# Patient Record
Sex: Male | Born: 1935 | Race: White | Hispanic: No | Smoking: Never smoker
Health system: Southern US, Community
[De-identification: ages and names within clinical notes are randomized; demographics above are authoritative.]

## PROBLEM LIST (undated history)

## (undated) DIAGNOSIS — E039 Hypothyroidism, unspecified: Secondary | ICD-10-CM

## (undated) DIAGNOSIS — I951 Orthostatic hypotension: Secondary | ICD-10-CM

## (undated) DIAGNOSIS — I10 Essential (primary) hypertension: Secondary | ICD-10-CM

## (undated) DIAGNOSIS — D759 Disease of blood and blood-forming organs, unspecified: Secondary | ICD-10-CM

## (undated) DIAGNOSIS — F0390 Unspecified dementia without behavioral disturbance: Secondary | ICD-10-CM

## (undated) DIAGNOSIS — K2970 Gastritis, unspecified, without bleeding: Secondary | ICD-10-CM

## (undated) DIAGNOSIS — J189 Pneumonia, unspecified organism: Secondary | ICD-10-CM

## (undated) DIAGNOSIS — C801 Malignant (primary) neoplasm, unspecified: Secondary | ICD-10-CM

## (undated) DIAGNOSIS — Z7901 Long term (current) use of anticoagulants: Secondary | ICD-10-CM

## (undated) DIAGNOSIS — N400 Enlarged prostate without lower urinary tract symptoms: Secondary | ICD-10-CM

## (undated) DIAGNOSIS — I4891 Unspecified atrial fibrillation: Secondary | ICD-10-CM

## (undated) DIAGNOSIS — R0602 Shortness of breath: Secondary | ICD-10-CM

## (undated) HISTORY — PX: HERNIA REPAIR: SHX51

## (undated) HISTORY — DX: Essential (primary) hypertension: I10

## (undated) HISTORY — DX: Orthostatic hypotension: I95.1

## (undated) HISTORY — PX: KNEE SURGERY: SHX244

## (undated) HISTORY — PX: APPENDECTOMY: SHX54

## (undated) HISTORY — DX: Unspecified atrial fibrillation: I48.91

## (undated) HISTORY — DX: Benign prostatic hyperplasia without lower urinary tract symptoms: N40.0

## (undated) HISTORY — DX: Long term (current) use of anticoagulants: Z79.01

## (undated) HISTORY — PX: MIDDLE EAR SURGERY: SHX713

## (undated) HISTORY — DX: Gastritis, unspecified, without bleeding: K29.70

## (undated) HISTORY — DX: Malignant (primary) neoplasm, unspecified: C80.1

## (undated) HISTORY — DX: Hypothyroidism, unspecified: E03.9

---

## 1997-06-24 ENCOUNTER — Other Ambulatory Visit: Admission: RE | Admit: 1997-06-24 | Discharge: 1997-06-24 | Payer: Self-pay | Admitting: Cardiology

## 2003-09-04 HISTORY — PX: CARDIOVASCULAR STRESS TEST: SHX262

## 2004-04-12 ENCOUNTER — Encounter: Payer: Self-pay | Admitting: Cardiology

## 2004-04-12 ENCOUNTER — Observation Stay (HOSPITAL_COMMUNITY): Admission: AD | Admit: 2004-04-12 | Discharge: 2004-04-13 | Payer: Self-pay | Admitting: Internal Medicine

## 2004-04-12 ENCOUNTER — Ambulatory Visit: Payer: Self-pay | Admitting: Internal Medicine

## 2004-11-11 ENCOUNTER — Ambulatory Visit: Payer: Self-pay | Admitting: Internal Medicine

## 2004-11-18 ENCOUNTER — Ambulatory Visit: Payer: Self-pay | Admitting: Internal Medicine

## 2004-11-22 ENCOUNTER — Ambulatory Visit: Payer: Self-pay | Admitting: Internal Medicine

## 2004-11-23 ENCOUNTER — Ambulatory Visit: Payer: Self-pay | Admitting: Internal Medicine

## 2004-11-24 ENCOUNTER — Ambulatory Visit: Payer: Self-pay | Admitting: Internal Medicine

## 2004-12-09 ENCOUNTER — Ambulatory Visit: Payer: Self-pay | Admitting: Gastroenterology

## 2004-12-15 ENCOUNTER — Encounter (INDEPENDENT_AMBULATORY_CARE_PROVIDER_SITE_OTHER): Payer: Self-pay | Admitting: *Deleted

## 2004-12-15 ENCOUNTER — Ambulatory Visit: Payer: Self-pay | Admitting: Gastroenterology

## 2005-01-04 ENCOUNTER — Ambulatory Visit: Payer: Self-pay | Admitting: Gastroenterology

## 2006-01-04 ENCOUNTER — Ambulatory Visit: Payer: Self-pay | Admitting: Internal Medicine

## 2006-01-04 LAB — CONVERTED CEMR LAB
ALT: 5 units/L (ref 0–40)
AST: 18 units/L (ref 0–37)
Basophils Absolute: 0 10*3/uL (ref 0.0–0.1)
Creatinine, Ser: 1.1 mg/dL (ref 0.4–1.5)
HCT: 42.5 % (ref 39.0–52.0)
Hemoglobin: 14.3 g/dL (ref 13.0–17.0)
Hgb A1c MFr Bld: 5.4 % (ref 4.6–6.0)
MCV: 94 fL (ref 78.0–100.0)
Monocytes Relative: 9.6 % (ref 3.0–11.0)
RBC: 4.52 M/uL (ref 4.22–5.81)
RDW: 12.4 % (ref 11.5–14.6)

## 2006-02-15 ENCOUNTER — Encounter: Admission: RE | Admit: 2006-02-15 | Discharge: 2006-05-16 | Payer: Self-pay | Admitting: Internal Medicine

## 2006-04-17 ENCOUNTER — Ambulatory Visit: Payer: Self-pay | Admitting: Internal Medicine

## 2006-10-11 ENCOUNTER — Encounter: Payer: Self-pay | Admitting: Internal Medicine

## 2007-01-01 ENCOUNTER — Ambulatory Visit: Payer: Self-pay | Admitting: Internal Medicine

## 2007-01-01 DIAGNOSIS — J309 Allergic rhinitis, unspecified: Secondary | ICD-10-CM | POA: Insufficient documentation

## 2007-01-01 DIAGNOSIS — E875 Hyperkalemia: Secondary | ICD-10-CM | POA: Insufficient documentation

## 2007-01-01 DIAGNOSIS — Z87898 Personal history of other specified conditions: Secondary | ICD-10-CM

## 2007-01-01 DIAGNOSIS — G20 Parkinson's disease: Secondary | ICD-10-CM

## 2007-01-01 DIAGNOSIS — R42 Dizziness and giddiness: Secondary | ICD-10-CM

## 2007-04-11 ENCOUNTER — Encounter: Payer: Self-pay | Admitting: Internal Medicine

## 2007-10-10 ENCOUNTER — Encounter: Payer: Self-pay | Admitting: Internal Medicine

## 2008-03-05 ENCOUNTER — Ambulatory Visit: Payer: Self-pay | Admitting: Internal Medicine

## 2008-03-05 DIAGNOSIS — R1032 Left lower quadrant pain: Secondary | ICD-10-CM

## 2008-03-05 LAB — CONVERTED CEMR LAB
Glucose, Urine, Semiquant: NEGATIVE
Nitrite: NEGATIVE
Urobilinogen, UA: 0.2
WBC Urine, dipstick: NEGATIVE
pH: 6

## 2008-04-10 ENCOUNTER — Encounter: Payer: Self-pay | Admitting: Internal Medicine

## 2008-10-09 ENCOUNTER — Encounter: Payer: Self-pay | Admitting: Internal Medicine

## 2008-11-03 ENCOUNTER — Ambulatory Visit: Payer: Self-pay | Admitting: Internal Medicine

## 2008-11-03 DIAGNOSIS — Z85828 Personal history of other malignant neoplasm of skin: Secondary | ICD-10-CM

## 2008-11-03 DIAGNOSIS — E039 Hypothyroidism, unspecified: Secondary | ICD-10-CM | POA: Insufficient documentation

## 2008-11-03 DIAGNOSIS — G479 Sleep disorder, unspecified: Secondary | ICD-10-CM | POA: Insufficient documentation

## 2008-11-03 DIAGNOSIS — Z860100 Personal history of colon polyps, unspecified: Secondary | ICD-10-CM | POA: Insufficient documentation

## 2008-11-03 DIAGNOSIS — R634 Abnormal weight loss: Secondary | ICD-10-CM | POA: Insufficient documentation

## 2008-11-03 DIAGNOSIS — N400 Enlarged prostate without lower urinary tract symptoms: Secondary | ICD-10-CM | POA: Insufficient documentation

## 2008-11-03 DIAGNOSIS — I4891 Unspecified atrial fibrillation: Secondary | ICD-10-CM

## 2008-11-04 ENCOUNTER — Ambulatory Visit: Payer: Self-pay | Admitting: Internal Medicine

## 2008-11-04 LAB — CONVERTED CEMR LAB
ALT: 18 units/L (ref 0–53)
Albumin: 4.1 g/dL (ref 3.5–5.2)
Alkaline Phosphatase: 54 units/L (ref 39–117)
Basophils Relative: 0.1 % (ref 0.0–3.0)
Bilirubin, Direct: 0.3 mg/dL (ref 0.0–0.3)
Calcium: 9.1 mg/dL (ref 8.4–10.5)
Cholesterol: 176 mg/dL (ref 0–200)
GFR calc non Af Amer: 56.93 mL/min (ref >60)
Glucose, Bld: 101 mg/dL — ABNORMAL HIGH (ref 70–99)
Hemoglobin: 15.9 g/dL (ref 13.0–17.0)
LDL Cholesterol: 108 mg/dL — ABNORMAL HIGH (ref 0–99)
Lymphocytes Relative: 10.8 % — ABNORMAL LOW (ref 12.0–46.0)
Platelets: 171 10*3/uL (ref 150.0–400.0)
RDW: 12 % (ref 11.5–14.6)
Sodium: 145 meq/L (ref 135–145)
TSH: 1.53 microintl units/mL (ref 0.35–5.50)
Total Bilirubin: 2.4 mg/dL — ABNORMAL HIGH (ref 0.3–1.2)
Total Protein: 7.2 g/dL (ref 6.0–8.3)
Triglycerides: 71 mg/dL (ref 0.0–149.0)
VLDL: 14.2 mg/dL (ref 0.0–40.0)

## 2008-11-05 ENCOUNTER — Telehealth (INDEPENDENT_AMBULATORY_CARE_PROVIDER_SITE_OTHER): Payer: Self-pay | Admitting: *Deleted

## 2008-11-18 ENCOUNTER — Ambulatory Visit: Payer: Self-pay | Admitting: Internal Medicine

## 2008-11-18 LAB — CONVERTED CEMR LAB: OCCULT 1: NEGATIVE

## 2008-11-19 ENCOUNTER — Encounter (INDEPENDENT_AMBULATORY_CARE_PROVIDER_SITE_OTHER): Payer: Self-pay | Admitting: *Deleted

## 2008-11-25 ENCOUNTER — Ambulatory Visit: Payer: Self-pay | Admitting: Internal Medicine

## 2008-11-26 ENCOUNTER — Encounter: Payer: Self-pay | Admitting: Internal Medicine

## 2009-06-08 ENCOUNTER — Ambulatory Visit: Payer: Self-pay | Admitting: Internal Medicine

## 2009-06-09 ENCOUNTER — Telehealth (INDEPENDENT_AMBULATORY_CARE_PROVIDER_SITE_OTHER): Payer: Self-pay | Admitting: *Deleted

## 2009-06-09 ENCOUNTER — Encounter: Payer: Self-pay | Admitting: Internal Medicine

## 2009-06-11 ENCOUNTER — Telehealth (INDEPENDENT_AMBULATORY_CARE_PROVIDER_SITE_OTHER): Payer: Self-pay | Admitting: *Deleted

## 2009-06-11 LAB — CONVERTED CEMR LAB
BUN: 15 mg/dL (ref 6–23)
Bilirubin, Direct: 0.2 mg/dL (ref 0.0–0.3)
CO2: 30 meq/L (ref 19–32)
Chloride: 107 meq/L (ref 96–112)
Creatinine, Ser: 1.1 mg/dL (ref 0.4–1.5)
Lymphocytes Relative: 50.4 % — ABNORMAL HIGH (ref 12.0–46.0)
Monocytes Relative: 23.2 % — ABNORMAL HIGH (ref 3.0–12.0)
Neutrophils Relative %: 24.6 % — ABNORMAL LOW (ref 43.0–77.0)
Potassium: 4 meq/L (ref 3.5–5.1)
RDW: 13.2 % (ref 11.5–14.6)
Total Bilirubin: 1.2 mg/dL (ref 0.3–1.2)

## 2009-06-15 ENCOUNTER — Ambulatory Visit: Payer: Self-pay | Admitting: Internal Medicine

## 2009-06-16 ENCOUNTER — Encounter: Payer: Self-pay | Admitting: Internal Medicine

## 2009-06-16 DIAGNOSIS — R930 Abnormal findings on diagnostic imaging of skull and head, not elsewhere classified: Secondary | ICD-10-CM

## 2009-06-16 LAB — CONVERTED CEMR LAB
Basophils Absolute: 0.1 10*3/uL (ref 0.0–0.1)
Eosinophils Absolute: 0 10*3/uL (ref 0.0–0.7)
Hemoglobin: 13.4 g/dL (ref 13.0–17.0)
Lymphs Abs: 1.3 10*3/uL (ref 0.7–4.0)
MCHC: 34.7 g/dL (ref 30.0–36.0)
Neutro Abs: 4.5 10*3/uL (ref 1.4–7.7)
RBC: 4.12 M/uL — ABNORMAL LOW (ref 4.22–5.81)
RDW: 13.2 % (ref 11.5–14.6)
WBC: 6.4 10*3/uL (ref 4.5–10.5)

## 2009-06-17 ENCOUNTER — Encounter: Payer: Self-pay | Admitting: Internal Medicine

## 2009-06-19 ENCOUNTER — Ambulatory Visit: Payer: Self-pay | Admitting: Cardiology

## 2009-06-23 ENCOUNTER — Telehealth (INDEPENDENT_AMBULATORY_CARE_PROVIDER_SITE_OTHER): Payer: Self-pay | Admitting: *Deleted

## 2009-06-30 ENCOUNTER — Telehealth (INDEPENDENT_AMBULATORY_CARE_PROVIDER_SITE_OTHER): Payer: Self-pay | Admitting: *Deleted

## 2009-07-03 ENCOUNTER — Ambulatory Visit: Payer: Self-pay | Admitting: Internal Medicine

## 2009-07-03 DIAGNOSIS — J189 Pneumonia, unspecified organism: Secondary | ICD-10-CM

## 2009-07-03 LAB — CONVERTED CEMR LAB: INR: 2.9

## 2009-07-22 ENCOUNTER — Encounter: Payer: Self-pay | Admitting: Internal Medicine

## 2009-07-31 ENCOUNTER — Ambulatory Visit: Payer: Self-pay | Admitting: Internal Medicine

## 2009-07-31 ENCOUNTER — Telehealth (INDEPENDENT_AMBULATORY_CARE_PROVIDER_SITE_OTHER): Payer: Self-pay | Admitting: *Deleted

## 2009-08-11 ENCOUNTER — Encounter: Payer: Self-pay | Admitting: Internal Medicine

## 2009-08-19 ENCOUNTER — Encounter: Payer: Self-pay | Admitting: Internal Medicine

## 2009-08-26 ENCOUNTER — Ambulatory Visit (HOSPITAL_COMMUNITY): Admission: RE | Admit: 2009-08-26 | Discharge: 2009-08-26 | Payer: Self-pay | Admitting: Internal Medicine

## 2009-09-09 ENCOUNTER — Ambulatory Visit: Payer: Self-pay | Admitting: Cardiology

## 2009-09-14 ENCOUNTER — Ambulatory Visit: Payer: Self-pay | Admitting: Internal Medicine

## 2009-09-15 ENCOUNTER — Telehealth (INDEPENDENT_AMBULATORY_CARE_PROVIDER_SITE_OTHER): Payer: Self-pay | Admitting: *Deleted

## 2009-09-15 ENCOUNTER — Encounter: Payer: Self-pay | Admitting: Internal Medicine

## 2009-09-17 ENCOUNTER — Encounter: Payer: Self-pay | Admitting: Internal Medicine

## 2009-09-18 ENCOUNTER — Ambulatory Visit: Payer: Self-pay | Admitting: Internal Medicine

## 2009-09-21 ENCOUNTER — Encounter: Payer: Self-pay | Admitting: Gastroenterology

## 2009-09-25 ENCOUNTER — Encounter (INDEPENDENT_AMBULATORY_CARE_PROVIDER_SITE_OTHER): Payer: Self-pay | Admitting: *Deleted

## 2009-10-02 ENCOUNTER — Ambulatory Visit: Payer: Self-pay | Admitting: Cardiology

## 2009-10-08 ENCOUNTER — Encounter: Payer: Self-pay | Admitting: Internal Medicine

## 2009-10-19 ENCOUNTER — Ambulatory Visit: Payer: Self-pay | Admitting: Gastroenterology

## 2009-10-19 ENCOUNTER — Ambulatory Visit: Payer: Self-pay | Admitting: Internal Medicine

## 2009-10-19 DIAGNOSIS — R1310 Dysphagia, unspecified: Secondary | ICD-10-CM | POA: Insufficient documentation

## 2009-10-19 DIAGNOSIS — T887XXA Unspecified adverse effect of drug or medicament, initial encounter: Secondary | ICD-10-CM | POA: Insufficient documentation

## 2009-10-19 DIAGNOSIS — D68318 Other hemorrhagic disorder due to intrinsic circulating anticoagulants, antibodies, or inhibitors: Secondary | ICD-10-CM | POA: Insufficient documentation

## 2009-10-20 ENCOUNTER — Telehealth: Payer: Self-pay | Admitting: Gastroenterology

## 2009-10-28 ENCOUNTER — Ambulatory Visit: Payer: Self-pay | Admitting: Internal Medicine

## 2009-10-29 ENCOUNTER — Ambulatory Visit: Payer: Self-pay | Admitting: Cardiology

## 2009-11-12 ENCOUNTER — Ambulatory Visit: Payer: Self-pay | Admitting: Gastroenterology

## 2009-11-16 ENCOUNTER — Encounter: Payer: Self-pay | Admitting: Gastroenterology

## 2009-11-20 ENCOUNTER — Ambulatory Visit: Payer: Self-pay | Admitting: Cardiology

## 2009-12-08 ENCOUNTER — Ambulatory Visit: Payer: Self-pay | Admitting: Cardiology

## 2009-12-22 ENCOUNTER — Ambulatory Visit: Payer: Self-pay | Admitting: Cardiology

## 2009-12-22 ENCOUNTER — Telehealth (INDEPENDENT_AMBULATORY_CARE_PROVIDER_SITE_OTHER): Payer: Self-pay | Admitting: *Deleted

## 2009-12-28 ENCOUNTER — Ambulatory Visit: Payer: Self-pay | Admitting: Gastroenterology

## 2009-12-28 DIAGNOSIS — K222 Esophageal obstruction: Secondary | ICD-10-CM

## 2009-12-29 ENCOUNTER — Ambulatory Visit: Payer: Self-pay | Admitting: Cardiology

## 2010-02-02 ENCOUNTER — Ambulatory Visit: Payer: Self-pay | Admitting: Cardiology

## 2010-02-15 ENCOUNTER — Encounter: Payer: Self-pay | Admitting: Internal Medicine

## 2010-03-03 ENCOUNTER — Ambulatory Visit: Payer: Self-pay | Admitting: Cardiovascular Disease

## 2010-03-11 NOTE — Assessment & Plan Note (Signed)
Summary: shingles vaccine/scm  Nurse Visit   Vitals Entered By: Shonna Chock CMA (October 28, 2009 8:25 AM)  Allergies: 1)  ! Sulfa  Immunizations Administered:  Zostavax # 1:    Vaccine Type: Zostavax    Site: Right Arm    Mfr: Merck    Dose: 0.2mL    Route: [place]    Given by: Shonna Chock CMA    Exp. Date: 09/25/2010    Lot #: [id number]    VIS given: 11/19/04 given October 28, 2009.  Orders Added: 1)  Zoster (Shingles) Vaccine Live [90736] 2)  Admin 1st Vaccine [phone]

## 2010-03-11 NOTE — Progress Notes (Signed)
Summary: CT Scan Results  Phone Note Outgoing Call Call back at [facility] Phone [phone]   Call placed by: Shonna Chock,  Jun 23, 2009 9:07 AM Details for Reason: CT SCAN Summary of Call: Thankfully the CT scan reveals only pneumonia type changes. Zithromax has been completed; Avelox 400 mg once daily X 5 days is recommended.  Chrae Malloy  Jun 23, 2009 9:07 AM   Follow-up for Phone Call        Spoke with patient, patient is on his third day of ABX. Patient would like to know if he should follow-up with Dr.Hopper or have a follow-up chest xray.  Dr.Hopper please advise Follow-up by: Shonna Chock,  Jun 23, 2009 1:17 PM  Additional Follow-up for Phone Call Additional follow up Details #1::        per dr hopper pt to schedule OV after completion of generic augmentin 10days supply. pt aware.Marti Sleigh Deloach CMA  Jun 23, 2009 4:41 PM

## 2010-03-11 NOTE — Letter (Signed)
Summary: Patient Notice- Polyp Results  [facility]  [address] [place], [place] [address]   Phone: [phone]  Fax: [phone]        November 16, 2009 MRN: [id number]    [facility] [address] [place], [place]  [address]    Dear Mr. Colunga,  I am pleased to inform you that the colon polyp(s) removed during your recent colonoscopy was (were) found to be benign (no cancer detected) upon pathologic examination.  I recommend you have a repeat colonoscopy examination in _ years to look for recurrent polyps, as having colon polyps increases your risk for having recurrent polyps or even colon cancer in the future.  Should you develop new or worsening symptoms of abdominal pain, bowel habit changes or bleeding from the rectum or bowels, please schedule an evaluation with either your primary care physician or with me.  Additional information/recommendations:  _x_ No further action with gastroenterology is needed at this time. Please      follow-up with your primary care physician for your other healthcare      needs.  __ Please call [phone] to schedule a return visit to review your      situation.  __ Please keep your follow-up visit as already scheduled.  __ Continue treatment plan as outlined the day of your exam.  Please call us if you are having persistent problems or have questions about your condition that have not been fully answered at this time.  Sincerely,  Louis Meckel MD  This letter has been electronically signed by your physician.  Appended Document: Patient Notice- Polyp Results letter mailed

## 2010-03-11 NOTE — Consult Note (Signed)
Summary: [facility]  [facility]   Imported By: Lanelle Bal 10/21/2009 11:05:18  _____________________________________________________________________  External Attachment:    Type:   Image     Comment:   External Document

## 2010-03-11 NOTE — Consult Note (Signed)
Summary: [facility]  [facility]   Imported By: Lanelle Bal 02/26/2010 09:07:32  _____________________________________________________________________  External Attachment:    Type:   Image     Comment:   External Document

## 2010-03-11 NOTE — Miscellaneous (Signed)
Summary: Orders Update   Clinical Lists Changes  Problems: Added new problem of CHEST XRAY, ABNORMAL (ICD-793.1) Orders: Added new Referral order of Radiology Referral (Radiology) - Signed 

## 2010-03-11 NOTE — Assessment & Plan Note (Signed)
Summary: f/u from procedure--ch.   History of Present Illness Visit Type: Follow-up Visit Primary GI MD: Melvia Heaps MD [facility] Primary Provider: Marga Melnick, MD Requesting Provider: na Chief Complaint: F/u from endo/colon. Pt states he is fine and denies any GI complaints  History of Present Illness:   Carlos Gonzalez has returned following upper and lower endoscopy.  A distal esophageal stricture was dilated.  An adenomatous polyp was removed from the splenic flexure.  Since dilatation his dysphagia has entirely resolved.  He is eating well and has gained weight.  He has no lower GI complaints.   GI Review of Systems    Reports dysphagia with liquids.      Denies abdominal pain, acid reflux, belching, bloating, chest pain, dysphagia with solids, heartburn, loss of appetite, nausea, vomiting, vomiting blood, weight loss, and  weight gain.        Denies anal fissure, black tarry stools, change in bowel habit, constipation, diarrhea, diverticulosis, fecal incontinence, heme positive stool, hemorrhoids, irritable bowel syndrome, jaundice, light color stool, liver problems, rectal bleeding, and  rectal pain.    Current Medications (verified): 1)  Requip 3 Mg  Tabs (Ropinirole Hcl) .... 3 Times Per Day 2)  Carbidopa-Levodopa 25-100 Mg  Tabs (Carbidopa-Levodopa) .Marland Kitchen.. 1 By Mouth Tid 3)  Seligiline Hcl 5 Mg .Marland Kitchen.. 1 By Mouth Qd 4)  Digoxin 250 Mg Tabs Lnt (Digoxin) .... 1/2 Tablet Per Day 5)  Multivitamins   Tabs (Multiple Vitamin) .... Take 1 Tablet By Mouth Daily 6)  Warfarin Sodium 5 Mg Tabs (Warfarin Sodium) .Marland Kitchen.. 1 Tablet Tues-Thurs-Sat, and 1 1/2 Tablet On Mon-Wed-Fri-Sun 7)  Levothyroxine Sodium 100 Mcg Tabs (Levothyroxine Sodium) .... One Tablet By Mouth Once Daily 8)  Clonazepam 0.5 Mg Tabs (Clonazepam) .... Take 1/2 Tab At Bedtime 9)  Mucinex Dm 30-600 Mg Xr12h-Tab (Dextromethorphan-Guaifenesin) .... As Needed 10)  Citalopram Hydrobromide 20 Mg Tabs (Citalopram Hydrobromide) .... 1/2  Tablet Po Once Daily 11)  Fludrocortisone 0.1mg  .... 1/2 Tablet Per Day in The Morning 12)  Claritin 10 Mg Tabs (Loratadine) .... One Tablet By Mouth Once Daily  Allergies (verified): 1)  ! Sulfa  Past History:  Past Medical History: Parkinson's  Disease ; Gilbert's Syndrome Atrial fibrillation Hypothyroidism Benign prostatic hypertrophy Colonic polyps, hx of, adenomatous , Dr Arlyce Dice  2006 Skin cancer, hx of, ? Basal cell , Dr Mayford Knife 1 cm sessile polyp at the splenic flexure Gastritis   Past Surgical History: Reviewed history from 10/19/2009 and no changes required. Appendectomy Hernia  @ age 48 & @ 61 & bilaterally in 1997 Knee surgery for torn cartilage PAF (March 2006) Coloscopy (2006) tubular adenoma Left Eustachian tube dysfunction due to childhood infections;replacement middle ear bones, graft to TM with cartilage ;Total of 3 ear surgeries L ear, Dr Lyman Bishop Ear  prosthesis (1985)  Family History: Reviewed history from 10/19/2009 and no changes required. Father: CHF Mother: Alsheimer's  Siblings: brother-heart bypass, lung CA PGF:  MI No FH of Colon Cancer:  Social History: Reviewed history from 10/19/2009 and no changes required. Occupation: Real Estate Investor--Retired Married Childern Never Smoked Alcohol use-yes: socially Regular exercise-no Daily Caffeine Use: one daily  Illicit Drug Use - no  Review of Systems  The patient denies allergy/sinus, anemia, anxiety-new, arthritis/joint pain, back pain, blood in urine, breast changes/lumps, change in vision, confusion, cough, coughing up blood, depression-new, fainting, fatigue, fever, headaches-new, hearing problems, heart murmur, heart rhythm changes, itching, muscle pains/cramps, night sweats, nosebleeds, shortness of breath, skin rash, sleeping problems, sore throat,  swelling of feet/legs, swollen lymph glands, thirst - excessive, urination - excessive, urination changes/pain, urine leakage, vision  changes, and voice change.    Vital Signs:  Patient profile:   77 year old male Height:      73 inches Weight:      166 pounds BMI:     21.98 BSA:     1.99 Pulse rate:   64 / minute Pulse rhythm:   regular BP sitting:   100 / 64  (left arm) Cuff size:   regular  Vitals Entered By: Ok Anis CMA (December 28, 2009 1:28 PM)   Impression & Recommendations:  Problem # 1:  STRICTURE AND STENOSIS OF ESOPHAGUS (ICD-530.3) Assessment Improved Plan repeat dilatation p.r.n.  Problem # 2:  PERSONAL HISTORY OF COLONIC POLYPS (ICD-V12.72) Given the patient's comorbidities I would not repeat his colonoscopy routinely, in the absence of specific complaints.  Patient Instructions: 1)  Copy sent to : Marga Melnick, MD 2)  Follow up as needed  3)  The medication list was reviewed and reconciled.  All changed / newly prescribed medications were explained.  A complete medication list was provided to the patient / caregiver.

## 2010-03-11 NOTE — Assessment & Plan Note (Signed)
Summary: BAD, DEEP COUGH/RH.......Marland Kitchen   Vital Signs:  Patient profile:   77 year old male Weight:      161.6 pounds BMI:     21.40 Temp:     97.6 degrees F oral Pulse rate:   94 / minute Resp:     18 per minute BP sitting:   104 / 68  (left arm) Cuff size:   regular  Vitals Entered By: Shonna Chock (Jun 08, 2009 1:49 PM) CC: 1.) Cough, Non-productive x couple weeks 2.) Light-Headed x couple days 3.) Weight loss and no appetite Comments REVIEWED MED LIST, PATIENT AGREED DOSE AND INSTRUCTION CORRECT  Currently on Azithromycin x 5 days (? Dose)   CC:  1.) Cough and Non-productive x couple weeks 2.) Light-Headed x couple days 3.) Weight loss and no appetite.  History of Present Illness: He has had  a cough for 2 weeks with scant, clear  sputum , shortness of breath, wheezing, exertional dyspnea, and malaise.He denies pleuritic chest pain, fever, and hemoptysis.  Associated symtpoms include chronic rhinitis, weight loss(chronic issue in context of Parkinson's GI symptoms), and peripheral edema. Support hose control edema. The patient denies the following symptoms: cold/URI symptoms, sore throat, nasal congestion, and acid reflux symptoms. Effective prior treatments have included OTC cough medication( Mucinex) & Zithromax Rxed by Dr Patty Sermons 06/05/2009.  No diagnostic testing to date; he had a  CXR several months ago.   Anorexia X years, worse in past few weeks. Lightheadedness since 05/01with unsteady gait.He is seeing a Human resources officer for dysphagia & speech dysfunction related to Parkinson's as per Dr Azucena Cecil Scott's order.  Allergies: 1)  ! Sulfa  Review of Systems Neuro:  Tremor increasing as to frequency. Psych:  Denies anxiety and depression.  Physical Exam  General:  Thin but in no acute distress; alert,appropriate and cooperative throughout examination Eyes:  No corneal or conjunctival inflammation noted. EOMI. Perrla.No sustained nystagmus Ears:  External ear exam shows no  significant lesions or deformities.  Otoscopic examination reveals clear canals, tympanic membranes are intact bilaterally without bulging, retraction, inflammation or discharge. Hearing is grossly normal bilaterally.TMs scarred, L >R Nose:  External nasal examination shows no deformity or inflammation. Nasal mucosa are pink and moist without lesions or exudates. R  septal dislocation Mouth:  Oral mucosa and oropharynx without lesions or exudates.  No pharyngeal erythema.   Neck:  No deformities, masses, or tenderness noted.Thyroid small Lungs:  Normal respiratory effort, chest expands symmetrically. Lungs are clear to auscultation, no crackles or wheezes. Decreased BS Heart:  irregular rhythm.   Flow murmur Abdomen:  Bowel sounds positive,abdomen soft and non-tender without masses, organomegaly or hernias noted. Aorta palpable w/o AAA Pulses:  R and L carotid,radial, pulses are full and equal bilaterally. Decreased pedal pulses Neurologic:  Mask facies; tremor LUE .DTRs WNL Skin:  Keratoses esp of scalp Cervical Nodes:  No lymphadenopathy noted Axillary Nodes:  No palpable lymphadenopathy Psych:  memory intact for recent and remote and flat affect.     Impression & Recommendations:  Problem # 1:  BRONCHITIS-ACUTE (ICD-466.0)  cough X 2 weeks The following medications were removed from the medication list:    Amoxicillin-pot Clavulanate 500-125 Mg Tabs (Amoxicillin-pot clavulanate) .Marland Kitchen... 1 q 12 hrs with a meal His updated medication list for this problem includes:    Mucinex Dm 30-600 Mg Xr12h-tab (Dextromethorphan-guaifenesin) .Marland Kitchen... As needed  Orders: Venipuncture (40981) TLB-CBC Platelet - w/Differential (85025-CBCD) T-2 View CXR (71020TC)  Problem # 2:  DIZZINESS (ICD-780.4)  probably related to poor nutrition  Orders: Venipuncture (36644)  Problem # 3:  WEIGHT LOSS (ICD-783.21) chronic, progressive, probabbly due to GI symptoms with advanced  Parkinson's Orders: Venipuncture (03474) TLB-BMP (Basic Metabolic Panel-BMET) (80048-METABOL) TLB-CBC Platelet - w/Differential (85025-CBCD) TLB-Hepatic/Liver Function Pnl (80076-HEPATIC)  Problem # 4:  ATRIAL FIBRILLATION (ICD-427.31) as per Dr Patty Sermons His updated medication list for this problem includes:    Digoxin 0.25 Mg Tabs (Digoxin) .Marland Kitchen... 1 by mouth qd    Warfarin Sodium 5 Mg Tabs (Warfarin sodium) .Marland Kitchen... 1 by mouth once daily    Amiodarone Hcl 200 Mg Tabs (Amiodarone hcl) .Marland Kitchen... 1 by mouth once daily  Problem # 5:  PARKINSON'S DISEASE (ICD-332.0) as per Dr Lorin Picket, [facility]  Complete Medication List: 1)  Requip 3 Mg Tabs (Ropinirole hcl) .... 3 times per day 2)  Carbidopa-levodopa 25-100 Mg Tabs (Carbidopa-levodopa) .Marland Kitchen.. 1 by mouth tid 3)  Seligiline Hcl 5 Mg  .Marland KitchenMarland Kitchen. 1 by mouth qd 4)  Digoxin 0.25 Mg Tabs (Digoxin) .Marland Kitchen.. 1 by mouth qd 5)  Multivitamins Tabs (Multiple vitamin) .... Take 1 tablet by mouth daily 6)  Warfarin Sodium 5 Mg Tabs (Warfarin sodium) .Marland Kitchen.. 1 by mouth once daily 7)  Amiodarone Hcl 200 Mg Tabs (Amiodarone hcl) .Marland Kitchen.. 1 by mouth once daily 8)  Levothyroxine Sodium 25 Mcg Tabs (Levothyroxine sodium) .Marland Kitchen.. 1 by mouth q d 9)  Clonazepam 0.5 Mg Tabs (Clonazepam) .... Take 1/2 tab at bedtime 10)  Mucinex Dm 30-600 Mg Xr12h-tab (Dextromethorphan-guaifenesin) .... As needed 11)  Citalopram Hydrobromide 20 Mg Tabs (Citalopram hydrobromide) .... 1/2 pill once daily ; check pt/inr 1 week after starting this  Other Orders: TLB-TSH (Thyroid Stimulating Hormone) (25956-LOV)  Patient Instructions: 1)  Share corrected record with all MDs seen. Prescriptions: CITALOPRAM HYDROBROMIDE 20 MG TABS (CITALOPRAM HYDROBROMIDE) 1/2 pill once daily ; check PT/INR 1 week after starting this  #30 x 2   Entered and Authorized by:   Marga Melnick MD   Signed by:   Marga Melnick MD on 06/08/2009   Method used:   Printed then faxed to ...       [facility] [address] (retail)       [address]       [place], [place]  [address]       Ph: [phone] or [phone]       Fax: [phone]   RxID:   [id number]

## 2010-03-11 NOTE — Assessment & Plan Note (Signed)
Summary: ESOPHAGEAL STRICTURE/YF   History of Present Illness Visit Type: consult  Primary GI MD: Melvia Heaps MD [facility] Primary Provider: Marga Melnick, MD Requesting Provider: Marga Melnick, MD Chief Complaint: Dysphagia with solids, loss of appetite, and weight loss History of Present Illness:   Carlos Gonzalez is a pleasant 77 year old white male referred at the request of Dr. Alwyn Ren for evaluation of dysphagia. He has had dysphagia to solids over the past year or so.  He has Parkinson's disease.  Recent modified barium swallow study demonstrated mild sensory motor oral pharyngeal dysphagia.  In addition, a barium tablet lodged in his mid esophagus suggesting a mid esophageal stricture.  He has lost 35 pounds over the past 2 years which he attributes  to his Parkinson's medications.  The patient also has a history of adenomatous polyp removed in 2006.  He is on Coumadin for atrial fibrillation.   GI Review of Systems    Reports dysphagia with solids, loss of appetite, and  weight loss.   Weight loss of 35 pounds over 2 years.   Denies abdominal pain, acid reflux, belching, bloating, chest pain, dysphagia with liquids, heartburn, nausea, vomiting, vomiting blood, and  weight gain.        Denies anal fissure, black tarry stools, change in bowel habit, constipation, diarrhea, diverticulosis, fecal incontinence, heme positive stool, hemorrhoids, irritable bowel syndrome, jaundice, light color stool, liver problems, rectal bleeding, and  rectal pain.    Current Medications (verified): 1)  Requip 3 Mg  Tabs (Ropinirole Hcl) .... 3 Times Per Day 2)  Carbidopa-Levodopa 25-100 Mg  Tabs (Carbidopa-Levodopa) .Marland Kitchen.. 1 By Mouth Tid 3)  Seligiline Hcl 5 Mg .Marland Kitchen.. 1 By Mouth Qd 4)  Digoxin 250 Mg Tabs Lnt (Digoxin) .... 1/2 Tablet Per Day 5)  Multivitamins   Tabs (Multiple Vitamin) .... Take 1 Tablet By Mouth Daily 6)  Warfarin Sodium 5 Mg Tabs (Warfarin Sodium) .Marland Kitchen.. 1 Tablet Tues-Thurs-Sat, and 1  1/2 Tablet On Mon-Wed-Fri-Sun 7)  Levothyroxine Sodium 100 Mcg Tabs (Levothyroxine Sodium) .... One Tablet By Mouth Once Daily 8)  Clonazepam 0.5 Mg Tabs (Clonazepam) .... Take 1/2 Tab At Bedtime 9)  Mucinex Dm 30-600 Mg Xr12h-Tab (Dextromethorphan-Guaifenesin) .... As Needed 10)  Citalopram Hydrobromide 20 Mg Tabs (Citalopram Hydrobromide) .... 1/2 Tablet Po Once Daily 11)  Fludrocortisone 0.1mg  .... 1/2 Tablet Per Day in The Morning 12)  Claritin 10 Mg Tabs (Loratadine) .... One Tablet By Mouth Once Daily  Allergies (verified): 1)  ! Sulfa  Past History:  Past Medical History: Reviewed history from 10/16/2009 and no changes required. Parkinson's  Disease ; Gilbert's Syndrome Atrial fibrillation Hypothyroidism Benign prostatic hypertrophy Colonic polyps, hx of, adenomatous , Dr Arlyce Dice  2006 Skin cancer, hx of, ? 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  The patient denies anemia, anxiety-new, back pain, blood in urine, breast changes/lumps, change in vision, confusion, coughing up blood, depression-new, fainting, fatigue, fever, headaches-new, hearing problems, heart murmur, heart rhythm changes,  itching, muscle pains/cramps, night sweats, nosebleeds, skin rash, sleeping problems, sore throat, swelling of feet/legs, swollen lymph glands, thirst - excessive, urination - excessive, urination changes/pain, urine leakage, vision changes, and voice change.         All other systems were reviewed and were negative   Vital Signs:  Patient profile:   77 year old male Height:      73 inches Weight:      160 pounds BMI:     21.19 BSA:     1.96 Pulse rate:   76 / minute Pulse rhythm:   regular BP sitting:   108 / 60  (left arm) Cuff size:   regular  Vitals Entered By: Ok Anis CMA (October 19, 2009 9:12 AM)  Physical Exam  Additional Exam:  He is a thin white male in no acute distress  skin: anicteric HEENT: normocephalic; PEERLA; no nasal or pharyngeal abnormalities neck: supple nodes: no cervical lymphadenopathy chest: clear to ausculatation and percussion heart: no murmurs, gallops, or rubs abd: soft, nontender; BS normoactive; no abdominal masses, tenderness, organomegaly rectal: deferred ext: no cynanosis, clubbing, edema skeletal: no deformities neuro: oriented x 3; no focal abnormalities    Impression & Recommendations:  Problem # 1:  PERSONAL HISTORY OF COLONIC POLYPS (ICD-V12.72)  Dysphagia is likely a combination of oral pharyngeal dysphagia and possible esophageal stricture.  Recommendations #1 upper endoscopy with dilatation as indicated.  Coumadin will be held and advanced to the procedure if approved by cardiology.  Risks, alternatives, and complications of the procedure, including bleeding, perforation, and possible need for surgery, were explained to the patient.  Patient's questions were answered.  Orders: Colon/Endo (Colon/Endo)  Problem # 2:  PERSONAL HISTORY OF COLONIC POLYPS (ICD-V12.72)  Plan followup colonoscopy at the time of his upper endoscopy while he is off anticoagulants  Orders: Colon/Endo (Colon/Endo)  Problem # 3:   COAGULOPATHY, COUMADIN-INDUCED (ICD-286.5) Assessment: Comment Only  Orders: Colon/Endo (Colon/Endo)  Problem # 4:  ATRIAL FIBRILLATION (ICD-427.31) Assessment: Comment Only  Problem # 5:  PARKINSON'S DISEASE (ICD-332.0) Assessment: Comment Only  Patient Instructions: 1)  Copy sent to : Marga Melnick, MD 2)  Colonoscopy and Flexible Sigmoidoscopy brochure given.  3)  Conscious Sedation brochure given.  4)  Upper Endoscopy with Dilatation brochure given.  5)  Your Colon/Endo is scheduled for 11/12/2009 at 2pm 6)  You can pick up your MoviPrep from your pharmacy today 7)  We will contact Dr Patty Sermons regarding your coumadin 8)  The medication list was reviewed and reconciled.  All changed / newly prescribed medications were explained.  A complete medication list was provided to the patient / caregiver. Prescriptions: MOVIPREP 100 GM  SOLR (PEG-KCL-NACL-NASULF-NA ASC-C) As per prep instructions.  #1 x 0   Entered by:   Merri Ray CMA (AAMA)   Authorized by:   Louis Meckel MD   Signed by:   Merri Ray CMA (AAMA) on 10/19/2009   Method used:   Electronically to        [facility] [phone][facility] (retail)       [address]       [place], [place]  [address]       Ph: [phone] or [phone]       Fax: [phone]  RxID:   [id number]

## 2010-03-11 NOTE — Procedures (Signed)
Summary: Colonoscopy  Patient: Carlos Gonzalez Note: All result statuses are Final unless otherwise noted.  Tests: (1) Colonoscopy (COL)   COL Colonoscopy           DONE     [facility]     [address]     [place], [place]  [address]           COLONOSCOPY PROCEDURE REPORT           PATIENT:  Carlos Gonzalez, Carlos Gonzalez  MR#:  [id number]     BIRTHDATE:  06/27/1930, 79 yrs. old  GENDER:  male           ENDOSCOPIST:  Barbette Hair. Arlyce Dice, MD     Referred by:  Marga Melnick, M.D.           PROCEDURE DATE:  11/12/2009     PROCEDURE:  Colonoscopy with snare polypectomy     ASA CLASS:  Class II     INDICATIONS:  1) screening  2) history of pre-cancerous     (adenomatous) colon polyps Index polypectomy 2006           MEDICATIONS:   Fentanyl 50 mcg IV, Versed 5 mg IV, glycopyrrolate     (Robinal) 0.2 mg IV, 0.6cc simethancone 0.6 cc PO           DESCRIPTION OF PROCEDURE:   After the risks benefits and     alternatives of the procedure were thoroughly explained, informed     consent was obtained.  Digital rectal exam was performed and     revealed no abnormalities.   The LB [id number] endoscope     was introduced through the anus and advanced to the cecum, which     was identified by the ileocecal valve, without limitations.  The     quality of the prep was Miralax fair.  The instrument was then     slowly withdrawn as the colon was fully examined.     <<PROCEDUREIMAGES>>           FINDINGS:  A sessile polyp was found at the splenic flexure. It     was 1 cm in size. Polyp was snared, then cauterized with monopolar     cautery. Retrieval was successful (see image15). snare polyp  This     was otherwise a normal examination of the colon (see image2,     image3, image4, image5, image8, image10, image13, image19, and     image20).   Retroflexed views in the rectum revealed no     abnormalities.    The time to cecum =  4.75  minutes. The scope     was then withdrawn (time =  10.50   min) from the patient and the     procedure completed.           COMPLICATIONS:  None           ENDOSCOPIC IMPRESSION:     1) 1 cm sessile polyp at the splenic flexure     2) Otherwise normal examination     RECOMMENDATIONS:     1) Return to the care of your primary provider. GI follow up as     needed (no f/u colo in view of age and comorbidities)     2) resume coumadin in am           REPEAT EXAM:  No           ______________________________     Barbette Hair.  Arlyce Dice, MD           CC:           n.     eSIGNED:   Barbette Hair. Kaplan at 11/12/2009 03:16 PM           Kunert, Nora, [id number]  Note: An exclamation mark (!) indicates a result that was not dispersed into the flowsheet. Document Creation Date: 11/12/2009 3:16 PM _______________________________________________________________________  (1) Order result status: Final Collection or observation date-time: 11/12/2009 15:02 Requested date-time:  Receipt date-time:  Reported date-time:  Referring Physician:   Ordering Physician: Melvia Heaps [phone]) Specimen Source:  Source: Launa Grill Order Number: [id number] Lab site:

## 2010-03-11 NOTE — Miscellaneous (Signed)
Summary: ST Recert/[facility]  ST Recert/[facility]   Imported By: Lanelle Bal 09/24/2009 09:59:25  _____________________________________________________________________  External Attachment:    Type:   Image     Comment:   External Document

## 2010-03-11 NOTE — Letter (Signed)
Summary: Results Letter  [facility]  [address] [place], [place] [address]   Phone: [phone]  Fax: [phone]        November 16, 2009 MRN: [id number]    [facility] [address] [place], [place]  [address]    Dear Carlos Gonzalez,   Your stomach biopsies demonstrated inflammatory changes only.    Please follow the recommendations previously discussed.  Should you have any immediate concerns or questions, feel free to contact me at the office.    Sincerely,  Barbette Hair. Arlyce Dice, M.D., [facility]          Sincerely,  Louis Meckel MD  This letter has been electronically signed by your physician.  Appended Document: Results Letter letter mailed

## 2010-03-11 NOTE — Letter (Signed)
Summary: New Patient letter  [facility]  [address] [place], [place] [address]   Phone: [phone]  Fax: [phone]       09/21/2009 MRN: [id number]  [facility] [address] [place], [place]  [address]  Dear Carlos Gonzalez,  Welcome to the Gastroenterology Division at [facility].    You are scheduled to see Dr.   Arlyce Dice on 10-19-09 at 9am on the 3rd floor at [facility], [address]  We ask that you try to arrive at our office 15 minutes prior to your appointment time to allow for check-in.  We would like you to complete the enclosed self-administered evaluation form prior to your visit and bring it with you on the day of your appointment.  We will review it with you.  Also, please bring a complete list of all your medications or, if you prefer, bring the medication bottles and we will list them.  Please bring your insurance card so that we may make a copy of it.  If your insurance requires a referral to see a specialist, please bring your referral form from your primary care physician.  Co-payments are due at the time of your visit and may be paid by cash, check or credit card.     Your office visit will consist of a consult with your physician (includes a physical exam), any laboratory testing he/she may order, scheduling of any necessary diagnostic testing (e.g. x-ray, ultrasound, CT-scan), and scheduling of a procedure (e.g. Endoscopy, Colonoscopy) if required.  Please allow enough time on your schedule to allow for any/all of these possibilities.    If you cannot keep your appointment, please call [phone] to cancel or reschedule prior to your appointment date.  This allows [place] the opportunity to schedule an appointment for another patient in need of care.  If you do not cancel or reschedule by 5 p.m. the business day prior to your appointment date, you will be charged a $50.00 late cancellation/no-show fee.    Thank you for  choosing  Gastroenterology for your medical needs.  We appreciate the opportunity to care for you.  Please visit [place] at our website  to learn more about our practice.                     Sincerely,                                                             The Gastroenterology Division

## 2010-03-11 NOTE — Miscellaneous (Signed)
Summary: ST Recert/[facility]  ST Recert/[facility]   Imported By: Lanelle Bal 08/27/2009 11:57:12  _____________________________________________________________________  External Attachment:    Type:   Image     Comment:   External Document

## 2010-03-11 NOTE — Miscellaneous (Signed)
Summary: Orders Update  Clinical Lists Changes  Orders: Added new Test order of T-2 View CXR (71020TC) - Signed 

## 2010-03-11 NOTE — Letter (Signed)
Summary: Anticoagulation Modification Letter  [facility]  [address] [place], [place] [address]   Phone: [phone]  Fax: [phone]    October 19, 2009  Re:    TEKOA AMON [facility] DOB:    02/20/1930 MRN:    [id number]    Dear Dr Patty Sermons,  We have scheduled the above patient for an endoscopic procedure. Our records show that  he/she is on anticoagulation therapy. Please advise as to how long the patient may come off their therapy of coumadin  prior to the scheduled procedure(s) on 11/12/2009   Please fax back/or route the completed form to Robin at [phone] Thank you for your help with this matter.  Sincerely,  Merri Ray CMA Duncan Dull)   Physician Recommendation:  Hold Plavix 7 days prior ________________  Hold Coumadin 5 days prior ____________  Other ______________________________

## 2010-03-11 NOTE — Progress Notes (Signed)
Summary: refill  Phone Note Refill Request Message from:  Fax from Pharmacy on December 22, 2009 8:49 AM  Refills Requested: Medication #1:  CITALOPRAM HYDROBROMIDE 20 MG TABS 1/2 tablet po once daily [facility] [address] - fax [phone]  Initial call taken by: Okey Regal [place],  December 22, 2009 8:50 AM    Prescriptions: CITALOPRAM HYDROBROMIDE 20 MG TABS (CITALOPRAM HYDROBROMIDE) 1/2 tablet po once daily  #15 x 5   Entered by:   Shonna Chock CMA   Authorized by:   Marga Melnick MD   Signed by:   Shonna Chock CMA on 12/22/2009   Method used:   [facility]        [facility] [phone][facility] (retail)       [address]       [place], [place]  [address]       Ph: [phone] or [phone]       Fax: [phone]   RxID:   [id number]

## 2010-03-11 NOTE — Letter (Signed)
Summary: Anticoagulation Modification Letter / [facility]  Anticoagulation Modification Letter / [facility]   Imported By: Lennie Odor 10/23/2009 14:26:44  _____________________________________________________________________  External Attachment:    Type:   Image     Comment:   External Document

## 2010-03-11 NOTE — Progress Notes (Signed)
Summary: Approved for 4 days to be off coumadin  Phone Note Outgoing Call Call back at [facility] Phone [phone]   Call placed by: Merri Ray CMA Duncan Dull),  October 20, 2009 10:37 AM Summary of Call: Called pt to inform it is okay that he come off coumadin but 4 days prior instead of five. L/M for pt and to call back if he has any questions. Will send approval to be scanned Initial call taken by: Merri Ray CMA Duncan Dull),  October 20, 2009 10:39 AM  Follow-up for Phone Call        ok Follow-up by: Louis Meckel MD,  October 20, 2009 11:00 AM

## 2010-03-11 NOTE — Assessment & Plan Note (Signed)
Summary: f/u pneumonia//fd   Vital Signs:  Patient profile:   77 year old male Weight:      156.4 pounds Temp:     98.3 degrees F oral Pulse rate:   60 / minute Resp:     17 per minute BP sitting:   104 / 68  (left arm) Cuff size:   regular  Vitals Entered By: Shonna Chock (Jul 03, 2009 10:47 AM) CC: 1.) Follow-up on Pneumonia-patient states "I feel better"  2.) Check PT/INR pre Dr.Brackbill's request Comments REVIEWED MED LIST, PATIENT AGREED DOSE AND INSTRUCTION CORRECT    CC:  1.) Follow-up on Pneumonia-patient states "I feel better"  2.) Check PT/INR pre Dr.Brackbill's request.  History of Present Illness: Cough has improved 80-90% better. Appetite improved with 1/2 Citalopram once daily .  Allergies: 1)  ! Sulfa  Review of Systems General:  Complains of weight loss; denies chills, fever, and sweats; Weight down 4 # despite improved appetite. ENT:  Complains of difficulty swallowing; denies hoarseness; Speech & Physical Therapy addressing  swallowing dysfunction. CV:  Complains of swelling of feet; denies difficulty breathing at night, difficulty breathing while lying down, and swelling of hands. Resp:  Denies chest pain with inspiration, coughing up blood, shortness of breath, and wheezing; Minimal sputum.  Physical Exam  General:  Thin but in no acute distress; alert,appropriate and cooperative throughout examination Lungs:  Normal respiratory effort, chest expands symmetrically. Lungs : minimal dry rales w/o increased WOB Heart:  bradycardia and irregular rhythm.   Psych:  memory intact for recent and remote, normally interactive, and good eye contact.     Impression & Recommendations:  Problem # 1:  PNEUMONIA, RIGHT LOWER LOBE (ICD-486) Probably from aspiration The following medications were removed from the medication list:    Amoxicillin-pot Clavulanate 875-125 Mg Tabs (Amoxicillin-pot clavulanate) .Marland Kitchen... 1 every 12 hrs with a meal  Problem # 2:  ATRIAL  FIBRILLATION (ICD-427.31)  His updated medication list for this problem includes:    Digoxin 0.25 Mg Tabs (Digoxin) .Marland Kitchen... 1 by mouth qd    Warfarin Sodium 5 Mg Tabs (Warfarin sodium) .Marland Kitchen... 1 by mouth once daily    Amiodarone Hcl 200 Mg Tabs (Amiodarone hcl) .Marland Kitchen... 1 by mouth once daily  Orders: Protime ([id number])  Complete Medication List: 1)  Requip 3 Mg Tabs (Ropinirole hcl) .... 3 times per day 2)  Carbidopa-levodopa 25-100 Mg Tabs (Carbidopa-levodopa) .Marland Kitchen.. 1 by mouth tid 3)  Seligiline Hcl 5 Mg  .Marland KitchenMarland Kitchen. 1 by mouth qd 4)  Digoxin 0.25 Mg Tabs (Digoxin) .Marland Kitchen.. 1 by mouth qd 5)  Multivitamins Tabs (Multiple vitamin) .... Take 1 tablet by mouth daily 6)  Warfarin Sodium 5 Mg Tabs (Warfarin sodium) .Marland Kitchen.. 1 by mouth once daily 7)  Amiodarone Hcl 200 Mg Tabs (Amiodarone hcl) .Marland Kitchen.. 1 by mouth once daily 8)  Levothyroxine Sodium 25 Mcg Tabs (Levothyroxine sodium) .Marland Kitchen.. 1 by mouth q d 9)  Clonazepam 0.5 Mg Tabs (Clonazepam) .... Take 1/2 tab at bedtime 10)  Mucinex Dm 30-600 Mg Xr12h-tab (Dextromethorphan-guaifenesin) .... As needed 11)  Citalopram Hydrobromide 20 Mg Tabs (Citalopram hydrobromide) .Marland Kitchen.. 1 once daily  Patient Instructions: 1)  Warfarin : 1/2 pill tomorrow then resume regular schedule.Recheck in 4 weeks. Continue Speech Therapy. Prescriptions: CITALOPRAM HYDROBROMIDE 20 MG TABS (CITALOPRAM HYDROBROMIDE) 1 once daily  #30 x 5   Entered and Authorized by:   Marga Melnick MD   Signed by:   Marga Melnick MD on 07/03/2009   Method used:  Faxed to ...       [facility] [phone]* (retail)       [address]       [place], [place]  [address]       Ph: [phone] or [phone]       Fax: [phone]   RxID:   [id number]    ANTICOAGULATION RECORD       NEW REGIMEN & LAB RESULTS Current INR: 2.9 Current Coumadin Dose(mg): 5mg  tabs, 1.5 Tabs M/W/F/Sun Regimen:   (no change)  Provider: Hopper,William MEDICATIONS REQUIP 3 MG  TABS (ROPINIROLE HCL) 3 TIMES per  day CARBIDOPA-LEVODOPA 25-100 MG  TABS (CARBIDOPA-LEVODOPA) 1 by mouth tid * SELIGILINE HCL 5 MG 1 by mouth qd DIGOXIN 0.25 MG  TABS (DIGOXIN) 1 by mouth qd MULTIVITAMINS   TABS (MULTIPLE VITAMIN) take 1 tablet by mouth daily WARFARIN SODIUM 5 MG TABS (WARFARIN SODIUM) 1 by mouth once daily AMIODARONE HCL 200 MG TABS (AMIODARONE HCL) 1 by mouth once daily LEVOTHYROXINE SODIUM 25 MCG TABS (LEVOTHYROXINE SODIUM) 1 by mouth Q D CLONAZEPAM 0.5 MG TABS (CLONAZEPAM) TAKE 1/2 TAB AT BEDTIME MUCINEX DM 30-600 MG XR12H-TAB (DEXTROMETHORPHAN-GUAIFENESIN) as needed CITALOPRAM HYDROBROMIDE 20 MG TABS (CITALOPRAM HYDROBROMIDE) 1 once daily   Anticoagulation Visit Questionnaire      Coumadin dose missed/changed:  No      Abnormal Bleeding Symptoms:  No   Any diet changes including alcohol intake, vegetables or greens since the last visit:  No

## 2010-03-11 NOTE — Assessment & Plan Note (Signed)
Summary: cough, clearing throat/cbs   Vital Signs:  Patient profile:   77 year old male Weight:      154 pounds O2 Sat:      98 % on Room air Temp:     98.6 degrees F oral Pulse rate:   77 / minute Pulse rhythm:   regular Resp:     17 per minute BP sitting:   98 / 62  (left arm)  Vitals Entered By: Almeta Monas CMA Duncan Dull) (September 18, 2009 10:53 AM)  O2 Flow:  Room air CC: c/o lack of energy and cough. Pt is concerned about pneumonia, Cough   CC:  c/o lack of energy and cough. Pt is concerned about pneumonia and Cough.  History of Present Illness:  Cough      This is a 77 year old man who presents with  chronic cough which  improved since recent PNA .  The patient reports productive cough with clear, thick  sputum, exertional dyspnea with stairs, and malaise. He  denies pleuritic chest pain, shortness of breath @ rest , wheezing, fever, and hemoptysis.  Associated symtpoms include chronic rhinitis, which also  has improved since PNA.  The patient denies the following symptoms: cold/URI symptoms, sore throat, nasal congestion, weight loss, and acid reflux symptoms.  The cough is worse with meals. He is being seen by Ms. Hizer , MS CCC-SLP , for swallowing dysfunction in context of his Parkinson's.  Risk factors include history of posible aspiration due to the neurologic condition. Ba swallow @ [facility] 3 weeks ago  revealed "trapping " @ laryngeal level requiring multiple swallows. A prior Ba swallow was done @ [facility] in 02/2009 ; he does not know results. Ms. Gerome Sam questions upper esophageal sphincter muscle dysfunction( "tight") & need for dilation.  Current Medications (verified): 1)  Requip 3 Mg  Tabs (Ropinirole Hcl) .... 3 Times Per Day 2)  Carbidopa-Levodopa 25-100 Mg  Tabs (Carbidopa-Levodopa) .Marland Kitchen.. 1 By Mouth Tid 3)  Seligiline Hcl 5 Mg .Marland Kitchen.. 1 By Mouth Qd 4)  Digoxin 250 Mg Tabs Lnt (Digoxin) .... 1/2 Tablet Per Day 5)  Multivitamins   Tabs (Multiple Vitamin) .... Take 1 Tablet By  Mouth Daily 6)  Warfarin Sodium 5 Mg Tabs (Warfarin Sodium) .Marland Kitchen.. 1 Tablet Tues-Thurs-Sat, and 1 1/2 Tablet On Mon-Wed-Fri-Sun 7)  Levothyroxine Sodium 25 Mcg Tabs (Levothyroxine Sodium) .Marland Kitchen.. 1 By Mouth Q D 8)  Clonazepam 0.5 Mg Tabs (Clonazepam) .... Take 1/2 Tab At Bedtime 9)  Mucinex Dm 30-600 Mg Xr12h-Tab (Dextromethorphan-Guaifenesin) .... As Needed 10)  Citalopram Hydrobromide 20 Mg Tabs (Citalopram Hydrobromide) .... 1/2 Tablet Po Once Daily 11)  Fludrocortisone 0.1mg  .... 1/2 Tablet Per Day in The Morning  Allergies (verified): 1)  ! Sulfa  Review of Systems General:  Denies chills and sweats. ENT:  Denies hoarseness.  Physical Exam  General:  in no acute distress; alert,appropriate and cooperative throughout examination Ears:  External ear exam shows no significant lesions or deformities.  Otoscopic examination reveals clear canals, tympanic membranes are intact bilaterally without bulging, retraction, inflammation or discharge. Hearing is grossly normal bilaterally. Nose:  External nasal examination shows no deformity or inflammation. Nasal mucosa are pink and moist without lesions or exudates. Septal deviation Mouth:  Oral mucosa and oropharynx without lesions or exudates.  Teeth in good repair. Lungs:  Normal respiratory effort, chest expands symmetrically. Lungs are clear to auscultation, no crackles or wheezes. Heart:  normal rate, no murmur, no gallop, no rub, and irregular rhythm.  Impression & Recommendations:  Problem # 1:  COUGH (ICD-786.2)  ? related to aspiration in context of esophageal stricture & parkinson's  Orders: Gastroenterology Referral (GI) T-2 View CXR (71020TC)  Problem # 2:  ATRIAL FIBRILLATION (ICD-427.31) rate controlled The following medications were removed from the medication list:    Amiodarone Hcl 200 Mg Tabs (Amiodarone hcl) .Marland Kitchen... 1 by mouth once daily His updated medication list for this problem includes:    Warfarin Sodium 5 Mg  Tabs (Warfarin sodium) .Marland Kitchen... 1 tablet tues-thurs-sat, and 1 1/2 tablet on mon-wed-fri-sun  Problem # 3:  PARKINSON'S DISEASE (ICD-332.0)  Complete Medication List: 1)  Requip 3 Mg Tabs (Ropinirole hcl) .... 3 times per day 2)  Carbidopa-levodopa 25-100 Mg Tabs (Carbidopa-levodopa) .Marland Kitchen.. 1 by mouth tid 3)  Seligiline Hcl 5 Mg  .Marland KitchenMarland Kitchen. 1 by mouth qd 4)  Digoxin 250 Mg Tabs Lnt (digoxin)  .... 1/2 tablet per day 5)  Multivitamins Tabs (Multiple vitamin) .... Take 1 tablet by mouth daily 6)  Warfarin Sodium 5 Mg Tabs (Warfarin sodium) .Marland Kitchen.. 1 tablet tues-thurs-sat, and 1 1/2 tablet on mon-wed-fri-sun 7)  Levothyroxine Sodium 25 Mcg Tabs (Levothyroxine sodium) .Marland Kitchen.. 1 by mouth q d 8)  Clonazepam 0.5 Mg Tabs (Clonazepam) .... Take 1/2 tab at bedtime 9)  Mucinex Dm 30-600 Mg Xr12h-tab (Dextromethorphan-guaifenesin) .... As needed 10)  Citalopram Hydrobromide 20 Mg Tabs (Citalopram hydrobromide) .... 1/2 tablet po once daily 11)  Fludrocortisone 0.1mg   .... 1/2 tablet per day in the morning  Patient Instructions: 1)  Continue swallowing maneuvers as per Ms. Hizer until seen by GI

## 2010-03-11 NOTE — Progress Notes (Signed)
Summary: PT/INR Results  Phone Note Outgoing Call Call back at Home Phone [phone]   Call placed by: Shonna Chock,  July 31, 2009 2:06 PM Call placed to: Patient Summary of Call: Left message on VM with instrution below, patient to call if any questions or concerns  no change; recheck 4 weeks Signed by Marga Melnick MD on 07/31/2009 at 1:17 PM  Carlos Gonzalez  July 31, 2009 2:06 PM

## 2010-03-11 NOTE — Procedures (Signed)
Summary: Colonoscopy   Colonoscopy  Procedure date:  12/15/2004  Findings:      Results: Polyp.  Location:  [facility].    Comments:      Repeat colonoscopy in 5 years.  Patient Name: Carlos Gonzalez, Carlos Gonzalez MRN:  Procedure Procedures: Colonoscopy CPT: [phone].    with Hot Biopsy(s)CPT: [id number].  Personnel: Endoscopist: Barbette Hair. Arlyce Dice, MD.  Referred By: Titus Dubin Alwyn Ren, MD.  Patient Consent: Procedure, Alternatives, Risks and Benefits discussed, consent obtained, from patient.  Indications Symptoms: Weight Loss.  History  Current Medications: Patient is not currently taking Coumadin.  Pre-Exam Physical: Performed Dec 15, 2004. Cardio-pulmonary exam, HEENT exam , Abdominal exam, Mental status exam WNL.  Exam Exam: Extent of exam reached: Cecum, extent intended: Cecum.  The cecum was identified by IC valve. Colon retroflexion performed. ASA Classification: II. Tolerance: good.  Monitoring: Pulse and BP monitoring, Oximetry used. Supplemental O2 given. at 2 Liters.  Colon Prep Used Miralax for colon prep. Prep results: good.  Sedation Meds: Patient assessed and found to be appropriate for moderate (conscious) sedation. Sedation was managed by the Endoscopist. Fentanyl 50 mcg. given IV. Versed 6 mg. given IV.  Findings - MUCOSAL ABNORMALITY: Cecum. Comments: Prominent fold.  Bxs taken to r/o adenomatous changes.  NORMAL EXAM: Cecum.  POLYP: Cecum, Maximum size: 3 mm. sessile polyp. Procedure:  hot biopsy, ICD9: Colon Polyps: 211.3.  NORMAL EXAM: Rectum.   Assessment Abnormal examination, see findings above.  Diagnoses: 211.3: Colon Polyps.   Events  Unplanned Interventions: No intervention was required.  Unplanned Events: There were no complications. Plans  Post Exam Instructions: Post sedation instructions given.  Medication Plan: Await pathology. No medications are required.  Disposition: After procedure patient sent to recovery.  After recovery patient sent home.  Scheduling/Referral: Office Visit, to [employer]. Arlyce Dice, MD, around  Colonoscopy, to Barbette Hair. Arlyce Dice, MD, 6 months if fold is adenomatous, around Dec 15, 2009.    This report was created from the original endoscopy report, which was reviewed and signed by the above listed endoscopist.

## 2010-03-11 NOTE — Letter (Signed)
Summary: [facility]   Imported By: Lanelle Bal 08/06/2009 14:38:13  _____________________________________________________________________  External Attachment:    Type:   Image     Comment:   External Document

## 2010-03-11 NOTE — Miscellaneous (Signed)
Summary: ST Orders & Eval/[facility]  ST Orders & Eval/[facility]   Imported By: Lanelle Bal 08/19/2009 11:15:38  _____________________________________________________________________  External Attachment:    Type:   Image     Comment:   External Document

## 2010-03-11 NOTE — Letter (Signed)
Summary: [facility] Instructions/EGD  [facility]  [address] [place], [place] [address]   Phone: [phone]  Fax: [phone]       Carlos Gonzalez    1931/02/06    MRN: [id number]        Procedure Day /DateTHURSDAY 11/12/2009     Arrival Time:1PM     Procedure Time:2PM     Location of Procedure:                    X   [facility] (4th Floor)   PREPARATION FOR COLONOSCOPY WITH MOVIPREP   Starting 5 days prior to your procedure10/02/2009 do not eat nuts, seeds, popcorn, corn, beans, peas,  salads, or any raw vegetables.  Do not take any fiber supplements (e.g. Metamucil, Citrucel, and Benefiber).  THE DAY BEFORE YOUR PROCEDURE         DATE:11/11/2009 DAY: WEDNESDAY  1.  Drink clear liquids the entire day-NO SOLID FOOD  2.  Do not drink anything colored red or purple.  Avoid juices with pulp.  No orange juice.  3.  Drink at least 64 oz. (8 glasses) of fluid/clear liquids during the day to prevent dehydration and help the prep work efficiently.  CLEAR LIQUIDS INCLUDE: Water Jello Ice Popsicles Tea (sugar ok, no milk/cream) Powdered fruit flavored drinks Coffee (sugar ok, no milk/cream) Gatorade Juice: apple, white grape, white cranberry  Lemonade Clear bullion, consomm, broth Carbonated beverages (any kind) Strained chicken noodle soup Hard Candy                             4.  In the morning, mix first dose of MoviPrep solution:    Empty 1 Pouch A and 1 Pouch B into the disposable container    Add lukewarm drinking water to the top line of the container. Mix to dissolve    Refrigerate (mixed solution should be used within 24 hrs)  5.  Begin drinking the prep at 5:00 p.m. The MoviPrep container is divided by 4 marks.   Every 15 minutes drink the solution down to the next mark (approximately 8 oz) until the full liter is complete.   6.  Follow completed prep with 16 oz of clear liquid of your choice (Nothing red or purple).   Continue to drink clear liquids until bedtime.  7.  Before going to bed, mix second dose of MoviPrep solution:    Empty 1 Pouch A and 1 Pouch B into the disposable container    Add lukewarm drinking water to the top line of the container. Mix to dissolve    Refrigerate  THE DAY OF YOUR PROCEDURE      DATE: 11/12/2009 DAY: THURSDAY  Beginning at 9a.m. (5 hours before procedure):         1. Every 15 minutes, drink the solution down to the next mark (approx 8 oz) until the full liter is complete.  2. Follow completed prep with 16 oz. of clear liquid of your choice.    3. You may drink clear liquids until 12PM (2 HOURS BEFORE PROCEDURE).   MEDICATION INSTRUCTIONS  Unless otherwise instructed, you should take regular prescription medications with a small sip of water   as early as possible the morning of your procedure.   Stop taking Coumadin on  11/07/2009  (5 days before procedure).PENDING DR BRACKBILL  Additional medication instructions: You will be contaced by our office prior to your  procedure for directions on holding your Coumadin/Warfarin.  If you do not hear from our office 1 week prior to your scheduled procedure, please call [phone] to discuss.         OTHER INSTRUCTIONS  You will need a responsible adult at least 75 years of age to accompany you and drive you home.   This person must remain in the waiting room during your procedure.  Wear loose fitting clothing that is easily removed.  Leave jewelry and other valuables at home.  However, you may wish to bring a book to read or  an iPod/MP3 player to listen to music as you wait for your procedure to start.  Remove all body piercing jewelry and leave at home.  Total time from sign-in until discharge is approximately 2-3 hours.  You should go home directly after your procedure and rest.  You can resume normal activities the  day after your procedure.  The day of your procedure you should not:   Drive    Make legal decisions   Operate machinery   Drink alcohol   Return to work  You will receive specific instructions about eating, activities and medications before you leave.    The above instructions have been reviewed and explained to me by   _______________________    I fully understand and can verbalize these instructions _____________________________ Date _________

## 2010-03-11 NOTE — Progress Notes (Signed)
Summary: Chest X-ray  Phone Note From Other Clinic   Caller: Asher Muir- Radiology  Summary of Call: Asher Muir from Radiology is faxing over pts chest x-ray, Please make sure Dr.Hopper sees this. Army Fossa CMA  Jun 09, 2009 8:36 AM   Follow-up for Phone Call        Dr.Hopper already addressed Xray, results to be mailed to patient. Follow-up by: Shonna Chock,  Jun 09, 2009 8:45 AM

## 2010-03-11 NOTE — Procedures (Signed)
Summary: Upper Endoscopy  Patient: Carlos Gonzalez Note: All result statuses are Final unless otherwise noted.  Tests: (1) Upper Endoscopy (EGD)   EGD Upper Endoscopy       DONE     [facility]     [address]     [place], [place]  [address]           ENDOSCOPY PROCEDURE REPORT           PATIENT:  Carlos, Gonzalez  MR#:  [id number]     BIRTHDATE:  1930/05/07, 79 yrs. old  GENDER:  male           ENDOSCOPIST:  Barbette Hair. Arlyce Dice, MD     Referred by:           PROCEDURE DATE:  11/12/2009     PROCEDURE:  EGD with biopsy, Maloney Dilation of Esophagus     ASA CLASS:  Class II     INDICATIONS:  dysphagia           MEDICATIONS:   There was residual sedation effect present from     prior procedure., glycopyrrolate (Robinal) 0.2 mg IV, 0.6cc     simethancone 0.6 cc PO     TOPICAL ANESTHETIC:  Exactacain Spray           DESCRIPTION OF PROCEDURE:   After the risks benefits and     alternatives of the procedure were thoroughly explained, informed     consent was obtained.  The LB [id number] endoscope was     introduced through the mouth and advanced to the third portion of     the duodenum, without limitations.  The instrument was slowly     withdrawn as the mucosa was fully examined.     <<PROCEDUREIMAGES>>           Moderate gastritis was found. Enlarged but soft gastric folds in     cardia and fundus with areas of submucosal hemorrhage. Bxs taken     (see image1).  A stricture was found at the gastroesophageal     junction (see image2). Possible early stricture Dilation with     maloney dilator 18mm Mild resistance; no heme  Otherwise the     examination was normal (see image5, image6, and image7).     Retroflexed views revealed no abnormalities.    The scope was then     withdrawn from the patient and the procedure completed.           COMPLICATIONS:  None           ENDOSCOPIC IMPRESSION:     1) Moderate gastritis (abnormal gastric folds)     2) Stricture  at the gastroesophageal junction     3) Otherwise normal examination     RECOMMENDATIONS:     1) Await biopsy results     2) dilatations PRN     3) Call office next 2-3 days to schedule an office appointment     for 3 weeks           REPEAT EXAM:  No           ______________________________     Barbette Hair. Arlyce Dice, MD           CC:  Pecola Lawless, MD           n.     Rosalie Doctor:   Barbette Hair. Quinnton Bury at 11/12/2009 03:21 PM  Pacer, Dorn, [phone]  Note: An exclamation mark (!) indicates a result that was not dispersed into the flowsheet. Document Creation Date: 11/12/2009 3:21 PM _______________________________________________________________________  (1) Order result status: Final Collection or observation date-time: 11/12/2009 15:09 Requested date-time:  Receipt date-time:  Reported date-time:  Referring Physician:   Ordering Physician: Melvia Heaps [phone]) Specimen Source:  Source: Launa Grill Order Number: [id number] Lab site:

## 2010-03-11 NOTE — Progress Notes (Signed)
Summary: [facility]  Phone Note Call from Patient   Caller: [facility] (09/08/2009 - 10/07/2009) Summary of Call: RECVD PAPEWORK FROM [facility]-- (11/2008)  ( 09/08/2009 - 10/07/2009)  WILL TAKE TO CHRAE IN PLASTIC SLEEVE  PLEASE COMPLETE AND RETURN BILLING SHEET AS WELL AS ALL PAPERWORK TO [facility] Initial call taken by: Jerolyn Shin,  September 15, 2009 9:25 AM  Follow-up for Phone Call        Placed on Dr.Hopper's ledge for sig Follow-up by: Shonna Chock CMA,  September 15, 2009 12:13 PM  Additional Follow-up for Phone Call Additional follow up Details #1::        Papers were completed, mailed, & copied (sent to be scanned) Additional Follow-up by: Shonna Chock CMA,  September 17, 2009 9:20 AM

## 2010-03-11 NOTE — Letter (Signed)
Summary: New Patient letter  [facility]  [address] [place], [place] [address]   Phone: [phone]  Fax: [phone]       09/25/2009 MRN: [id number]  [facility] [address] [place], [place]  [address]  Dear Mr. Carlos Gonzalez,  Welcome to the Gastroenterology Division at [facility].    You are scheduled to see Dr.  Arlyce Dice on 10-19-09 at 9:00a.m. on the 3rd floor at [facility], [address]  We ask that you try to arrive at our office 15 minutes prior to your appointment time to allow for check-in.  We would like you to complete the enclosed self-administered evaluation form prior to your visit and bring it with you on the day of your appointment.  We will review it with you.  Also, please bring a complete list of all your medications or, if you prefer, bring the medication bottles and we will list them.  Please bring your insurance card so that we may make a copy of it.  If your insurance requires a referral to see a specialist, please bring your referral form from your primary care physician.  Co-payments are due at the time of your visit and may be paid by cash, check or credit card.     Your office visit will consist of a consult with your physician (includes a physical exam), any laboratory testing he/she may order, scheduling of any necessary diagnostic testing (e.g. x-ray, ultrasound, CT-scan), and scheduling of a procedure (e.g. Endoscopy, Colonoscopy) if required.  Please allow enough time on your schedule to allow for any/all of these possibilities.    If you cannot keep your appointment, please call [phone] to cancel or reschedule prior to your appointment date.  This allows [place] the opportunity to schedule an appointment for another patient in need of care.  If you do not cancel or reschedule by 5 p.m. the business day prior to your appointment date, you will be charged a $50.00 late cancellation/no-show fee.    Thank you for  choosing [facility] for your medical needs.  We appreciate the opportunity to care for you.  Please visit [place] at our website  to learn more about our practice.                     Sincerely,                                                             The Gastroenterology Division

## 2010-03-11 NOTE — Progress Notes (Signed)
Summary: Lab Results  Phone Note Outgoing Call Call back at [facility] Phone [phone]   Call placed by: Shonna Chock,  Jun 11, 2009 10:01 AM Call placed to: Patient Summary of Call: Spoke with patient  Labs are excellent EXCEPT for very low white count with increased Lymphocyte count. This pattern usually suggests a recent viral infection. Repeat chest Xray & CBC & dif in 1 week ( Code:793.1). Call if having progression of symptoms ,shortness of breath or fever.Low albumin reflects decreased nutrition. Freeze the Ensure & eat it like ice cream to see if that makes it more palatable.  Scheduled appointment to go to [facility] Next week for labs, order for recheck on Xray was mailed./Carlos Gonzalez  Jun 11, 2009 10:24 AM   Carlos Gonzalez  Jun 11, 2009 10:02 AM

## 2010-03-11 NOTE — Letter (Signed)
Summary: Letter with Patient Concerns/[facility]  Letter with Patient Concerns/[facility]   Imported By: Lanelle Bal 09/28/2009 10:54:39  _____________________________________________________________________  External Attachment:    Type:   Image     Comment:   External Document

## 2010-03-11 NOTE — Assessment & Plan Note (Signed)
Summary: TO DISCUSS TEST RESULTS FURTHER PER PT'S SPOUSE/RH......Marland Kitchen   Vital Signs:  Patient profile:   77 year old male Weight:      160 pounds Temp:     97.6 degrees F oral Pulse rate:   64 / minute Resp:     17 per minute BP sitting:   104 / 68  (left arm) Cuff size:   regular  Vitals Entered By: Shonna Chock CMA (October 19, 2009 4:31 PM) CC: Follow-up on COPD per Dr.Love, Discuss Citalopram, COPD follow-up   CC:  Follow-up on COPD per Dr.Love, Discuss Citalopram, and COPD follow-up.  History of Present Illness: Dr Sandria Manly questions Citalopram affecting  Parkinson' s drugs. Also he is concerned about "COAD" on Xray. The patient reports  intermittent cough and exercise induced symptoms, but denies shortness of breath @ rest, wheezing, increased sputum, and nocturnal awakening.  The patient reports limitation of strenuous activities, specifically climbing stairs .  No respiratory  limitation to normal walking. Symptoms are worse with exercise as noted, not with ADLs .  No PMH of asthma; never smoked.  Current Medications (verified): 1)  Requip 3 Mg  Tabs (Ropinirole Hcl) .... 3 Times Per Day 2)  Carbidopa-Levodopa 25-100 Mg  Tabs (Carbidopa-Levodopa) .Marland Kitchen.. 1 By Mouth Tid 3)  Seligiline Hcl 5 Mg .Marland Kitchen.. 1 By Mouth Qd 4)  Digoxin 250 Mg Tabs Lnt (Digoxin) .... 1/2 Tablet Per Day 5)  Multivitamins   Tabs (Multiple Vitamin) .... Take 1 Tablet By Mouth Daily 6)  Warfarin Sodium 5 Mg Tabs (Warfarin Sodium) .Marland Kitchen.. 1 Tablet Tues-Thurs-Sat, and 1 1/2 Tablet On Mon-Wed-Fri-Sun 7)  Levothyroxine Sodium 100 Mcg Tabs (Levothyroxine Sodium) .... One Tablet By Mouth Once Daily 8)  Clonazepam 0.5 Mg Tabs (Clonazepam) .... Take 1/2 Tab At Bedtime 9)  Mucinex Dm 30-600 Mg Xr12h-Tab (Dextromethorphan-Guaifenesin) .... As Needed 10)  Citalopram Hydrobromide 20 Mg Tabs (Citalopram Hydrobromide) .... 1/2 Tablet Po Once Daily 11)  Fludrocortisone 0.1mg  .... 1/2 Tablet Per Day in The Morning 12)  Claritin 10 Mg  Tabs (Loratadine) .... One Tablet By Mouth Once Daily 13)  Moviprep 100 Gm  Solr (Peg-Kcl-Nacl-Nasulf-Na Asc-C) .... As Per Prep Instructions.  Allergies: 1)  ! Sulfa  Physical Exam  General:  Thin but in no acute distress; alert,appropriate and cooperative throughout examination Lungs:  Normal respiratory effort, chest expands symmetrically. Lungs are clear to auscultation, no crackles or wheezes. Heart:  normal rate and irregular rhythm.   Extremities:  No clubbing, cyanosis, edema. Cervical Nodes:  No lymphadenopathy noted Axillary Nodes:  No palpable lymphadenopathy Psych:  Oriented X3, memory intact for recent and remote, normally interactive, good eye contact, not anxious appearing, and not depressed appearing.     Impression & Recommendations:  Problem # 1:  UNS ADVRS EFF UNS RX MEDICINAL&BIOLOGICAL SBSTNC (ICD-995.20) ? adverse effect of Citalopram on Parkinson's status  Problem # 2:  CHEST XRAY, ABNORMAL (ICD-793.1) "Pseudo Emphysema"  due to body habitus  Complete Medication List: 1)  Requip 3 Mg Tabs (Ropinirole hcl) .... 3 times per day 2)  Carbidopa-levodopa 25-100 Mg Tabs (Carbidopa-levodopa) .Marland Kitchen.. 1 by mouth tid 3)  Seligiline Hcl 5 Mg  .Marland KitchenMarland Kitchen. 1 by mouth qd 4)  Digoxin 250 Mg Tabs Lnt (digoxin)  .... 1/2 tablet per day 5)  Multivitamins Tabs (Multiple vitamin) .... Take 1 tablet by mouth daily 6)  Warfarin Sodium 5 Mg Tabs (Warfarin sodium) .Marland Kitchen.. 1 tablet tues-thurs-sat, and 1 1/2 tablet on mon-wed-fri-sun 7)  Levothyroxine Sodium 100 Mcg Tabs (  Levothyroxine sodium) .... One tablet by mouth once daily 8)  Clonazepam 0.5 Mg Tabs (Clonazepam) .... Take 1/2 tab at bedtime 9)  Mucinex Dm 30-600 Mg Xr12h-tab (Dextromethorphan-guaifenesin) .... As needed 10)  Citalopram Hydrobromide 20 Mg Tabs (Citalopram hydrobromide) .... 1/2 tablet po once daily 11)  Fludrocortisone 0.1mg   .... 1/2 tablet per day in the morning 12)  Claritin 10 Mg Tabs (Loratadine) .... One tablet by  mouth once daily 13)  Moviprep 100 Gm Solr (Peg-kcl-nacl-nasulf-na asc-c) .... As per prep instructions.  Other Orders: Pneumococcal Vaccine (04540) Admin 1st Vaccine (98119)  Patient Instructions: 1)  Wean & stop Citalopram. Take 1/2 pill X 5 days then stop it.   Immunizations Administered:  Pneumonia Vaccine:    Vaccine Type: Pneumovax (Medicare)    Site: right deltoid    Mfr: Merck    Dose: 0.5 ml    Route: IM    Given by: Shonna Chock CMA    Exp. Date: 04/22/2011    Lot #: [id number]

## 2010-03-11 NOTE — Letter (Signed)
Summary: CT Imaging Options Form/[facility]  CT Imaging Options Form/[facility]   Imported By: Lanelle Bal 06/23/2009 12:17:40  _____________________________________________________________________  External Attachment:    Type:   Image     Comment:   External Document

## 2010-03-11 NOTE — Progress Notes (Signed)
Summary: PT/INR  Phone Note From Other Clinic Call back at [phone]   Caller: Juliette Alcide- Dr.Brackbill Summary of Call: Juliette Alcide from Dr.Brackbills office called and the pt has a follow up visit with Dr.Hopper tomorrow. They would like for Dr.Hopper to check his PT/INR. Please call back if this is okay or not. Army Fossa CMA  Jun 30, 2009 12:46 PM   Follow-up for Phone Call        NOTED Follow-up by: Shonna Chock,  Jun 30, 2009 1:34 PM

## 2010-03-16 ENCOUNTER — Other Ambulatory Visit: Payer: Self-pay

## 2010-03-17 ENCOUNTER — Other Ambulatory Visit (INDEPENDENT_AMBULATORY_CARE_PROVIDER_SITE_OTHER): Payer: Medicare Other

## 2010-03-17 DIAGNOSIS — I4891 Unspecified atrial fibrillation: Secondary | ICD-10-CM

## 2010-03-17 DIAGNOSIS — Z7901 Long term (current) use of anticoagulants: Secondary | ICD-10-CM

## 2010-03-22 ENCOUNTER — Telehealth (INDEPENDENT_AMBULATORY_CARE_PROVIDER_SITE_OTHER): Payer: Self-pay | Admitting: *Deleted

## 2010-03-31 NOTE — Progress Notes (Signed)
Summary: Citalopram refill  Phone Note Refill Request Message from:  Fax from Pharmacy on March 22, 2010 3:12 PM  Refills Requested: Medication #1:  CITALOPRAM HYDROBROMIDE 20 MG TABS 1/2 tablet po once daily   Last Refilled: [facility]/2010 [id number], [address], [place], [place]      [phone],   fax - [phone]    qty = 30  Next Appointment Scheduled: none Initial call taken by: Jerolyn Shin,  March 22, 2010 3:13 PM    Prescriptions: CITALOPRAM HYDROBROMIDE 20 MG TABS (CITALOPRAM HYDROBROMIDE) 1/2 tablet po once daily  #15 x 5   Entered by:   Shonna Chock CMA   Authorized by:   Marga Melnick MD   Signed by:   Shonna Chock CMA on 03/22/2010   Method used:   [facility]        [facility] [phone]* (retail)       [address]       [place], [place]  [address]       Ph: [phone] or [phone]       Fax: [phone]   RxID:   [id number]

## 2010-04-12 ENCOUNTER — Ambulatory Visit (INDEPENDENT_AMBULATORY_CARE_PROVIDER_SITE_OTHER): Payer: Medicare Other | Admitting: Internal Medicine

## 2010-04-12 ENCOUNTER — Telehealth (INDEPENDENT_AMBULATORY_CARE_PROVIDER_SITE_OTHER): Payer: Self-pay | Admitting: *Deleted

## 2010-04-12 ENCOUNTER — Encounter: Payer: Self-pay | Admitting: Internal Medicine

## 2010-04-12 DIAGNOSIS — J209 Acute bronchitis, unspecified: Secondary | ICD-10-CM

## 2010-04-14 ENCOUNTER — Other Ambulatory Visit (INDEPENDENT_AMBULATORY_CARE_PROVIDER_SITE_OTHER): Payer: Medicare Other

## 2010-04-14 DIAGNOSIS — I4891 Unspecified atrial fibrillation: Secondary | ICD-10-CM

## 2010-04-14 DIAGNOSIS — Z7901 Long term (current) use of anticoagulants: Secondary | ICD-10-CM

## 2010-04-20 NOTE — Assessment & Plan Note (Signed)
Summary: cough and chest congestion pneumonia concerns/cdj   Vital Signs:  Patient profile:   77 year old male Weight:      175.2 pounds BMI:     23.20 Temp:     98.1 degrees F oral Pulse rate:   60 / minute Resp:     16 per minute BP sitting:   100 / 76  (left arm) Cuff size:   regular  Vitals Entered By: Shonna Chock CMA (April 12, 2010 11:21 AM) CC: Scratchy throat and chest congestion since Friday, URI symptoms   Primary Care Provider:  Marga Melnick, MD  CC:  Scratchy throat and chest congestion since Friday and URI symptoms.  History of Present Illness:    Onset 04/09/2010 as ST & sniffles ; he now  reports productive cough with green sputum as of 0303, but  he denies nasal congestion, purulent nasal discharge, and earache.  The patient denies fever or increase in baseline dyspnea, and wheezing.  The patient denies frontal headache, bilateral facial pain, tooth pain, and tender adenopathy.  Rx: Mucinex  Current Medications (verified): 1)  Requip 3 Mg  Tabs (Ropinirole Hcl) .... 2mg  Two Times A Day, 3mg  Once Daily 2)  Carbidopa-Levodopa 25-100 Mg  Tabs (Carbidopa-Levodopa) .Marland Kitchen.. 1 By Mouth Tid 3)  Seligiline Hcl 5 Mg .Marland Kitchen.. 1 By Mouth Qd 4)  Digoxin 250 Mg Tabs Lnt (Digoxin) .... 1/2 Tablet Per Day 5)  Multivitamins   Tabs (Multiple Vitamin) .... Take 1 Tablet By Mouth Daily 6)  Warfarin Sodium 5 Mg Tabs (Warfarin Sodium) .Marland Kitchen.. 1 Tablet Tues-Thurs-Sat, and 1 1/2 Tablet On Mon-Wed-Fri-Sun 7)  Levothyroxine Sodium 100 Mcg Tabs (Levothyroxine Sodium) .... One Tablet By Mouth Once Daily 8)  Mucinex Dm 30-600 Mg Xr12h-Tab (Dextromethorphan-Guaifenesin) .... As Needed 9)  Citalopram Hydrobromide 20 Mg Tabs (Citalopram Hydrobromide) .Marland Kitchen.. 1 By Mouth Once Daily 10)  Fludrocortisone 0.1mg  .... 1/2 Tablet Per Day in The Morning 11)  Claritin 10 Mg Tabs (Loratadine) .... One Tablet By Mouth Once Daily  Allergies: 1)  ! Sulfa  Physical Exam  General:  Thin,in no acute distress;  alert,appropriate and cooperative throughout examination Ears:  External ear exam shows no significant lesions or deformities.  Otoscopic examination reveals clear canals, tympanic membranes are intact bilaterally without bulging, retraction, inflammation or discharge. Mid TM scrring. Nose:  External nasal examination shows no deformity or inflammation. Nasal mucosa are dry without lesions or exudates. Septal dislocation Mouth:  Oral mucosa and oropharynx without lesions or exudates.  Teeth in good repair. Lungs:  Normal respiratory effort, chest expands symmetrically. Lungs are clear to auscultation, no crackles or wheezes. Cervical Nodes:  No lymphadenopathy noted Axillary Nodes:  No palpable lymphadenopathy   Impression & Recommendations:  Problem # 1:  BRONCHITIS-ACUTE (ICD-466.0)  His updated medication list for this problem includes:    Mucinex Dm 30-600 Mg Xr12h-tab (Dextromethorphan-guaifenesin) .Marland Kitchen... As needed    Amoxicillin 500 Mg Caps (Amoxicillin) .Marland Kitchen... 1 three times a day  Orders: Prescription Created Electronically [phone])  Complete Medication List: 1)  Requip 3 Mg Tabs (Ropinirole hcl) .... 2mg  two times a day, 3mg  once daily 2)  Carbidopa-levodopa 25-100 Mg Tabs (Carbidopa-levodopa) .Marland Kitchen.. 1 by mouth tid 3)  Seligiline Hcl 5 Mg  .Marland KitchenMarland Kitchen. 1 by mouth qd 4)  Digoxin 250 Mg Tabs Lnt (digoxin)  .... 1/2 tablet per day 5)  Multivitamins Tabs (Multiple vitamin) .... Take 1 tablet by mouth daily 6)  Warfarin Sodium 5 Mg Tabs (Warfarin sodium) .Marland Kitchen.. 1 tablet  tues-thurs-sat, and 1 1/2 tablet on mon-wed-fri-sun 7)  Levothyroxine Sodium 100 Mcg Tabs (Levothyroxine sodium) .... One tablet by mouth once daily 8)  Mucinex Dm 30-600 Mg Xr12h-tab (Dextromethorphan-guaifenesin) .... As needed 9)  Citalopram Hydrobromide 20 Mg Tabs (Citalopram hydrobromide) .Marland Kitchen.. 1 by mouth once daily 10)  Fludrocortisone 0.1mg   .... 1/2 tablet per day in the morning 11)  Claritin 10 Mg Tabs (Loratadine) .... One  tablet by mouth once daily 12)  Amoxicillin 500 Mg Caps (Amoxicillin) .Marland Kitchen.. 1 three times a day  Patient Instructions: 1)  Check PT/INR 03/07 as scheduled. 2)  Drink as much NON dairy  fluid as you can tolerate for the next few days. Prescriptions: AMOXICILLIN 500 MG CAPS (AMOXICILLIN) 1 three times a day  #30 x 0   Entered and Authorized by:   Marga Melnick MD   Signed by:   Marga Melnick MD on 04/12/2010   Method used:   [facility] to        [facility] [phone][facility] (retail)       [address]       [place], [place]  [address]       Ph: [phone] or [phone]       Fax: [phone]   RxID:   [id number]    Orders Added: 1)  Est. Patient Level III [59563] 2)  Prescription Created Electronically [phone]

## 2010-04-20 NOTE — Progress Notes (Signed)
  Phone Note Call from Patient Call back at Home Phone [phone]   Caller: Spouse Summary of Call: Pts wife called and states that the pharmacy needs an updated rx for his Citalopram- [facility]. Army Fossa CMA  April 12, 2010 4:39 PM     Prescriptions: CITALOPRAM HYDROBROMIDE 20 MG TABS (CITALOPRAM HYDROBROMIDE) 1 by mouth once daily  #30 x 4   Entered by:   Army Fossa CMA   Authorized by:   Marga Melnick MD   Signed by:   Army Fossa CMA on 04/12/2010   Method used:   [facility] to        [facility] [phone]* (retail)       [address]       [place], [place]  [address]       Ph: [phone] or [phone]       Fax: [phone]   RxID:   [id number]

## 2010-04-22 ENCOUNTER — Ambulatory Visit (INDEPENDENT_AMBULATORY_CARE_PROVIDER_SITE_OTHER): Payer: Medicare Other | Admitting: Cardiology

## 2010-04-22 DIAGNOSIS — I951 Orthostatic hypotension: Secondary | ICD-10-CM

## 2010-04-22 DIAGNOSIS — I4891 Unspecified atrial fibrillation: Secondary | ICD-10-CM

## 2010-04-22 DIAGNOSIS — Z79899 Other long term (current) drug therapy: Secondary | ICD-10-CM

## 2010-05-21 ENCOUNTER — Other Ambulatory Visit: Payer: Self-pay | Admitting: Dermatology

## 2010-05-25 ENCOUNTER — Encounter: Payer: Medicare Other | Admitting: *Deleted

## 2010-06-22 ENCOUNTER — Ambulatory Visit (INDEPENDENT_AMBULATORY_CARE_PROVIDER_SITE_OTHER): Payer: Medicare Other | Admitting: *Deleted

## 2010-06-22 DIAGNOSIS — I4891 Unspecified atrial fibrillation: Secondary | ICD-10-CM

## 2010-06-23 ENCOUNTER — Telehealth: Payer: Self-pay | Admitting: *Deleted

## 2010-06-23 NOTE — Telephone Encounter (Signed)
Agree with advice given

## 2010-06-23 NOTE — Telephone Encounter (Signed)
Patient in for INR yesterday and was complaining of being lightheaded.  Patient left so I called in back yesterday afternoon.  He was saying that the lightheadedness was worse walking up stairs and when he stands up.  States it did happen sometimes when he would turn his hid.  Has had vertigo in the past, but stated symptoms not like that.  Has history of orthostatic hypotension.  Advised to increase him salt intake a little via diet and gatorade per Dr. Patty Sermons.  Call back if no better or worse.

## 2010-06-25 ENCOUNTER — Telehealth: Payer: Self-pay | Admitting: Cardiology

## 2010-06-25 NOTE — Discharge Summary (Signed)
NAMEALONZA, KNISLEY NO.:  [id number]   MEDICAL RECORD NO.:  [id number]          PATIENT TYPE:  INP   LOCATION:  2113                         FACILITY:  [facility]   PHYSICIAN:  Cornell Barman, P.A. LHCDATE OF BIRTH:  Oct 07, 1930   DATE OF ADMISSION:  04/12/2004  DATE OF DISCHARGE:  04/13/2004                                 DISCHARGE SUMMARY   DISCHARGE DIAGNOSIS:  Paroxysmal atrial fibrillation.   HISTORY OF PRESENT ILLNESS:  Mr. Brotherton is a 77 year old white male with  a history of Parkinson's disease.  He presented to the office with  complaints of near syncope.  He was found to be in atrial fibrillation.  He  had nonspecific ST T changes inferiorly.  The patient was admitted to the  hospital for further evaluation.   PAST MEDICAL HISTORY:  1.  Parkinson's disease, seen at [facility] in February of 2006.  2.  Apnea.  3.  Hernia repair x4.  4.  History of knee surgery.  5.  History of ear surgery.   HOSPITAL COURSE:  Problem 1.  Cardiovascular.  The patient presented with  atrial fibrillation.  The patient was started on a Cardizem drip and  heparin.  He converted to normal sinus rhythm.  The patient's Cardizem was  discontinued.  The patient was seen in consultation by cardiology.  They  noted his serial cardiac enzymes were negative.  A two-dimensional  echocardiogram was performed.  He noted that there was normal left atrial  size and no mitral valve disease.  Dr. Yevonne Pax conclusion was that  likely the atrial fibrillation was precipitated by increased caffeine intake  on the day of admission.  Given the patient's normal left atrial size and  the mitral valve disease, a short duration of his atrial fibrillation before  conversion, he did not feel he needed longterm Coumadin.  He recommended  Ecotrin daily and to continue his Lovenox.  He was felt to be stable for  discharge home.      LC/MEDQ  D:  04/13/2004  T:  04/13/2004  Job:  [id number]   cc:    Titus Dubin. Alwyn Ren, M.D. [facility]   Cassell Clement, M.D.  [address]  [place]  [place] [address]  Fax: [phone]

## 2010-06-25 NOTE — Telephone Encounter (Signed)
Heart rate low 50-65 manually, but thought it was as low as 12 on blood pressure machine. States he is still having some lightheadedness when turns his head. After he started counting manually he realized that the machine was not accurate with the heart rate.  Had shown to Dr. [place] and suggested a monitor.  When called patient back it was late and offered appointment for Monday.  Said he would just wait to see if symptoms went away since he has had this in the past. Did advise to continue to monitor hear rate and if it should get down in the 40's he needs to be evaluated by a physician.  He is to call back Monday 06/28/10 with a report on how he is doing.

## 2010-06-25 NOTE — Consult Note (Signed)
NAMEMIKHAIL, HALLENBECK NO.:  [id number]   MEDICAL RECORD NO.:  [id number]          PATIENT TYPE:  INP   LOCATION:  2113                         FACILITY:  [facility]   PHYSICIAN:  Cassell Clement, M.D. DATE OF BIRTH:  August 04, 1930   DATE OF CONSULTATION:  04/12/2004  DATE OF DISCHARGE:                                   CONSULTATION   HISTORY:  This is a 77 year old married Caucasian gentleman admitted with  new onset of atrial fibrillation associated with dizziness, near syncope,  and mild chest tightness.  The patient has no prior history of any known  coronary artery disease and a normal treadmill Cardiolite stress test September 04, 2003, at which time he walked for 9 minutes 45 seconds on the Bruce  protocol, did not have any chest pain or evidence of ischemia or perfusion  abnormalities and his ejection fraction was normal at 66%.  He underwent  that study as part of a workup for dizzy spells.  He was found to have mild  orthostatic hypotension, possibly exacerbation by his Parkinson's medicines  and he did improve after we instructed him to increase his dietary salt  intake.  He has good exercise tolerance overall and normally plays golf  three times a week.  Today, he was at the church counting the money as he  usually does on Monday mornings.  During that time, he noted that his heart  rate appeared to be fast and irregular and he was slightly dizzy.  He also  noted slight chest tightness.  He had some errands to do and started to  drive to do the errands, go to the post office, etc., but as symptoms  persisted, he drove to Dr. Frederik Pear office where blood pressure was low and  his heart rate was 140 in atrial fibrillation.  The patient gives a history  of having had palpitations of a brief duration going back about 30 years  dating back to when he was a patient of Dr. Carloyn Manner.   FAMILY HISTORY:  His family history reveals that his father and brother have  had  myocardial infarctions.  His father died at 53 and his mother died at 19  of Alzheimer's.  His brother has had coronary artery bypass surgery.   SOCIAL HISTORY:  He is retired.  He does not use tobacco.   REVIEW OF SYMPTOMS:  Essentially  unremarkable except for Parkinsonism and  he is on generic Sinemet and is on Requip.   MEDICATIONS:  Other medications include a coated aspirin daily, ALJ over the  counter three times a day, Requip 3 mg t.i.d., and Carbidopa and Levodopa  25/100 and he generally takes two in the morning and one  at lunch.   PAST SURGICAL HISTORY:  Left ear surgery by Dr. Lyman Bishop.  He has had a  hernia repair at age 49, hernia repair at age 71, appendectomy at age 72, and  knee surgery at age 3.   ALLERGIES:  He is allergic to sulfa and he has had a previous trial of  Lipitor which caused GI  symptoms.   PHYSICAL EXAMINATION:  VITAL SIGNS:  Blood pressure 100/70, pulse 130 in atrial fibrillation,  respirations normal.  GENERAL:  Alert, cooperative gentleman in no distress.  His color is good.  Skin warm and dry.  NECK:  Jugular venous pressure normal, carotids normal.  CHEST:  Clear.  HEART:  No murmurs, gallops, and rubs.  ABDOMEN:  Soft, nontender.  EXTREMITIES:  Good peripheral pulses, no phlebitis or edema.   The patient's electrocardiogram in Dr. Abbe Amsterdam office confirmed atrial  fibrillation with slight ventricular response and a slight ST depression in  the anterior leads, blood workup and chest x-ray are pending.   IMPRESSION:  1.  Paroxysmal sterile fashion.  2.  Chest pain probably secondary to rapid ventricular response, rule out      acute coronary syndrome.  3.  History of chronically low blood pressure improved with increasing salt.  4.  History of dizzy spells in the past felt to be secondary to low blood      pressure and his Parkinson's medications.  5.  Parkinsonism.  6.  Remote history of hypercholesterolemia improved with weight loss and       diet.  7.  History of intolerance to Lipitor.   DISPOSITION:  We are going to treat with IV fluids to help support blood  pressure, IV Cardizem, IV heparin, serial enzymes, will get a 2D echo.  Rather than anticipate chronic beta blocker or Cardizem therapy which could  exacerbate his tendency toward low blood pressure, will use Lanoxin to help  control ventricular response, as well.      TB/MEDQ  D:  04/12/2004  T:  04/12/2004  Job:  [id number]

## 2010-06-25 NOTE — Consult Note (Signed)
Carlos Gonzalez, REWERTS NO.:  [id number]   MEDICAL RECORD NO.:  [id number]          PATIENT TYPE:  INP   LOCATION:  2113                         FACILITY:  [facility]   PHYSICIAN:  Cassell Clement, M.D. DATE OF BIRTH:  11/16/1930   DATE OF CONSULTATION:  04/12/2004  DATE OF DISCHARGE:                                   CONSULTATION   CHIEF COMPLAINT:  Rapid heart action and dizziness.   HISTORY:  This is a 77 year old married Caucasian gentleman admitted with  new onset of atrial fibrillation associated with dizziness, near syncope and  mild chest tightness.  The patient has no prior history of known coronary  artery disease and did have a normal treadmill Cardiolite stress test September 04, 2003 at which time   DICTATION ENDED HERE      TB/MEDQ  D:  04/12/2004  T:  04/12/2004  Job:  [id number]   cc:   Titus Dubin. Alwyn Ren, M.D. [facility]

## 2010-06-25 NOTE — Telephone Encounter (Signed)
PT WANTS TO REPORT SOME BP READINGS TO MELINDA. CHART IN BOX.

## 2010-06-25 NOTE — Assessment & Plan Note (Signed)
[facility]                        [place] OFFICE NOTE   NAME:Foskey, LABARON DIGIROLAMO                   MRN:          [id number]  DATE:04/17/2006                            DOB:          06/30/1930    Mr. Carton presents with profound balance dysfunction which is an  exacerbation of a chronic problem  presnt intermittently for almost 20  years.   For over the last 24 hours he has had increasing balance issues with the  sensation of head fullness.  This was described as a pressure.  He  denies fever, chills, sweats.  He denies nasal congestion, nasal  obstruction, facial pain, or purulent secretions.  He has chronic  anosmia.  He also denies dental pain or earache.  He has some associated  nausea.   Significantly in 1985, he had a wire prosthesis placed in  the left  middle ear which did not address the similar symptoms.  He subsequently  saw Dr. Garrison Columbus and had reconstruction of the middle ear bones  and tympanic membrane with response.  Unfortunately shortly after  the  reconstruction had to be repeated  & cartilage was used to rebuild the  tympanic membrane   He has had some symptoms of extrinsic rhinoconjunctivitis with itchy and  watery eyes.   PAST MEDICAL HISTORY:  1. Paroxysmal atrial fibrillation.  2. He has been bothered by anorexia and weight loss.  He has been seen      by nutrition, and they had recommended Megace.  I deferred      implementing this because it would be an off label use in the      absence of  HIV orcancer-associated  weight loss.  In addition, I      was concerned about the risk of clotting.  He has a history of      paroxysmal atrial fibrillation.  3. Hernia repair.  4. Appendectomy  5. Colonoscopy.  A tubular adenoma was found at colonoscopy in 2006.  6. He does have Parkinson's.   [facility] is [facility] his Parkinson's. Those meds were reviewed.  Baby aspirin.  Multivitamins   PHYSICAL EXAMINATION:  VITAL SIGNS: His weight is actually up almost 3-  1/2 pounds to 171.  Temperature was 97, pulse 72, respiratory rate 17,  blood pressure 100/60.  This is normal blood pressure.  HEAD AND NECK:  He has minimal unsustained nystagmus with lateral gaze.  There is marked distortion of both tympanic membranes but with absence  of erythema or purulence.  He has no lymphadenopathy of head, neck,  axillae.  The nares are patent.  Oropharynx is clear.  No carotid bruits  were noted.  HEART:  Rhythm is regular.   The Benadryl he has been taking for his symptoms will be discontinued.  He will be placed on meclizine 25 mg every 6-8 hours as needed.  He will  also receive fexofenadine 180 mg daily.  Should he have thick secretions, then Mucinex will be encouraged.  He  was told to alert [place] should he have facial pain, fever, or purulent  secretions indicating a superimposed  rhinosinusitis.ENT will be  consulted if symptoms persist or progress.     Titus Dubin. Alwyn Ren, MD,FACP,FCCP  Electronically Signed    [facility]/MedQ  DD: 04/17/2006  DT: 04/17/2006  Job #: [id number]

## 2010-06-25 NOTE — Discharge Summary (Signed)
[name], [name] NO.:  [id number]   MEDICAL RECORD NO.:  [id number]          PATIENT TYPE:  INP   LOCATION:  2113                         FACILITY:  [facility]   PHYSICIAN:  Thomos Lemons, D.O. [facility]   DATE OF BIRTH:  1930-04-12   DATE OF ADMISSION:  04/12/2004  DATE OF DISCHARGE:  04/13/2004                                 DISCHARGE SUMMARY   ADDENDUM:   DISCHARGE MEDICATIONS:  1.  Ecotrin 325 mg daily.  2.  Lanoxin 0.25 mg daily.  3.  Requip 3 mg t.i.d.  4.  Levodopa 25/100 mg t.i.d.  5.  _________ 5 mg daily.   FOLLOWUP:  1.  Follow up with Dr. Cassell Clement in about 7 to 10 days for an office      visit, electrocardiogram and a digoxin level.  2.  He should also see Dr. Titus Dubin. Hopper in the next one to two weeks.      LC/MEDQ  D:  04/13/2004  T:  04/13/2004  Job:  [id number]

## 2010-06-25 NOTE — H&P (Signed)
NAMEMIKKEL, CHARRETTE NO.:  [id number]   MEDICAL RECORD NO.:  [id number]          PATIENT TYPE:  INP   LOCATION:  2113                         FACILITY:  [facility]   PHYSICIAN:  Titus Dubin. Alwyn Ren, M.D. [facility] OF BIRTH:  01/04/1931   DATE OF ADMISSION:  04/12/2004  DATE OF DISCHARGE:                                HISTORY & PHYSICAL   Carlos Gonzalez is a 77 year old white male admitted with near-syncope with  new-onset atrial fibrillation with nonspecific ST-T wave changes inferiorly.   This morning he had been to the church, involved with activities, and had  gone to the post office.  He felt a surge or flow from the epigastrium  into his head associated with sensation of near-syncope.  It was all over  within seconds.  He denied chest pain but a tightness substernally.  He  had no nausea, diaphoresis.  The tightness did improve with belching.   He subsequently drove himself to my office, where he was found to be in  atrial fibrillation with rapid ventricular response with nonspecific ST-T  wave changes and hypotension.   He was given three aspirin and EMS contacted for emergent transport to the  hospital to an intensive care unit.  Nitroglycerin and calcium channel  blockers were not administered in the office because of the relative  hypotension.   PAST MEDICAL HISTORY:  Appendectomy at age 42.  Hernia at age 89, hernia  repeated at age 32, and double hernia in 37 as an outpatient.  He has had  knee surgery and otic surgery.  Colonoscopy was negative in 1995.   He has Parkinson's and is followed by Dr. Oneita Kras in the Department  of Neurology at [facility].  He also has prostatic hypertrophy and Gilbert's  syndrome.   FAMILY HISTORY:  Positive for myocardial infarction in paternal grandfather.  Brother had bypass, and father had congestive heart failure.   He has never smoked.   He is intolerant to Sulfa.   He is on:  1.  Requip 3 mg three times a  day.  2.  Carbidopa/levodopa CR 25/100 t.i.d.  3.  Selegiline HCl 5 mg daily.  4.  Baby aspirin daily.  5.  Perrigo allergy caps OTC.  6.  Garlitrin 4000 mg daily.  7.  Multivitamins and minerals two daily.  8.  ALJ three times a day.  9.  Calcium/magnesium twice a day.  10. ProstaPro twice a day.   REVIEW OF SYSTEMS:  He has had weight loss over the last six to 12 months,  which he relates to decreased portions.  He did have dizziness in mid-2005.  The stress Cardiolite was negative at Dr. Yevonne Pax.  The dizziness was  attributed to a low-sodium diet and possibly with a component of Parkinson's  medicines.  His parkinsonian tremor had been increasing, and he is seen  every six months by Dr. Lorin Picket.  Requip has suppressed the tremor.  He has  been playing golf three times a week and on the treadmill probably three  times a month.  The remainder of the review  of systems is negative.   PHYSICAL EXAMINATION:  GENERAL:  He appears younger than his stated age of  35.  VITAL SIGNS:  Blood pressure was 88/56 and the blood pressure was difficult  to ascertain.  EKG revealed a rate as high as 147 with ST-T wave depression  in II, III, and aVF.  HEENT:  Arteriolar narrowing was present.  The remainder of the exam was  unremarkable except for a minor dislocation of the nasal septum.  NECK:  Thyroid was small and difficult to palpate.  CHEST:  Clear to auscultation with no rales.  CARDIAC:  Irregular rhythm was noted with a flow murmur.  ABDOMEN:  He had no organomegaly or masses, and there was no abdominal  tenderness.  A right lower quadrant operative scar was noted.  NEUROLOGIC:  Marked tremor of the left upper extremity and left lower  extremity were noted.  There were no other neuropsychiatric deficits, as he  was oriented x3.  EXTREMITIES:  He had no peripheral edema.  Crepitus was present in the  knees, greater on the left than the right.  GENITOURINARY:  Exam was not repeated.  He  did have prostatic hypertrophy  when seen November 10, 2003, with some atrophy of the testicles, particularly  on the left.   He will be admitted to the ICU with near-syncope with atypical chest pain  and new-onset atrial fibrillation with ST-T wave depression inferiorly,  associated with hypotension.   Clotting studies will be collected to verify that these are not abnormal, as  he has been taking Garlitin and multiple other supplements which might  affect anticoagulation.  His son made a comment that he does have some  clotting issues.   A consultation with Cassell Clement, M.D., will be pursued, and this was  personally completed by me by telephone.      [facility]/MEDQ  D:  04/13/2004  T:  04/13/2004  Job:  [id number]   cc:   Cassell Clement, M.D.  [address], [address]  [place]  [place] [address]  Fax: [phone]

## 2010-06-27 NOTE — Telephone Encounter (Signed)
Agree with plan 

## 2010-06-28 NOTE — Telephone Encounter (Signed)
Left message

## 2010-06-28 NOTE — Telephone Encounter (Signed)
Patient phoned stating heart rate has been in the 60's, but still having a little lightheadedness.  Doesn't want an appointment at this time.  Will call back iin a few days if continues to schedule something.

## 2010-07-20 ENCOUNTER — Ambulatory Visit (INDEPENDENT_AMBULATORY_CARE_PROVIDER_SITE_OTHER): Payer: Medicare Other | Admitting: *Deleted

## 2010-07-20 DIAGNOSIS — I4891 Unspecified atrial fibrillation: Secondary | ICD-10-CM

## 2010-07-20 LAB — POCT INR: INR: 2.4

## 2010-07-22 ENCOUNTER — Telehealth: Payer: Self-pay | Admitting: *Deleted

## 2010-07-22 NOTE — Telephone Encounter (Signed)
Dr. Sandria Manly phoned and spoke with Dr. Patty Sermons regarding patients low blood pressure and being dizzy.  Patient will increase Florinef to full tablet daily and Dr. Sandria Manly will notify patient per Dr.Brackbill.

## 2010-08-08 HISTORY — PX: US ECHOCARDIOGRAPHY: HXRAD669

## 2010-08-14 ENCOUNTER — Emergency Department (HOSPITAL_COMMUNITY): Payer: Medicare Other

## 2010-08-14 ENCOUNTER — Telehealth: Payer: Self-pay | Admitting: Physician Assistant

## 2010-08-14 ENCOUNTER — Encounter (HOSPITAL_COMMUNITY): Payer: Self-pay

## 2010-08-14 ENCOUNTER — Inpatient Hospital Stay (HOSPITAL_COMMUNITY)
Admission: EM | Admit: 2010-08-14 | Discharge: 2010-08-16 | DRG: 312 | Disposition: A | Discharge status: Home / Self Care | Payer: Medicare Other | Attending: Internal Medicine | Admitting: Internal Medicine

## 2010-08-14 DIAGNOSIS — Z79899 Other long term (current) drug therapy: Secondary | ICD-10-CM

## 2010-08-14 DIAGNOSIS — I4891 Unspecified atrial fibrillation: Secondary | ICD-10-CM | POA: Diagnosis present

## 2010-08-14 DIAGNOSIS — G20A1 Parkinson's disease without dyskinesia, without mention of fluctuations: Secondary | ICD-10-CM | POA: Diagnosis present

## 2010-08-14 DIAGNOSIS — I509 Heart failure, unspecified: Secondary | ICD-10-CM

## 2010-08-14 DIAGNOSIS — I951 Orthostatic hypotension: Principal | ICD-10-CM | POA: Diagnosis present

## 2010-08-14 DIAGNOSIS — G20 Parkinson's disease: Secondary | ICD-10-CM | POA: Diagnosis present

## 2010-08-14 DIAGNOSIS — N390 Urinary tract infection, site not specified: Secondary | ICD-10-CM | POA: Diagnosis present

## 2010-08-14 DIAGNOSIS — Z7901 Long term (current) use of anticoagulants: Secondary | ICD-10-CM

## 2010-08-14 DIAGNOSIS — E860 Dehydration: Secondary | ICD-10-CM | POA: Diagnosis present

## 2010-08-14 DIAGNOSIS — Z8249 Family history of ischemic heart disease and other diseases of the circulatory system: Secondary | ICD-10-CM

## 2010-08-14 DIAGNOSIS — Z882 Allergy status to sulfonamides status: Secondary | ICD-10-CM

## 2010-08-14 LAB — CBC
HCT: 42.1 % (ref 39.0–52.0)
Hemoglobin: 14.7 g/dL (ref 13.0–17.0)
MCH: 32.5 pg (ref 26.0–34.0)
MCHC: 34.9 g/dL (ref 30.0–36.0)
Platelets: 129 10*3/uL — ABNORMAL LOW (ref 150–400)
RDW: 13.2 % (ref 11.5–15.5)

## 2010-08-14 LAB — BASIC METABOLIC PANEL
BUN: 17 mg/dL (ref 6–23)
CO2: 25 mEq/L (ref 19–32)
Chloride: 100 mEq/L (ref 96–112)
Creatinine, Ser: 1.06 mg/dL (ref 0.50–1.35)
GFR calc Af Amer: >60 mL/min (ref >60)
Glucose, Bld: 93 mg/dL (ref 70–99)
Potassium: 3.7 mEq/L (ref 3.5–5.1)

## 2010-08-14 LAB — DIFFERENTIAL
Basophils Relative: 0 % (ref 0–1)
Lymphocytes Relative: 15 % (ref 12–46)
Lymphs Abs: 1.2 10*3/uL (ref 0.7–4.0)
Monocytes Absolute: 0.9 10*3/uL (ref 0.1–1.0)
Monocytes Relative: 12 % (ref 3–12)

## 2010-08-14 LAB — URINE MICROSCOPIC-ADD ON

## 2010-08-14 LAB — PRO B NATRIURETIC PEPTIDE: Pro B Natriuretic peptide (BNP): 2172 pg/mL — ABNORMAL HIGH (ref 0–450)

## 2010-08-14 LAB — PROTIME-INR
INR: 2.75 — ABNORMAL HIGH (ref 0.00–1.49)
Prothrombin Time: 29.5 seconds — ABNORMAL HIGH (ref 11.6–15.2)

## 2010-08-14 LAB — URINALYSIS, ROUTINE W REFLEX MICROSCOPIC
Bilirubin Urine: NEGATIVE
Ketones, ur: NEGATIVE mg/dL
Nitrite: NEGATIVE
Urobilinogen, UA: 1 mg/dL (ref 0.0–1.0)
pH: 6 (ref 5.0–8.0)

## 2010-08-14 LAB — CK TOTAL AND CKMB (NOT AT ARMC): Relative Index: 1.1 (ref 0.0–2.5)

## 2010-08-14 MED ORDER — IOHEXOL 350 MG/ML SOLN
50.0000 mL | Freq: Once | INTRAVENOUS | Status: AC | PRN
  Administered 2010-08-14: 50 mL via INTRAVENOUS

## 2010-08-14 NOTE — Telephone Encounter (Signed)
Patient's wife called.  Patient at [facility], [place] with son.   Has a h/o AFib and Parkinson's. Today, more unsteady and lightheaded.  Also, more disoriented than normal.  Talked to a PA at same hotel who listened to him.  Noted rhythm was irreg irreg.  Rate noted to be in 80s.  Told his BP was higher than normal.   Reportedly has "blue lips."  He was told to come home and see his cardiologist for an ECG. I spoke with wife first.  Then, called son Nadine Counts) at [phone] who is driving home now.  They left 3 hours ago to come home.  Currently in [place]. And think they have an hour drive left to get to [place]. Patient reports dyspnea.  No chest pain.  Has a cough.  No fevers.  Notes some LE edema.  Tough situation.  My recommendation would have been that he be evaluated in [place] with what sounds like some type of respiratory distress in the setting of Afib and noted cough.  Now, he is in a car traveling home.  Patient's son not aware of closest medical facility.  I now recommend that if he notes a medical facility close by, to stop and go to the ED.  Otherwise, he should come straight to the ED at [facility] for further evaluation.  Whatever his options are, he should not delay having him evaluated.

## 2010-08-15 LAB — CARDIAC PANEL(CRET KIN+CKTOT+MB+TROPI)
CK, MB: 2.1 ng/mL (ref 0.3–4.0)
Total CK: 181 U/L (ref 7–232)
Troponin I: <0.3 ng/mL (ref <0.30)

## 2010-08-15 LAB — PROTIME-INR
INR: 2.89 — ABNORMAL HIGH (ref 0.00–1.49)
Prothrombin Time: 30.7 seconds — ABNORMAL HIGH (ref 11.6–15.2)

## 2010-08-16 DIAGNOSIS — R42 Dizziness and giddiness: Secondary | ICD-10-CM

## 2010-08-16 LAB — URINE CULTURE
Culture  Setup Time: 201207081126
Culture: NO GROWTH

## 2010-08-16 LAB — RENAL FUNCTION PANEL
BUN: 17 mg/dL (ref 6–23)
CO2: 29 mEq/L (ref 19–32)
Calcium: 8.4 mg/dL (ref 8.4–10.5)
Chloride: 104 mEq/L (ref 96–112)
Creatinine, Ser: 1.07 mg/dL (ref 0.50–1.35)

## 2010-08-16 LAB — CBC
Hemoglobin: 13.2 g/dL (ref 13.0–17.0)
MCH: 31.4 pg (ref 26.0–34.0)
MCHC: 33.8 g/dL (ref 30.0–36.0)
RDW: 13.2 % (ref 11.5–15.5)

## 2010-08-16 LAB — PROTIME-INR
INR: 3.31 — ABNORMAL HIGH (ref 0.00–1.49)
Prothrombin Time: 34.1 seconds — ABNORMAL HIGH (ref 11.6–15.2)

## 2010-08-18 NOTE — Consult Note (Signed)
Carlos Gonzalez, Carlos Gonzalez NO.:  [id number]  MEDICAL RECORD NO.:  [id number]  LOCATION:  4736                         FACILITY:  [facility]  PHYSICIAN:  Verne Carrow, MDDATE OF BIRTH:  [birth year]  DATE OF CONSULTATION:  08/16/2010 DATE OF DISCHARGE:  08/16/2010                                CONSULTATION   PRIMARY CARDIOLOGIST:  Cassell Clement, MD  PRIMARY CARE PHYSICIAN:  Titus Dubin. Alwyn Ren, MD, FACP, FCCP  REASON FOR CONSULTATION:  Dizziness.  HISTORY OF PRESENT ILLNESS:  Carlos Gonzalez is a pleasant 77 year old Caucasian male with a history of Parkinson disease and chronic atrial fibrillation who was admitted on August 14, 2010, to the Hospitalist Service with complaints of dizziness.  He was found to be orthostatic. He has been hydrated here in the hospital and has had complete resolution of his symptoms.  Orthostatic vital signs this morning show continued hypotension with standing.  The patient walked down the hallways and has been ambulating in the room and has had no complaints of dizziness or lightheadedness today.  He has no other complaints and specifically denies any chest pain or shortness of breath.  He tells me that he feels back to his baseline state of health.  PAST MEDICAL HISTORY: 1. Parkinson disease. 2. Chronic atrial fibrillation, on Coumadin therapy. 3. Benign prostatic hypertrophy.  PAST SURGICAL HISTORY: 1. Appendectomy. 2. Hernia repair x4. 3. Ear surgery.  ALLERGIES:  SULFA.  SOCIAL HISTORY:  The patient is married and consumes 2 glasses of wine per week.  He denies use of tobacco or illicit drugs.  FAMILY HISTORY:  The patient has a history of myocardial infarction in his paternal grandfather.  His brother had coronary artery disease and bypass surgery.  His father had congestive heart failure.  MEDICATIONS: 1. Aspirin 325 mg p.o. once daily. 2. Sinemet 1 tablet p.o. t.i.d. 3. Rocephin 1 g IV once daily. 4. Celexa 20  mg p.o. once daily. 5. Digoxin 0.125 mg p.o. once daily. 6. Florinef 0.1 mg p.o. once daily. 7. ReQuip 2 mg at lunchtime, 3 mg in the evening, 2 mg in the a.m. 8. Selegiline 5 mg by mouth once daily. 9. Coumadin 5 mg on Wednesdays alternating with 7.5 mg every other     day.  REVIEW OF SYSTEMS:  As stated in the history of present illness, is otherwise negative.  PHYSICAL EXAMINATION:  VITAL SIGNS:  Temperature 97.5, pulse 81 and irregular, respirations 18 and unlabored, and blood pressure 142/84.  He is saturating 97% on room air. GENERAL:  He is a pleasant elderly Caucasian male in no acute distress. He is alert and oriented x3. SKIN:  Warm and dry. NECK:  No JVD.  No carotid bruits.  No thyromegaly.  No lymphadenopathy. HEENT:  Normal. NEUROLOGICAL:  Nonfocal. PSYCHIATRIC:  Mood and affect are appropriate. MUSCULOSKELETAL:  Moves all extremities equally. LUNGS:  Clear to auscultation bilaterally without wheezes, rhonchi, or crackles noted. CARDIOVASCULAR:  Irregular rhythm with no loud murmurs noted. ABDOMEN:  Soft and nontender.  Bowel sounds are present. EXTREMITIES:  No evidence of edema.  Pulses are 2+ in all extremities.  DIAGNOSTIC STUDIES: 1. A 12-lead EKG shows atrial fibrillation with a rate of 84  beats per     minute.  There are no ischemic changes noted. 2. Laboratory values show negative cardiac enzymes x2.  INR is 3.3.     Creatinine 1.1.  Potassium 4.2.  Hemoglobin 13.  ASSESSMENT/PLAN: 1. Dizziness which is most likely multifactorial with ongoing urinary     tract infection with orthostasis as well as Parkinson disease.  The     patient tells me that he has been dealing with this for many years.     He has been seen by his cardiologist, Dr. Cassell Clement multiple     times for complaints of lightheadedness.  This could also be     related some to his chronic atrial fibrillation.  I agree with     current management which includes treatment of his urinary  tract     infection as well as hydration and continuation of his medications     as written. 2. Chronic atrial fibrillation.  It is currently rate control.  He is     on chronic anticoagulation with Coumadin therapy.  His INR is     therapeutic today. 3. Disposition.  I think that it is okay for the patient to be     discharged to home today.  I have discussed this with     the patient, his son, and also the treating physician.  We would     plan to follow up with Dr. Cassell Clement in the office in 2-3     weeks for reassessment.  As stated above, I would not recommend any     changes in his medical therapy.     Verne Carrow, MD     CM/MEDQ  D:  08/16/2010  T:  08/17/2010  Job:  [id number]  cc:   Cassell Clement, M.D. Titus Dubin. Alwyn Ren, MD,FACP,FCCP  Electronically Signed by Verne Carrow MD on 08/18/2010 08:24:10 AM

## 2010-08-18 NOTE — Discharge Summary (Signed)
Carlos Gonzalez, Carlos Gonzalez            ACCOUNT NO.:  [id number]  MEDICAL RECORD NO.:  [id number]  LOCATION:  4736                         FACILITY:  [facility]  PHYSICIAN:  Hartley Barefoot, MD    DATE OF BIRTH:  [birth year]  DATE OF ADMISSION:  08/14/2010 DATE OF DISCHARGE:  08/16/2010                              DISCHARGE SUMMARY   CONSULTANT CARDIOLOGY:  Verne Carrow, MD  DISCHARGE DIAGNOSES: 1. Dizziness secondary to worsening orthostatic hypotension. 2. Acute worsening of orthostatic hypotension secondary to     dehydration. 3. Orthostatic hypotension secondary to autonomic dysfunction     secondary to Parkinson disease. 4. Atrial fibrillation, rate controlled, continue with Coumadin. 5. Urinary tract infection. 6. Parkinson disease.  PAST SURGICAL HISTORY: 1. Appendectomy. 2. Hernia repair x4. 3. Ear surgery.  DISCHARGE MEDICATIONS.: 1. Ciprofloxacin 250 mg p.o. b.i.d. 2. Citalopram 20 mg p.o. daily. 3. Digoxin 0.25 mg half a tablet by mouth daily. 4. Fludrocortisone 0.1 mg 1 tablet by mouth daily. 5. Loratadine 1 tablet by mouth daily. 6. Ropinirole 1 mg up to 3 tablets by mouth, 2 tablets in the morning     and 2 tablets at noon, and 3 tablets at bedtime. 7. Selegilin 5 mg 1 tablet by mouth daily. 8. Sinemet 25/100 mg 3 times a day. 9. Warfarin 5 mg one to and a half tablet by mouth as instructed.  DISPOSITION AND FOLLOWUP:  [name] will need to follow with her Cardiology, Dr. Patty Sermons, also he will need to follow with Dr. Avie Echevaria, his neurologist.  BRIEF HISTORY OF PRESENT ILLNESS:  This is a 77 year old with past medical history of orthostatic hypotension secondary to Parkinson disease, who presents complaining of dizziness and gait disorders.  The patient reports that he has had intermittent bouts of worsening lightheadedness for a while now, but he has noticed that his lightheadedness has worsened over the last couple of weeks.  He  noticed these symptoms more when he suddenly stands up or walk up the stairs too fast, also when he changed his position.  He denies any chest pain, palpitations, shortness of breath.  Denies cough, congestion, fever or chills.  He was at the beach today and his son and there was a PA close to where they were staying who noticed that his lips and gums and teeth were bluish and also recommend evaluation in the ER.  However, in the ER, the patient's CBC and BMET was unremarkable.  He was noted to have elevated BNP at 2100 and cloudy urine with possible UTI, [facility] was asked for management.  ASSESSMENT/PLAN: 1. Dizziness.  Dizziness was thought to be multifactorial secondary to     worsening chronic orthostatic hypotension in the setting of     dehydration and urinary tract infection.  Also his Parkinson's     medications might play a role.  The patient with IV fluids.  His     orthostatic hypotension improved.  He was able to walk with the     physical therapist without any lightheadedness.  He feels that he     is now back to baseline.  He will need to follow with his     neurologist for further reduce  of his Parkinson disease     medications. 2. Elevated BNP.  His son reports that the patient was having some     shortness of breath on exertion.  Cardiology was consulted per son      request and Cardiology agreed that the orthostatic hypotension was     multifactorial, that the patient is euvolemic and that he can     follow up with Dr. Patty Sermons in 2 weeks.  There is a 2-D echo that     is pending at this time.  No Lasix at this moment. 3. Paroxysmal atrial fibrillation.  Continue with Coumadin and rate     controlled at this time. 4. Urinary tract infection.  The patient was started on ceftriaxone     and received 2 days of IV ceftriaxone.  He will be discharged on     one more days of Ciprofloxacin.  RADIOGRAPHIC STUDIES:  CT head, normal appearance of the vertebral artery  bilateral, minimal calcification along the sternum carotid artery bilateral without significant stenosis.  Normal appearance of the common carotid arteries and internal carotid arteries.  Mild to moderate spondylosis and mild cervical spine.  CTA normal variants of circle of Willis without significant proximal stenosis, aneurysmal.  Normal CT brain.  Chest x-ray no acute cardiopulmonary process, hyperinflated lungs.  On the day of discharge, the patient was in a stable condition.  Blood pressure 142/84, sat 97% on room air, respirations 19, pulse 81, temperature 97.5.  Urine no growth.  Renal function, sodium 139, potassium 4.2, chloride 104, bicarb 29, glucose 99, BUN 17, creatinine 1.07, albumin 3.0, INR 3.1, white blood cell 5.7, hemoglobin 13, platelet 31.  On the day of discharge, the patient was in stable condition.     Hartley Barefoot, MD     BR/MEDQ  D:  08/16/2010  T:  08/17/2010  Job:  [id number]  cc:   Genene Churn. Love, M.D. Cassell Clement, M.D. Titus Dubin. Alwyn Ren, MD,FACP,FCCP  Electronically Signed by Hartley Barefoot MD on 08/18/2010 01:39:09 PM

## 2010-08-19 ENCOUNTER — Ambulatory Visit (INDEPENDENT_AMBULATORY_CARE_PROVIDER_SITE_OTHER): Payer: Medicare Other | Admitting: *Deleted

## 2010-08-19 DIAGNOSIS — I4891 Unspecified atrial fibrillation: Secondary | ICD-10-CM

## 2010-08-19 LAB — POCT INR: INR: 2.7

## 2010-09-02 ENCOUNTER — Encounter: Payer: Self-pay | Admitting: Nurse Practitioner

## 2010-09-03 ENCOUNTER — Encounter: Payer: Self-pay | Admitting: Nurse Practitioner

## 2010-09-03 ENCOUNTER — Ambulatory Visit (INDEPENDENT_AMBULATORY_CARE_PROVIDER_SITE_OTHER): Payer: Medicare Other | Admitting: Nurse Practitioner

## 2010-09-03 VITALS — BP 100/70 | HR 72 | Ht 73.0 in | Wt 179.6 lb

## 2010-09-03 DIAGNOSIS — I4891 Unspecified atrial fibrillation: Secondary | ICD-10-CM

## 2010-09-03 DIAGNOSIS — I951 Orthostatic hypotension: Secondary | ICD-10-CM

## 2010-09-03 NOTE — Assessment & Plan Note (Signed)
This is chronic. His rate is controlled. He remains on his coumadin.

## 2010-09-03 NOTE — Progress Notes (Signed)
    Carlos Gonzalez Date of Birth: 09/13/1930   History of Present Illness: Rosalia Hammers is seen back today for a post hospital visit. He is seen for Dr. Patty Sermons. He was hospitalized earlier this month with orthostasis and a UTI. He has chronic atrial fib and is on coumadin. He has Parkinson's and is followed by Dr. Sandria Manly. He is here by himself today. He says he feels fine and is back to his baseline. No chest pain or shortness of breath. He is not dizzy. He remains on low dose Florinef. He does not know any of his medicines. His wife fixes his pills for him. Our list does not match with the discharge summary.   Current Outpatient Prescriptions on File Prior to Visit  Medication Sig Dispense Refill  . CARBIDOPA-LEVODOPA PO Take by mouth.       . citalopram (CELEXA) 20 MG tablet Take 20 mg by mouth daily.        . digoxin (LANOXIN) 0.125 MG tablet Take 125 mcg by mouth daily.        . fludrocortisone (FLORINEF) 0.1 MG tablet Take 0.1 mg by mouth daily.        . Multiple Vitamin (MULTIVITAMIN PO) Take by mouth.        Marland Kitchen rOPINIRole (REQUIP) 3 MG tablet Take 3 mg by mouth at bedtime.        . selegiline (ELDEPRYL) 5 MG tablet Take 5 mg by mouth daily.        Marland Kitchen warfarin (COUMADIN) 5 MG tablet Take 5 mg by mouth. As directed per coumadin clinic.         Allergies  Allergen Reactions  . Sulfonamide Derivatives     Past Medical History  Diagnosis Date  . Parkinson's disease   . Hypertension   . Parkinson's disease   . Hypothyroidism     Caused by Amiodarone, resolved  . Orthostatic hypotension     Chronic  . Atrial fibrillation   . Chronic anticoagulation   . BPH (benign prostatic hyperplasia)     Past Surgical History  Procedure Date  . Hernia repair 77yrs old, Age 53  . Appendectomy Age 60  . Knee surgery Age 60  . US echocardiography July 2012    EF 55-60%  . Cardiovascular stress test 09-04-2003    EF 66%    History  Smoking status  . Never Smoker   Smokeless tobacco  . Not  on file    History  Alcohol Use  . Yes    Social    History reviewed. No pertinent family history.  Review of Systems: The review of systems is positive for recent UTI and orthostatic hypotension. He was felt to be dehydrated as well. This has now resolved. No problems with his coumadin.  All other systems were reviewed and are negative.  Physical Exam: BP 100/70  Pulse 72  Ht 6\' 1"  (1.854 m)  Wt 179 lb 9.6 oz (81.466 kg)  BMI 23.70 kg/m2 Patient is very pleasant and in no acute distress. Skin is warm and dry. Color is normal.  HEENT is unremarkable. Normocephalic/atraumatic. PERRL. Sclera are nonicteric. Neck is supple. No masses. No JVD. Lungs are clear. Cardiac exam shows an irregular rhythm. His rate is controlled.  Abdomen is soft. Extremities are without edema. Gait and ROM are intact. No gross neurologic deficits noted.  LABORATORY DATA:   Assessment / Plan:

## 2010-09-03 NOTE — Assessment & Plan Note (Addendum)
He has had a recent bout of orthostasis from dehydration and UTI. He is better. He remains on low dose Florinef. He appears stable from our standpoint. We will see him back in about 3 months. He is to call for any problems. He is to bring all of his actual medicines for review at his next visit.

## 2010-09-03 NOTE — Patient Instructions (Addendum)
Stay on your current medicines.  I think you look good heartwise We will see you back in 3 months. You will see Dr. Patty Sermons at that time. Continue to check your coumadin regularly. Call for any problems.

## 2010-09-09 NOTE — H&P (Signed)
Carlos Gonzalez, Carlos Gonzalez NO.:  [id number]  MEDICAL RECORD NO.:  [id number]  LOCATION:  [facility]                         FACILITY:  [facility]  PHYSICIAN:  Zannie Cove, MD     DATE OF BIRTH:  August 27, 1930  DATE OF ADMISSION:  08/14/2010 DATE OF DISCHARGE:                             HISTORY & PHYSICAL   PRIMARY CARE PHYSICIAN:  Carlos Dubin. Alwyn Ren, MD, FACP, FCCP  CARDIOLOGIST:  Carlos Clement, MD  NEUROLOGIST:  Carlos Churn. Love, MD  CHIEF COMPLAINT:  Dizziness.  HISTORY OF PRESENTING ILLNESS:  Carlos Gonzalez is a 77 year old gentleman with history of Parkinson disease with dizziness and gait disorder who is here with the above-mentioned complaint.  The patient reports that he has had intermittent bouts of worsening lightheadedness for awhile now, but he has noticed that his lightheadedness has worsened in the last couple of weeks.  He notices these symptoms more when he suddenly stands up or walks up the stairs too fast, also when he changes his position.  He denies any chest pain, palpitation, shortness of breath.  He denies cough, congestion, fevers, or chills.  He was at the beach today with his son and there was a PA close to where they were staying who noticed that his lips and gums were bluish tinged and also recommended evaluation in the ER.  Over here in the ER, the patient's CBC and BMET was found to be unremarkable.  He was noted to have an elevated BNP of 2172 and cloudy urine with possible UTI and [facility] were consulted for further evaluation and management.  PAST MEDICAL HISTORY:  Significant for: 1. Parkinson disease for over 12 years. 2. Gait disorder. 3. Paroxysmal atrial fibrillation, on Coumadin. 4. History of appendectomy. 5. History of hernia repair x4. 6. History of ear surgery.  MEDICATIONS:  Doses yet to be confirmed include: 1. Levodopa and carbidopa. 2. Coumadin. 3. Digoxin. 4. Fludrocortisone. 5. Loratadine. 6.  Ropinirole. 7. Selegiline.  ALLERGIES:  Include SULFA.  SOCIAL HISTORY:  He is married, lives at home with his wife.  Drinks couple of glasses of wine a week.  Denies any history of tobacco use.  FAMILY HISTORY:  Due to advanced age, is noncontributory.  REVIEW OF SYSTEMS:  Negative except per HPI.  PHYSICAL EXAMINATION:  VITAL SIGNS:  Temperature is 98.1, pulse is 86, blood pressure 149/73, respirations 18, satting 99% on room air. GENERAL:  This is an elderly white male, sitting in the stretcher, in no acute distress. HEENT:  Pupils round and reactive to light.  Extraocular movements intact.  Oral mucosa is dry. NECK:  No JVD or lymphadenopathy. CARDIOVASCULAR SYSTEM:  S1 and S2.  Regular rate and rhythm. ABDOMEN:  Soft, nontender with normal bowel sounds.  No organomegaly. EXTREMITIES:  No edema, clubbing, or cyanosis. NEUROLOGIC:  Mild pill-rolling tremors noted.  Mildly increased tone in his upper and lower extremities.  No cogwheel rigidity noted.  Gait not assessed.  Plantars are downgoing and reflexes are 2+.  REVIEW OF LABORATORY DATA:  White count of 7.6, hemoglobin 14.7, platelets 129.  PT is 29.5, INR is 2.7.  BMET is normal.  CK is 244.  MB index and troponin I  normal.  BNP is 2172.  Digoxin 0.4.  UA is cloudy with small blood, large leukocytes, 21-50 wbc's and few bacteria.  ASSESSMENT AND PLAN:  Carlos Gonzalez is a 77 year old gentleman with:  1. Dizziness in the setting of longstanding Parkinson disease.  I     suspect this is secondary to his Parkinson disease.  He could most     likely have orthostatic hypotension from autonomic neuropathy and     also contributed by the dopaminergic agent that he is on like     levodopa and carbidopa which can potentiate orthostatic hypotension     as well.  In addition, selegiline MAO-A inhibitor can also cause     orthostatic hypotension.  We will check orthostatics now and in the     morning, we will start him on gentle  IV fluids, may need a     neurological evaluation, will continue his home medications once     confirmed.  We will also get a 2D echo. 2. Possible urinary tract infection.  We will start him on IV Rocephin     and get urine cultures. 3. Elevated BNP.  The patient does not have any clinical signs of     symptoms of congestive heart failure.  If anything he appears a     little dry to me, we will get him gentle IV fluids, we will check a     2D echocardiogram, and reassess. 4. Paroxysmal atrial fibrillation, rate controlled, currently in sinus     rhythm, continue Coumadin.  INR is therapeutic.  Further management as condition evolves.     Zannie Cove, MD     PJ/MEDQ  D:  08/14/2010  T:  08/14/2010  Job:  [id number]  cc:   Carlos Dubin. Alwyn Ren, MD,FACP,FCCP Carlos Gonzalez, M.D.  Electronically Signed by Zannie Cove  on 09/09/2010 04:21:58 PM

## 2010-09-14 ENCOUNTER — Telehealth: Payer: Self-pay | Admitting: Cardiology

## 2010-09-14 NOTE — Telephone Encounter (Signed)
Scheduled INR at 10:00 and would evaluate legs then.

## 2010-09-14 NOTE — Telephone Encounter (Signed)
Pt states has red dots on legs, needs to know if he needs to come in today/tomorrow, please call pt

## 2010-09-15 ENCOUNTER — Ambulatory Visit (INDEPENDENT_AMBULATORY_CARE_PROVIDER_SITE_OTHER): Payer: Medicare Other | Admitting: Cardiology

## 2010-09-15 ENCOUNTER — Encounter: Payer: Self-pay | Admitting: Cardiology

## 2010-09-15 ENCOUNTER — Ambulatory Visit (INDEPENDENT_AMBULATORY_CARE_PROVIDER_SITE_OTHER): Payer: Medicare Other | Admitting: *Deleted

## 2010-09-15 VITALS — BP 120/80 | HR 70 | Wt 177.0 lb

## 2010-09-15 DIAGNOSIS — R42 Dizziness and giddiness: Secondary | ICD-10-CM

## 2010-09-15 DIAGNOSIS — I4891 Unspecified atrial fibrillation: Secondary | ICD-10-CM

## 2010-09-15 DIAGNOSIS — R233 Spontaneous ecchymoses: Secondary | ICD-10-CM

## 2010-09-15 LAB — POCT INR: INR: 2

## 2010-09-15 LAB — CBC WITH DIFFERENTIAL/PLATELET
Basophils Absolute: 0 10*3/uL (ref 0.0–0.1)
Eosinophils Absolute: 0.1 10*3/uL (ref 0.0–0.7)
Lymphocytes Relative: 24.9 % (ref 12.0–46.0)
MCHC: 33.6 g/dL (ref 30.0–36.0)
MCV: 95.9 fl (ref 78.0–100.0)
Monocytes Absolute: 0.6 10*3/uL (ref 0.1–1.0)
Neutrophils Relative %: 64.1 % (ref 43.0–77.0)
Platelets: 139 10*3/uL — ABNORMAL LOW (ref 150.0–400.0)
RDW: 13.6 % (ref 11.5–14.6)

## 2010-09-15 NOTE — Assessment & Plan Note (Signed)
The patient has chronic established atrial fibrillation.  He has not had any TIA symptoms.  He's not having any exertional dyspnea or signs of congestive heart failure and is not having any chest pain or angina pectoris.

## 2010-09-15 NOTE — Progress Notes (Signed)
Jordi Lacko Paolella Date of Birth:  1930-04-25 [facility] / [facility] [address]   [address] [place], [place]  [address] [phone]           Fax   [phone]  HPI: This pleasant 77 year old gentleman is seen as a work in office visit today.  He had come in for his prothrombin time which is therapeutic at 2.0.  However he wanted me to look at the skin on his legs.  On the upper aspect of both legs just below the knee he has a confluent patch of petechiae.  He denies any trauma to the area.  He does not seem to have any petechiae anywhere else.  He is not on any new medication.  He has had no recurrence of his weakness and dizziness since his urinary tract infection was discovered and treated in the hospital last month  Current Outpatient Prescriptions  Medication Sig Dispense Refill  . CARBIDOPA-LEVODOPA PO Take 25-100 mg by mouth 4 (four) times daily.       . citalopram (CELEXA) 20 MG tablet Take 20 mg by mouth daily.        . digoxin (LANOXIN) 0.125 MG tablet Take 125 mcg by mouth daily.        . fludrocortisone (FLORINEF) 0.1 MG tablet Take 0.1 mg by mouth daily.        . Multiple Vitamin (MULTIVITAMIN PO) Take by mouth.        Marland Kitchen rOPINIRole (REQUIP) 3 MG tablet Take 3 mg by mouth at bedtime.        . selegiline (ELDEPRYL) 5 MG tablet Take 5 mg by mouth daily.        Marland Kitchen warfarin (COUMADIN) 5 MG tablet Take 5 mg by mouth. As directed per coumadin clinic.         Allergies  Allergen Reactions  . Sulfonamide Derivatives     Patient Active Problem List  Diagnoses  . HYPOTHYROIDISM  . HYPERKALEMIA  . GILBERT'S SYNDROME  . COAGULOPATHY, COUMADIN-INDUCED  . PARKINSON'S DISEASE  . ATRIAL FIBRILLATION  . ALLERGIC RHINITIS CAUSE UNSPECIFIED  . PNEUMONIA, RIGHT LOWER LOBE  . STRICTURE AND STENOSIS OF ESOPHAGUS  . BENIGN PROSTATIC HYPERTROPHY  . DIZZINESS  . SLEEP DISORDER  . WEIGHT LOSS  . DYSPHAGIA UNSPECIFIED  . ABDOMINAL PAIN, LEFT LOWER QUADRANT  .  Nonspecific (abnormal) findings on radiological and other examination of body structure  . UNS ADVRS EFF UNS RX MEDICINAL&BIOLOGICAL SBSTNC  . SKIN CANCER, HX OF  . Personal history of colonic polyps  . BENIGN PROSTATIC HYPERTROPHY, HX OF  . CHEST XRAY, ABNORMAL  . Orthostatic hypotension    History  Smoking status  . Never Smoker   Smokeless tobacco  . Not on file    History  Alcohol Use  . Yes    Social    No family history on file.  Review of Systems: The patient denies any heat or cold intolerance.  No weight gain or weight loss.  The patient denies headaches or blurry vision.  There is no cough or sputum production.  The patient denies dizziness.  There is no hematuria or hematochezia.  The patient denies any muscle aches or arthritis.  The patient denies any rash.  The patient denies frequent falling or instability.  There is no history of depression or anxiety.  All other systems were reviewed and are negative.   Physical Exam: Filed Vitals:   09/15/10 1103  BP: 120/80  Pulse: 70  The general appearance reveals a well-developed well-nourished gentleman in no distress.Pupils equal and reactive.   Extraocular Movements are full.  There is no scleral icterus.  The mouth and pharynx are normal.  The neck is supple.  The carotids reveal no bruits.  The jugular venous pressure is normal.  The thyroid is not enlarged.  There is no lymphadenopathy.  Pupils equal and reactive.   Extraocular Movements are full.  There is no scleral icterus.  The mouth and pharynx are normal.  The neck is supple.  The carotids reveal no bruits.  The jugular venous pressure is normal.  The thyroid is not enlarged.  There is no lymphadenopathy.  Pupils equal and reactive.   Extraocular Movements are full.  There is no scleral icterus.  The mouth and pharynx are normal.  The neck is supple.  The carotids reveal no bruits.  The jugular venous pressure is normal.  The thyroid is not enlarged.  There is no  lymphadenopathy.The rhythm is irregular in atrial fibrillation. Pupils equal and reactive.   Extraocular Movements are full.  There is no scleral icterus.  The mouth and pharynx are normal.  The neck is supple.  The carotids reveal no bruits.  The jugular venous pressure is normal.  The thyroid is not enlarged.  There is no lymphadenopathy.  Normal extremity without phlebitis or edema.  He does have confluent areas of petechiae just below his knees bilaterally but no evidence of phlebitis or cellulitis.      Assessment / Plan: VER GI venous capillary fragility or petechiae his not clear at this time.  The fact that it is so localized suggests possibly unrecognized trauma or pressure on that part of his leg.  His INR is at the low end of therapeutic range at 2.0 and we will continue same Coumadin.  We are checking a CBC today to see what his platelet count is.  No change in meds at this point.

## 2010-09-15 NOTE — Assessment & Plan Note (Signed)
The patient has not been expressing any recent episodes of dizziness or syncope.  His blood pressure today is satisfactory.

## 2010-09-16 ENCOUNTER — Encounter: Payer: Medicare Other | Admitting: *Deleted

## 2010-09-27 ENCOUNTER — Other Ambulatory Visit: Payer: Self-pay | Admitting: Internal Medicine

## 2010-09-28 MED ORDER — CITALOPRAM HYDROBROMIDE 20 MG PO TABS: 20.0000 mg | ORAL_TABLET | Freq: Every day | ORAL | Status: DC

## 2010-09-28 NOTE — Telephone Encounter (Signed)
RX sent to pharmacy  

## 2010-10-06 ENCOUNTER — Encounter: Payer: Self-pay | Admitting: Internal Medicine

## 2010-10-15 ENCOUNTER — Ambulatory Visit (INDEPENDENT_AMBULATORY_CARE_PROVIDER_SITE_OTHER): Payer: Medicare Other | Admitting: *Deleted

## 2010-10-15 DIAGNOSIS — I4891 Unspecified atrial fibrillation: Secondary | ICD-10-CM

## 2010-10-15 LAB — POCT INR: INR: 1.2

## 2010-10-25 ENCOUNTER — Ambulatory Visit (INDEPENDENT_AMBULATORY_CARE_PROVIDER_SITE_OTHER): Payer: Medicare Other | Admitting: *Deleted

## 2010-10-25 DIAGNOSIS — I4891 Unspecified atrial fibrillation: Secondary | ICD-10-CM

## 2010-10-31 ENCOUNTER — Other Ambulatory Visit: Payer: Self-pay | Admitting: Cardiology

## 2010-10-31 DIAGNOSIS — I4891 Unspecified atrial fibrillation: Secondary | ICD-10-CM

## 2010-11-02 NOTE — Telephone Encounter (Signed)
escribe request  

## 2010-11-04 ENCOUNTER — Encounter: Payer: Medicare Other | Admitting: *Deleted

## 2010-12-06 ENCOUNTER — Ambulatory Visit (INDEPENDENT_AMBULATORY_CARE_PROVIDER_SITE_OTHER): Payer: Medicare Other | Admitting: *Deleted

## 2010-12-06 ENCOUNTER — Encounter: Payer: Self-pay | Admitting: Cardiology

## 2010-12-06 ENCOUNTER — Ambulatory Visit (INDEPENDENT_AMBULATORY_CARE_PROVIDER_SITE_OTHER): Payer: Medicare Other | Admitting: Cardiology

## 2010-12-06 VITALS — BP 120/78 | HR 70 | Ht 73.0 in | Wt 178.0 lb

## 2010-12-06 DIAGNOSIS — R42 Dizziness and giddiness: Secondary | ICD-10-CM

## 2010-12-06 DIAGNOSIS — I4891 Unspecified atrial fibrillation: Secondary | ICD-10-CM

## 2010-12-06 DIAGNOSIS — G20 Parkinson's disease: Secondary | ICD-10-CM

## 2010-12-06 NOTE — Progress Notes (Signed)
Dontavis Tschantz Riggsbee Date of Birth:  Dec 09, 1930 [facility] / [facility] [address]   [address] [place], [place]  [address] [phone]           Fax   [phone]  HPI: This pleasant 77 year old gentleman is seen for a scheduled followup office visit.  The patient has a history of atrial fibrillation.  He is on Coumadin.  His last prothrombin time was in early September and was elevated at that time.  He failed to show for his next appointment.  We are sending him for an INR today.  Patient denies any chest pain or shortness of breath.  He does have chronic dizziness, which is multifactorial.  He does have a history of Parkinson's and also history of orthostatic hypotension.  He has not been having any episodes of syncope.  Current Outpatient Prescriptions  Medication Sig Dispense Refill  . CARBIDOPA-LEVODOPA PO Take 25-100 mg by mouth 4 (four) times daily.       . cetirizine (ZYRTEC) 10 MG tablet Take 10 mg by mouth daily.        . citalopram (CELEXA) 20 MG tablet Take 20 mg by mouth daily. Take 1/2 tablet daily       . dextromethorphan-guaiFENesin (MUCINEX DM) 30-600 MG per 12 hr tablet Take 1 tablet by mouth as needed.        . digoxin (LANOXIN) 0.25 MG tablet TAKE 1/2 TABLET EVERY DAY  100 tablet  3  . fludrocortisone (FLORINEF) 0.1 MG tablet Take 0.1 mg by mouth daily.        Marland Kitchen rOPINIRole (REQUIP) 1 MG tablet Take 1 mg by mouth. Take tab TID & 2 tabs QHS       . selegiline (ELDEPRYL) 5 MG tablet Take 5 mg by mouth daily.        Marland Kitchen warfarin (COUMADIN) 5 MG tablet Take 5 mg by mouth. As directed per coumadin clinic.         Allergies  Allergen Reactions  . Sulfonamide Derivatives     Patient Active Problem List  Diagnoses  . HYPOTHYROIDISM  . HYPERKALEMIA  . GILBERT'S SYNDROME  . COAGULOPATHY, COUMADIN-INDUCED  . PARKINSON'S DISEASE  . ATRIAL FIBRILLATION  . ALLERGIC RHINITIS CAUSE UNSPECIFIED  . PNEUMONIA, RIGHT LOWER LOBE  . STRICTURE AND STENOSIS OF  ESOPHAGUS  . BENIGN PROSTATIC HYPERTROPHY  . DIZZINESS  . SLEEP DISORDER  . WEIGHT LOSS  . DYSPHAGIA UNSPECIFIED  . ABDOMINAL PAIN, LEFT LOWER QUADRANT  . Nonspecific (abnormal) findings on radiological and other examination of body structure  . UNS ADVRS EFF UNS RX MEDICINAL&BIOLOGICAL SBSTNC  . SKIN CANCER, HX OF  . Personal history of colonic polyps  . BENIGN PROSTATIC HYPERTROPHY, HX OF  . CHEST XRAY, ABNORMAL  . Orthostatic hypotension    History  Smoking status  . Never Smoker   Smokeless tobacco  . Not on file    History  Alcohol Use  . Yes    Social    Family History  Problem Relation Age of Onset  . Mental illness Mother     ALSHEIMERS  . Heart disease Father     CHF  . Heart disease Paternal Grandfather     MI  . Cancer Brother     LUNG  . Heart disease Brother     BY-PASS    Review of Systems: The patient denies any heat or cold intolerance.  No weight gain or weight loss.  The patient denies headaches or  blurry vision.  There is no cough or sputum production.  The patient denies dizziness.  There is no hematuria or hematochezia.  The patient denies any muscle aches or arthritis.  The patient denies any rash.  The patient denies frequent falling or instability.  There is no history of depression or anxiety.  All other systems were reviewed and are negative.   Physical Exam: Filed Vitals:   12/06/10 1542  BP: 120/78  Pulse: 70   the general appearance reveals a well-developed, well-nourished, gentleman in no distress.Pupils equal and reactive.   Extraocular Movements are full.  There is no scleral icterus.  The mouth and pharynx are normal.  The neck is supple.  The carotids reveal no bruits.  The jugular venous pressure is normal.  The thyroid is not enlarged.  There is no lymphadenopathy.  The chest is clear to percussion and auscultation. There are no rales or rhonchi. Expansion of the chest is symmetrical.  The precordium is quiet.  The first  heart sound is normal.  The second heart sound is physiologically split.  There is no murmur gallop rub or click.  There is no abnormal lift or heave.  Her rhythm is irregularThe abdomen is soft and nontender. Bowel sounds are normal. The liver and spleen are not enlarged. There Are no abdominal masses. There are no bruits.  The pedal pulses are good.  There is no phlebitis or edema.  There is no cyanosis or clubbing. The skin is warm and dry.  There is no rash.       Assessment / Plan:  We will send him for a prothrombin time today.  He will return for a followup office visit in 4 months.  We will get an electrocardiogram at that time.

## 2010-12-06 NOTE — Assessment & Plan Note (Signed)
Patient has chronic atrial fibrillation.  He has not had any symptoms to suggest TIA or stroke.  His recent Coumadin levels have been supratherapeutic.  He's not had any hemorrhage or melena or hematochezia.

## 2010-12-06 NOTE — Patient Instructions (Signed)
Will get lab today (coumadin check)  Your physician recommends that you continue on your current medications as directed. Please refer to the Current Medication list given to you today.  Your physician recommends that you schedule a follow-up appointment in: 4 months

## 2010-12-06 NOTE — Assessment & Plan Note (Signed)
The patient has a history of Parkinson's disease.  This is followed by Dr. love.  The patient has been having some hallucinations, which he states is a side effect of the Parkinson's medicines, and he has discussed this with Dr. love

## 2010-12-16 ENCOUNTER — Ambulatory Visit (INDEPENDENT_AMBULATORY_CARE_PROVIDER_SITE_OTHER): Payer: Medicare Other | Admitting: *Deleted

## 2010-12-16 DIAGNOSIS — I4891 Unspecified atrial fibrillation: Secondary | ICD-10-CM

## 2010-12-16 LAB — POCT INR: INR: 2.3

## 2010-12-31 ENCOUNTER — Encounter: Payer: Medicare Other | Admitting: *Deleted

## 2011-03-20 IMAGING — CT CT CHEST W/ CM
2 of 4 series · 15 of 36 positions shown, 18 images · IV contrast (Omnipaque 300)
Comparison: Chest x-rays [DATE] and 06/08/2009

CLINICAL DATA: Persistent right lower lobe airspace density.

CT CHEST WITH CONTRAST
TECHNIQUE: Multidetector CT imaging of the chest was performed
following the standard protocol during bolus administration of
intravenous contrast.
Contrast: 80 ml Dmnipaque-GCC

[Series 2: chest routine with · axial · 0.75mm/px · z∈[-383,-33]mm · 12 of 84 slices shown, 15 images]
[im 7/84  mediastinal]
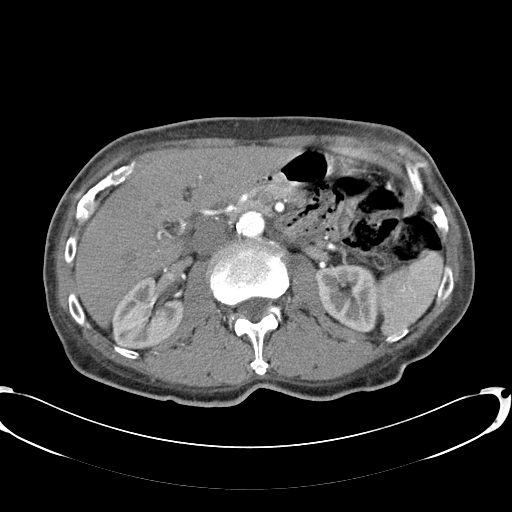
[im 7/84  lung]
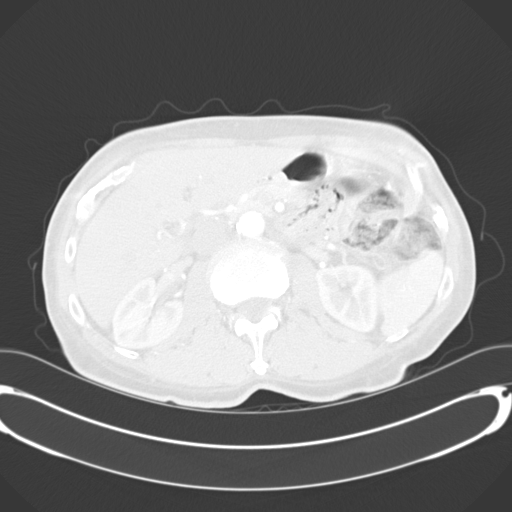
[im 13/84  lung]
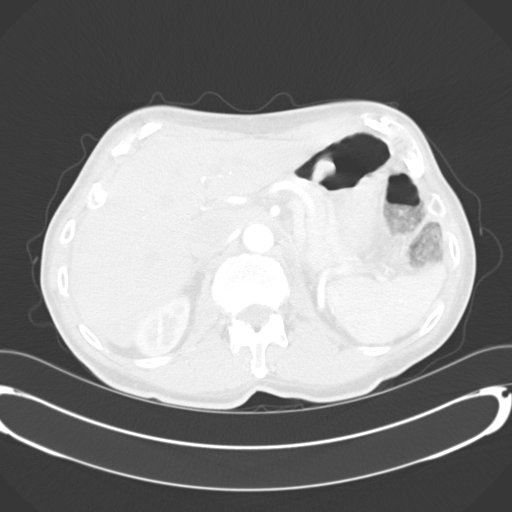
[im 20/84  lung]
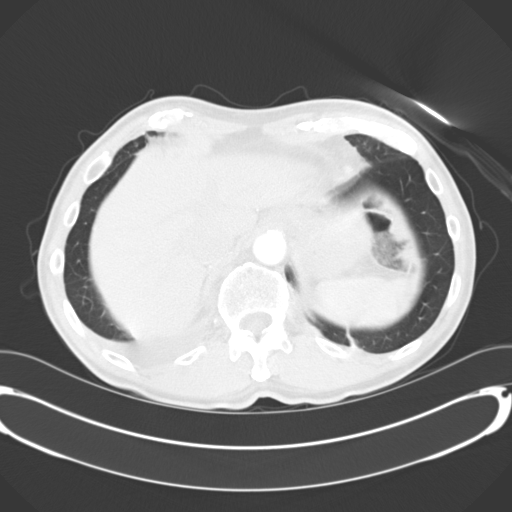
[im 26/84  lung]
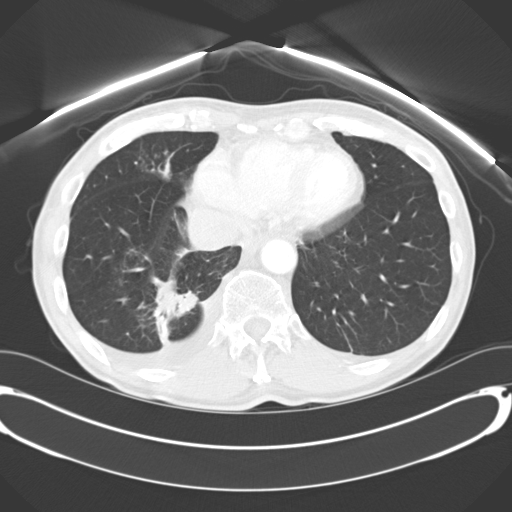
[im 32/84  mediastinal]
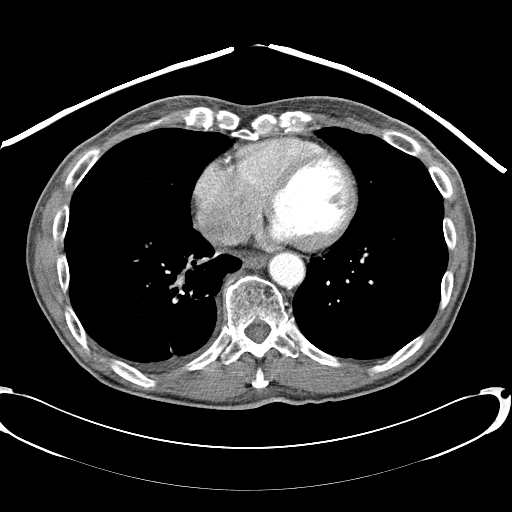
[im 32/84  lung]
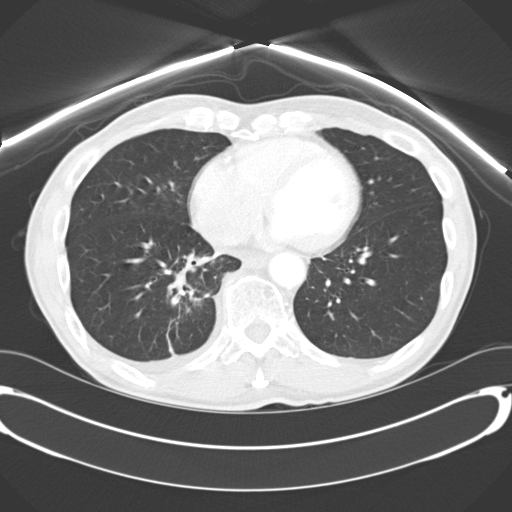
[im 39/84  lung]
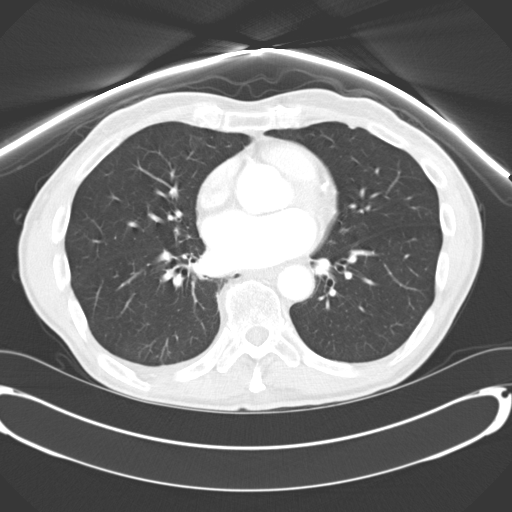
[im 45/84  lung]
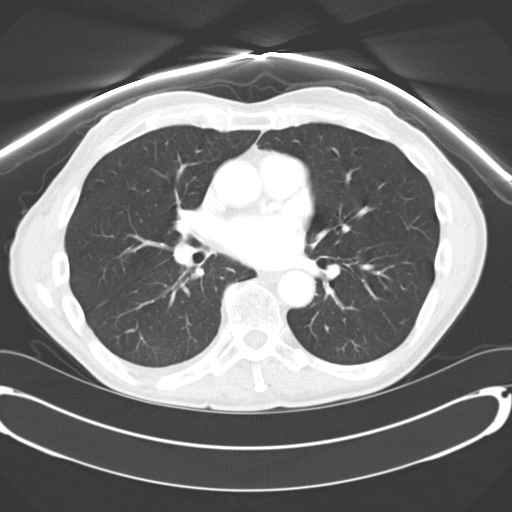
[im 52/84  lung]
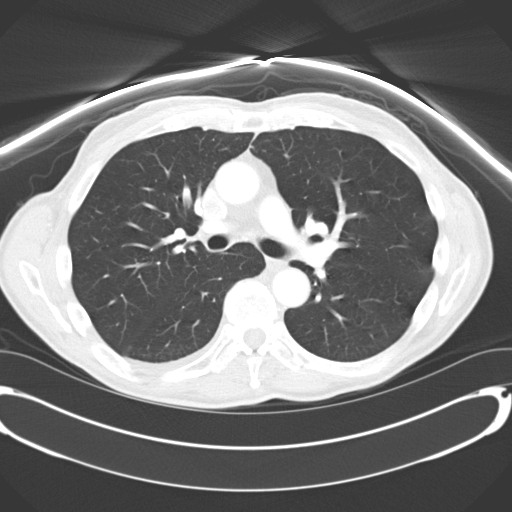
[im 58/84  mediastinal]
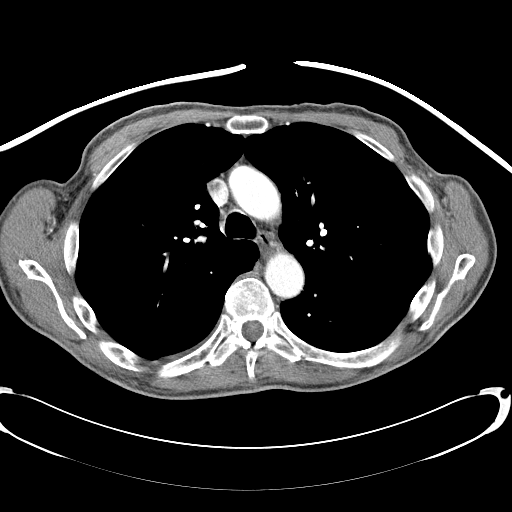
[im 58/84  lung]
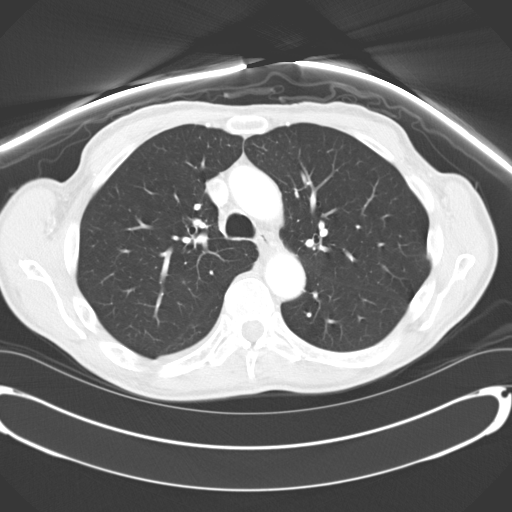
[im 64/84  lung]
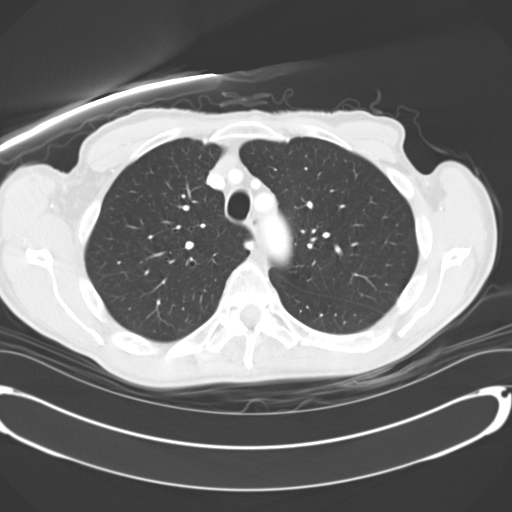
[im 71/84  lung]
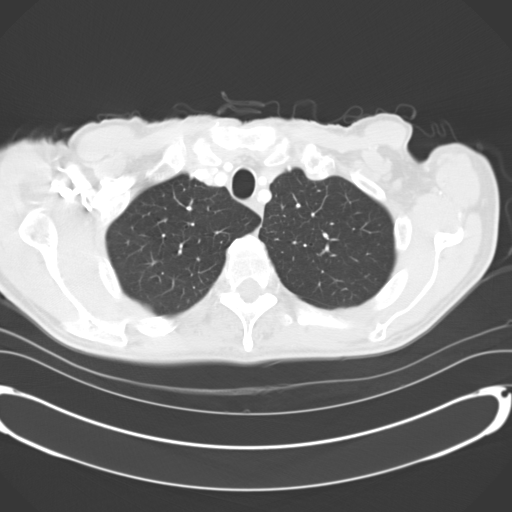
[im 77/84  lung]
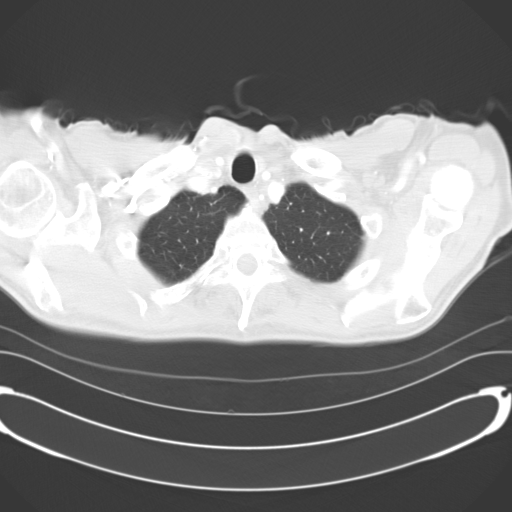

[Series 602: <mpr range> · coronal · 0.81mm/px · 3 of 124 slices shown]
[im 25/124  lung]
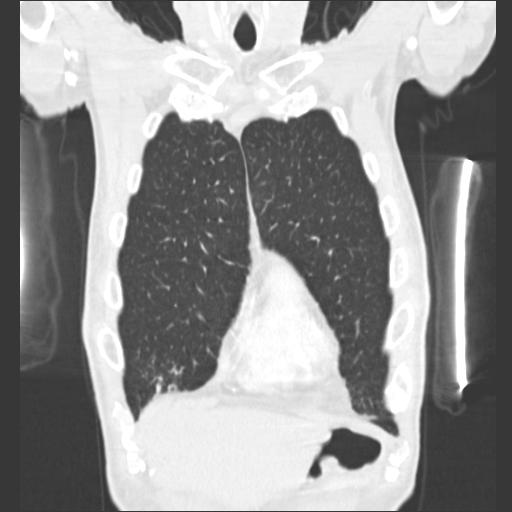
[im 50/124  lung]
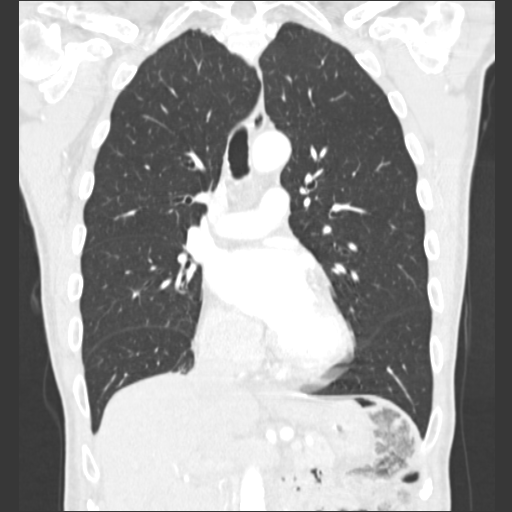
[im 74/124  lung]
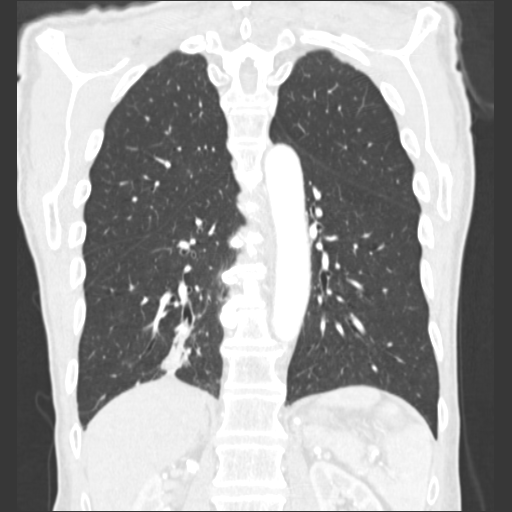

[15 of 36 positions shown; findings below may reference images not displayed]

FINDINGS: The chest wall is unremarkable.  No supraclavicular or
axillary adenopathy.  The bony thorax is intact.

The heart is normal in size.  No pericardial effusion.  No
mediastinal or hilar adenopathy.  The aorta is normal in caliber.
No dissection.  Minimal atherosclerotic changes.  Coronary artery
calcifications are noted.

Examination of the lung parenchyma demonstrates streaky ill-defined
airspace opacity in the right lower lobe consistent with pneumonia.
There are air bronchograms and a small associated parapneumonic
effusion.  No discrete soft tissue mass/lung nodule.  The
tracheobronchial tree is grossly normal.  The remainder the lungs
are clear.  Minimal streaky right middle lobe atelectasis or
scarring.

The upper abdomen demonstrates no significant abnormalities.
IMPRESSION: 1.  Right lower lobe pneumonia and parapneumonic effusion.
Recommend a post-treatment follow-up chest x-ray to make sure this
resolves.
2.  No mediastinal or hilar adenopathy.
3.  Normal appearance of the heart and great vessels.  Minimal
atherosclerotic changes involving the aorta.

## 2011-03-31 ENCOUNTER — Other Ambulatory Visit: Payer: Self-pay | Admitting: Dermatology

## 2011-04-08 ENCOUNTER — Other Ambulatory Visit: Payer: Self-pay | Admitting: Dermatology

## 2011-04-14 ENCOUNTER — Ambulatory Visit (INDEPENDENT_AMBULATORY_CARE_PROVIDER_SITE_OTHER): Payer: Medicare Other | Admitting: Nurse Practitioner

## 2011-04-14 ENCOUNTER — Ambulatory Visit (INDEPENDENT_AMBULATORY_CARE_PROVIDER_SITE_OTHER): Payer: Medicare Other | Admitting: *Deleted

## 2011-04-14 ENCOUNTER — Encounter: Payer: Self-pay | Admitting: Nurse Practitioner

## 2011-04-14 VITALS — BP 110/72 | HR 71 | Ht 73.0 in | Wt 174.0 lb

## 2011-04-14 DIAGNOSIS — I4891 Unspecified atrial fibrillation: Secondary | ICD-10-CM

## 2011-04-14 DIAGNOSIS — R0989 Other specified symptoms and signs involving the circulatory and respiratory systems: Secondary | ICD-10-CM

## 2011-04-14 LAB — CBC WITH DIFFERENTIAL/PLATELET
Basophils Absolute: 0 10*3/uL (ref 0.0–0.1)
Basophils Relative: 0.6 % (ref 0.0–3.0)
Eosinophils Absolute: 0.1 10*3/uL (ref 0.0–0.7)
Eosinophils Relative: 0.9 % (ref 0.0–5.0)
HCT: 42.5 % (ref 39.0–52.0)
Hemoglobin: 14.3 g/dL (ref 13.0–17.0)
Lymphocytes Relative: 25 % (ref 12.0–46.0)
Lymphs Abs: 1.6 10*3/uL (ref 0.7–4.0)
MCHC: 33.5 g/dL (ref 30.0–36.0)
MCV: 95.1 fl (ref 78.0–100.0)
Monocytes Absolute: 0.7 10*3/uL (ref 0.1–1.0)
Monocytes Relative: 10.6 % (ref 3.0–12.0)
Neutro Abs: 4 10*3/uL (ref 1.4–7.7)
Neutrophils Relative %: 62.9 % (ref 43.0–77.0)
Platelets: 161 10*3/uL (ref 150.0–400.0)
RBC: 4.47 Mil/uL (ref 4.22–5.81)
RDW: 13.7 % (ref 11.5–14.6)
WBC: 6.4 10*3/uL (ref 4.5–10.5)

## 2011-04-14 LAB — BASIC METABOLIC PANEL
BUN: 26 mg/dL — ABNORMAL HIGH (ref 6–23)
CO2: 26 mEq/L (ref 19–32)
Calcium: 9 mg/dL (ref 8.4–10.5)
Chloride: 106 mEq/L (ref 96–112)
Creatinine, Ser: 1.2 mg/dL (ref 0.4–1.5)
GFR: 60.88 mL/min (ref >60.00)
Glucose, Bld: 89 mg/dL (ref 70–99)
Potassium: 4.3 mEq/L (ref 3.5–5.1)
Sodium: 139 mEq/L (ref 135–145)

## 2011-04-14 LAB — BRAIN NATRIURETIC PEPTIDE: Pro B Natriuretic peptide (BNP): 135 pg/mL — ABNORMAL HIGH (ref 0.0–100.0)

## 2011-04-14 LAB — POCT INR: INR: 1.9

## 2011-04-14 LAB — TSH: TSH: 3.26 u[IU]/mL (ref 0.35–5.50)

## 2011-04-14 NOTE — Patient Instructions (Signed)
We are going to get your labs checked today.  We will get your protime checked today. You need to get your coumadin checked every month.  We will see you back in about 4 months.

## 2011-04-14 NOTE — Assessment & Plan Note (Signed)
This is chronic. He remains on coumadin. Has been noncompliant once again with checking his INR. Will get an INR today. Also getting baseline labs today as well. I have informed the son that I am no able to do the competency testing. Will make arrangements for the patient to get back in with Dr. Alwyn Ren. We will see him back in about 4 months. There was no charge for today's visit due to the miscommunication on our part. Patient's son is agreeable to this plan and will call if any problems develop in the interim.

## 2011-04-14 NOTE — Progress Notes (Signed)
Carlos Gonzalez Date of Birth: 04-Jun-1930 Medical Record #[id number]  History of Present Illness: Carlos Gonzalez is seen today for a work in visit. He is seen for Carlos Gonzalez. He has chronic atrial fib, Parkinson's and a history of orthostatic hypotension. He is on chronic Florinef. He is here with his son Carlos Gonzalez today for "compentency testing".  His son reports that he is trying to get his affairs in order due to personal reasons that are not disclosed to me. Needs the competency testing in order to facilitate these affairs and has an appointment with the attorney for later today.   From a cardiac standpoint, he seems to be holding his own. Still gets a little lightheaded. Still gets a little short of breath. Most of the history is derived from the son. No recent falls. Has not had his coumadin checked since November. No bleeding or bruising. Admits that he gets his medicines "messed up".   Current Outpatient Prescriptions on File Prior to Visit  Medication Sig Dispense Refill  . CARBIDOPA-LEVODOPA PO Take 25-100 mg by mouth 4 (four) times daily.       . cetirizine (ZYRTEC) 10 MG tablet Take 10 mg by mouth daily.        . citalopram (CELEXA) 20 MG tablet Take 20 mg by mouth daily. Take 1/2 tablet daily       . dextromethorphan-guaiFENesin (MUCINEX DM) 30-600 MG per 12 hr tablet Take 1 tablet by mouth as needed.        . digoxin (LANOXIN) 0.25 MG tablet TAKE 1/2 TABLET EVERY DAY  100 tablet  3  . fludrocortisone (FLORINEF) 0.1 MG tablet Take 0.1 mg by mouth daily.        Marland Kitchen rOPINIRole (REQUIP) 1 MG tablet Take 1 mg by mouth. Take tab TID & 2 tabs QHS       . selegiline (ELDEPRYL) 5 MG tablet Take 5 mg by mouth daily.        Marland Kitchen warfarin (COUMADIN) 5 MG tablet Take 5 mg by mouth. As directed per coumadin clinic.         Allergies  Allergen Reactions  . Sulfonamide Derivatives     Past Medical History  Diagnosis Date  . Parkinson's disease   . Hypertension   . Parkinson's disease   .  Hypothyroidism     Caused by Amiodarone, resolved  . Orthostatic hypotension     Chronic  . Atrial fibrillation   . Chronic anticoagulation   . BPH (benign prostatic hyperplasia)   . Cancer     hx of skin cancer;? basal cell  . Gastritis     Past Surgical History  Procedure Date  . Hernia repair 78yrs old, Age 66  . Appendectomy Age 63  . Knee surgery Age 46  . US echocardiography July 2012    EF 55-60%  . Cardiovascular stress test 09-04-2003    EF 66%    History  Smoking status  . Never Smoker   Smokeless tobacco  . Not on file    History  Alcohol Use  . Yes    Social    Family History  Problem Relation Age of Onset  . Mental illness Mother     ALSHEIMERS  . Heart disease Father     CHF  . Heart disease Paternal Grandfather     MI  . Cancer Brother     LUNG  . Heart disease Brother     BY-PASS    Review of  Systems: The review of systems is per the HPI.  All other systems were reviewed and are negative.  Physical Exam: BP 110/72  Pulse 71  Ht 6\' 1"  (1.854 m)  Wt 174 lb (78.926 kg)  BMI 22.96 kg/m2 Patient is pleasant and in no acute distress. He is a poor historian. Gets confused easily about the topic of discussion. Skin is warm and dry. Color is normal. Multiple skin lesions on his head noted.  HEENT is unremarkable. Normocephalic/atraumatic. PERRL. Sclera are nonicteric. Neck is supple. No masses. No JVD. Lungs are clear. Cardiac exam shows an irregular rhythm. His rate is controlled. Abdomen is soft. Extremities are without edema. Gait and ROM are intact. No gross neurologic deficits noted.   LABORATORY DATA: EKG shows atrial fib with a controlled ventricular response.    Assessment / Plan:

## 2011-04-15 ENCOUNTER — Telehealth: Payer: Self-pay | Admitting: *Deleted

## 2011-04-15 NOTE — Telephone Encounter (Signed)
Message copied by Burnell Blanks on Fri Apr 15, 2011  3:20 PM ------      Message from: Rosalio Macadamia      Created: Thu Apr 14, 2011  8:23 PM       Ok to report. Labs are satisfactory.

## 2011-04-15 NOTE — Telephone Encounter (Signed)
Mailed copy of labs and left message to call if any questions  

## 2011-04-19 ENCOUNTER — Ambulatory Visit: Payer: Medicare Other | Admitting: Internal Medicine

## 2011-04-20 ENCOUNTER — Encounter: Payer: Self-pay | Admitting: Internal Medicine

## 2011-04-20 ENCOUNTER — Ambulatory Visit (INDEPENDENT_AMBULATORY_CARE_PROVIDER_SITE_OTHER): Payer: Medicare Other | Admitting: Internal Medicine

## 2011-04-20 VITALS — BP 102/82 | HR 70 | Temp 97.5°F | Ht 73.0 in | Wt 177.0 lb

## 2011-04-20 DIAGNOSIS — G20 Parkinson's disease: Secondary | ICD-10-CM

## 2011-04-20 NOTE — Progress Notes (Signed)
  Subjective:    Patient ID: Carlos Gonzalez, male    DOB: [birth year], 78 y.o.   MRN: [id number]  HPI He is here for mental status evaluation as he plans to complete his will and healthcare power of attorney.  He does have Parkinson's and is followed by Dr Sandria Manly, Neurology ; he has no history of seizures or cerebrovascular accident.  His maternal grandmother, mother, and 2 maternal uncles had "hardening of arteries" with dementia  Review of Systems   His neurologist has recommended that his driving privileges be rescinded because of possible hypersomnolence with medication     Objective:   Physical Exam He is alert and communicative and in no distress.  Mini-Mental Status exam was completed. Initially he scored 27/30; with repeat testing his score was 28. Clock drawing test was good.  As noted above he is a lucid historian.  He does not exhibit classic Parkinsonian stigmata. He does have a slight stare but not definite mass bases. No tremor is present.  He exhibits an S4 with slurring. No carotid bruits are noted        Assessment & Plan:

## 2011-04-20 NOTE — Assessment & Plan Note (Signed)
There is no evidence dementia; the items he misses on Mini-Mental Status examination relate to recent recall. Performance tests were excellent

## 2011-04-20 NOTE — Patient Instructions (Signed)
.  Share results with Dr Love  

## 2011-04-29 ENCOUNTER — Ambulatory Visit (INDEPENDENT_AMBULATORY_CARE_PROVIDER_SITE_OTHER): Payer: Medicare Other | Admitting: Pharmacist

## 2011-04-29 DIAGNOSIS — I4891 Unspecified atrial fibrillation: Secondary | ICD-10-CM

## 2011-04-29 LAB — POCT INR: INR: 2.8

## 2011-05-10 ENCOUNTER — Ambulatory Visit (INDEPENDENT_AMBULATORY_CARE_PROVIDER_SITE_OTHER): Payer: Medicare Other | Admitting: Internal Medicine

## 2011-05-10 ENCOUNTER — Encounter: Payer: Self-pay | Admitting: Internal Medicine

## 2011-05-10 ENCOUNTER — Telehealth: Payer: Self-pay | Admitting: Internal Medicine

## 2011-05-10 VITALS — BP 102/70 | HR 84 | Wt 177.8 lb

## 2011-05-10 DIAGNOSIS — K222 Esophageal obstruction: Secondary | ICD-10-CM

## 2011-05-10 DIAGNOSIS — I951 Orthostatic hypotension: Secondary | ICD-10-CM

## 2011-05-10 DIAGNOSIS — T887XXA Unspecified adverse effect of drug or medicament, initial encounter: Secondary | ICD-10-CM

## 2011-05-10 DIAGNOSIS — G20 Parkinson's disease: Secondary | ICD-10-CM

## 2011-05-10 NOTE — Assessment & Plan Note (Signed)
Repeat the isometric exercises discussed 4- 5 times prior to standing if you've been seated for a period of time.  

## 2011-05-10 NOTE — Assessment & Plan Note (Signed)
At this time he denies significant reflux symptoms or dysphagia

## 2011-05-10 NOTE — Telephone Encounter (Signed)
Patients son Carlos Gonzalez spoke to [facility] yesterday & she called him back late in the afternoon. He was returning her call but I do not see anything in the chart from my side that may be able to help you  [name] ph# [phone]

## 2011-05-10 NOTE — Progress Notes (Signed)
  Subjective:    Patient ID: Carlos Gonzalez, male    DOB: [birth year], 78 y.o.   MRN: [id number]  HPI He was seen recently for competency evaluation; he is fully competent to make his medical decisions. His family has been placing medications in pill box for his daily administration.  He has stopped driving at the urging of his neurologist because of hallucinations related to the Parkinson's medications.  He admits to some depression related to his condition, but this is not debilitating. He declines meds & he  looks forward to entering the facility support program. Options available were reviewed and recommendations made.  He has not had significant falls but balance and gait evaluation through  physical therapy would be appropriate  There are no issues related to bladder or bowel control or incontinence.  At this time he needs no assistance in activities of daily living.      Review of Systems     Objective:   Physical Exam he is appropriate and interactive. He is oriented x3.  There is suggestion of some parkinsonian facies but he does not exhibit any cogwheeling of extremities.  There is crepitus of the left knee without effusion.  He has no lymphadenopathy about the neck or axilla  Thyroid is normal to palpation  He has an irregular, irregular rhythm with controlled ventricular rate  He has no cyanosis, clubbing or edema        Assessment & Plan:  The form from [facility] was completed. The record was updated and copy provided to him to provide the facility.  He is an excellent historian and oriented as noted. There is some question about his exact medications and doses. Direct comparison of the list with the pill bottles will be necessary.

## 2011-05-10 NOTE — Patient Instructions (Signed)
.  Share records with  Hassel Neth

## 2011-05-10 NOTE — Telephone Encounter (Signed)
Spoke with patient's son, patient to be seen today at 4:30 pm

## 2011-05-20 ENCOUNTER — Ambulatory Visit (INDEPENDENT_AMBULATORY_CARE_PROVIDER_SITE_OTHER): Payer: Medicare Other | Admitting: Pharmacist

## 2011-05-20 DIAGNOSIS — I4891 Unspecified atrial fibrillation: Secondary | ICD-10-CM

## 2011-05-23 ENCOUNTER — Other Ambulatory Visit: Payer: Self-pay | Admitting: *Deleted

## 2011-05-23 MED ORDER — WARFARIN SODIUM 5 MG PO TABS: 5.0000 mg | ORAL_TABLET | ORAL | Status: DC

## 2011-05-24 ENCOUNTER — Telehealth: Payer: Self-pay | Admitting: Internal Medicine

## 2011-05-24 MED ORDER — CITALOPRAM HYDROBROMIDE 20 MG PO TABS: 20.0000 mg | ORAL_TABLET | Freq: Every day | ORAL | Status: DC

## 2011-05-24 NOTE — Telephone Encounter (Signed)
Refill: Citalopram 20mg  #30. 1 tablet by mouth every day @ 8am

## 2011-05-24 NOTE — Telephone Encounter (Signed)
RX sent

## 2011-06-07 ENCOUNTER — Inpatient Hospital Stay (HOSPITAL_BASED_OUTPATIENT_CLINIC_OR_DEPARTMENT_OTHER)
Admission: EM | Admit: 2011-06-07 | Discharge: 2011-06-12 | DRG: 948 | Disposition: A | Discharge status: Home Health | Payer: Medicare Other | Attending: Internal Medicine | Admitting: Internal Medicine

## 2011-06-07 ENCOUNTER — Emergency Department (INDEPENDENT_AMBULATORY_CARE_PROVIDER_SITE_OTHER): Payer: Medicare Other

## 2011-06-07 ENCOUNTER — Encounter (HOSPITAL_BASED_OUTPATIENT_CLINIC_OR_DEPARTMENT_OTHER): Payer: Self-pay | Admitting: Emergency Medicine

## 2011-06-07 DIAGNOSIS — E875 Hyperkalemia: Secondary | ICD-10-CM

## 2011-06-07 DIAGNOSIS — N400 Enlarged prostate without lower urinary tract symptoms: Secondary | ICD-10-CM

## 2011-06-07 DIAGNOSIS — G20A1 Parkinson's disease without dyskinesia, without mention of fluctuations: Secondary | ICD-10-CM

## 2011-06-07 DIAGNOSIS — G20 Parkinson's disease: Secondary | ICD-10-CM

## 2011-06-07 DIAGNOSIS — G479 Sleep disorder, unspecified: Secondary | ICD-10-CM

## 2011-06-07 DIAGNOSIS — R5381 Other malaise: Secondary | ICD-10-CM

## 2011-06-07 DIAGNOSIS — J309 Allergic rhinitis, unspecified: Secondary | ICD-10-CM

## 2011-06-07 DIAGNOSIS — N390 Urinary tract infection, site not specified: Secondary | ICD-10-CM | POA: Diagnosis present

## 2011-06-07 DIAGNOSIS — R930 Abnormal findings on diagnostic imaging of skull and head, not elsewhere classified: Secondary | ICD-10-CM

## 2011-06-07 DIAGNOSIS — R5383 Other fatigue: Secondary | ICD-10-CM

## 2011-06-07 DIAGNOSIS — I4891 Unspecified atrial fibrillation: Secondary | ICD-10-CM

## 2011-06-07 DIAGNOSIS — Z85828 Personal history of other malignant neoplasm of skin: Secondary | ICD-10-CM

## 2011-06-07 DIAGNOSIS — B961 Klebsiella pneumoniae [K. pneumoniae] as the cause of diseases classified elsewhere: Secondary | ICD-10-CM | POA: Diagnosis present

## 2011-06-07 DIAGNOSIS — R42 Dizziness and giddiness: Secondary | ICD-10-CM

## 2011-06-07 DIAGNOSIS — E039 Hypothyroidism, unspecified: Secondary | ICD-10-CM

## 2011-06-07 DIAGNOSIS — I10 Essential (primary) hypertension: Secondary | ICD-10-CM

## 2011-06-07 DIAGNOSIS — F0280 Dementia in other diseases classified elsewhere without behavioral disturbance: Secondary | ICD-10-CM | POA: Diagnosis present

## 2011-06-07 DIAGNOSIS — R4182 Altered mental status, unspecified: Secondary | ICD-10-CM

## 2011-06-07 DIAGNOSIS — K222 Esophageal obstruction: Secondary | ICD-10-CM

## 2011-06-07 DIAGNOSIS — Z860100 Personal history of colon polyps, unspecified: Secondary | ICD-10-CM

## 2011-06-07 DIAGNOSIS — Z7901 Long term (current) use of anticoagulants: Secondary | ICD-10-CM

## 2011-06-07 DIAGNOSIS — Z87898 Personal history of other specified conditions: Secondary | ICD-10-CM

## 2011-06-07 DIAGNOSIS — T887XXA Unspecified adverse effect of drug or medicament, initial encounter: Secondary | ICD-10-CM

## 2011-06-07 DIAGNOSIS — I951 Orthostatic hypotension: Secondary | ICD-10-CM

## 2011-06-07 DIAGNOSIS — J189 Pneumonia, unspecified organism: Secondary | ICD-10-CM

## 2011-06-07 DIAGNOSIS — N4889 Other specified disorders of penis: Secondary | ICD-10-CM | POA: Diagnosis not present

## 2011-06-07 DIAGNOSIS — Z79899 Other long term (current) drug therapy: Secondary | ICD-10-CM

## 2011-06-07 DIAGNOSIS — R1310 Dysphagia, unspecified: Secondary | ICD-10-CM

## 2011-06-07 DIAGNOSIS — G3183 Dementia with Lewy bodies: Secondary | ICD-10-CM | POA: Diagnosis present

## 2011-06-07 HISTORY — DX: Pneumonia, unspecified organism: J18.9

## 2011-06-07 HISTORY — DX: Shortness of breath: R06.02

## 2011-06-07 HISTORY — DX: Disease of blood and blood-forming organs, unspecified: D75.9

## 2011-06-07 LAB — CBC
HCT: 40.5 % (ref 39.0–52.0)
Hemoglobin: 13.9 g/dL (ref 13.0–17.0)
MCH: 32.1 pg (ref 26.0–34.0)
MCHC: 34.3 g/dL (ref 30.0–36.0)
MCV: 93.5 fL (ref 78.0–100.0)
Platelets: 139 10*3/uL — ABNORMAL LOW (ref 150–400)
RBC: 4.33 MIL/uL (ref 4.22–5.81)
RDW: 13.1 % (ref 11.5–15.5)
WBC: 4.9 10*3/uL (ref 4.0–10.5)

## 2011-06-07 LAB — COMPREHENSIVE METABOLIC PANEL
ALT: 10 U/L (ref 0–53)
Albumin: 3.8 g/dL (ref 3.5–5.2)
Alkaline Phosphatase: 62 U/L (ref 39–117)
BUN: 17 mg/dL (ref 6–23)
Chloride: 107 mEq/L (ref 96–112)
GFR calc Af Amer: 65 mL/min — ABNORMAL LOW (ref >90)
Glucose, Bld: 90 mg/dL (ref 70–99)
Potassium: 4.3 mEq/L (ref 3.5–5.1)
Sodium: 144 mEq/L (ref 135–145)
Total Bilirubin: 1.7 mg/dL — ABNORMAL HIGH (ref 0.3–1.2)
Total Protein: 6.4 g/dL (ref 6.0–8.3)

## 2011-06-07 LAB — DIFFERENTIAL
Basophils Absolute: 0 K/uL (ref 0.0–0.1)
Basophils Relative: 1 % (ref 0–1)
Eosinophils Absolute: 0.1 10*3/uL (ref 0.0–0.7)
Eosinophils Relative: 2 % (ref 0–5)
Lymphocytes Relative: 34 % (ref 12–46)
Lymphs Abs: 1.6 10*3/uL (ref 0.7–4.0)
Monocytes Absolute: 0.8 K/uL (ref 0.1–1.0)
Monocytes Relative: 17 % — ABNORMAL HIGH (ref 3–12)
Neutro Abs: 2.3 10*3/uL (ref 1.7–7.7)
Neutrophils Relative %: 48 % (ref 43–77)

## 2011-06-07 LAB — URINALYSIS, ROUTINE W REFLEX MICROSCOPIC
Bilirubin Urine: NEGATIVE
Glucose, UA: NEGATIVE mg/dL
Hgb urine dipstick: NEGATIVE
Ketones, ur: NEGATIVE mg/dL
Nitrite: NEGATIVE
Protein, ur: NEGATIVE mg/dL
Specific Gravity, Urine: 1.007 (ref 1.005–1.030)
Urobilinogen, UA: 0.2 mg/dL (ref 0.0–1.0)
pH: 5.5 (ref 5.0–8.0)

## 2011-06-07 LAB — URINE MICROSCOPIC-ADD ON

## 2011-06-07 LAB — PROTIME-INR
INR: 1.91 — ABNORMAL HIGH (ref 0.00–1.49)
Prothrombin Time: 22.2 s — ABNORMAL HIGH (ref 11.6–15.2)

## 2011-06-07 LAB — COMPREHENSIVE METABOLIC PANEL WITH GFR
AST: 20 U/L (ref 0–37)
CO2: 29 meq/L (ref 19–32)
Calcium: 9.3 mg/dL (ref 8.4–10.5)
Creatinine, Ser: 1.2 mg/dL (ref 0.50–1.35)
GFR calc non Af Amer: 56 mL/min — ABNORMAL LOW (ref >90)

## 2011-06-07 LAB — TROPONIN I: Troponin I: <0.3 ng/mL (ref <0.30)

## 2011-06-07 LAB — DIGOXIN LEVEL: Digoxin Level: 0.6 ng/mL — ABNORMAL LOW (ref 0.8–2.0)

## 2011-06-07 MED ORDER — SODIUM CHLORIDE 0.9 % IV BOLUS (SEPSIS): 500.0000 mL | Freq: Once | INTRAVENOUS | Status: DC

## 2011-06-07 NOTE — ED Notes (Signed)
pts son states he is confused. Has a hx of same whenever he has a UTI. Pt also has a hx of atrial fib. EKG requested.

## 2011-06-07 NOTE — ED Provider Notes (Signed)
History     CSN: [id number]  Arrival date & time 06/07/11  1623   First MD Initiated Contact with Patient 06/07/11 1717      Chief Complaint  Patient presents with  . Weakness  . Dizziness  . Altered Mental Status    (Consider location/radiation/quality/duration/timing/severity/associated sxs/prior treatment) HPI Comments: Level 5 caveat due to altered mentation.  Patient lives alone. Patient currently did have some nausea with near emesis 2 days ago. Patient's family did visit with the patient around lunchtime today and noticed that his speech was very slow to that he seemed more disoriented than usual. Patient admits that he is very tired and fatigued. However he denies a headache, sore throat, chest pain, back pain. He denies abdominal pain or back pain. He denies any current nausea. Family member reports that the patient acted similarly when he had a urinary tract infection. The patient admits that he has been urinating more frequently but denies pain. He denies any diarrhea or constipation. Patient's other family member then checked on him at about 3:00 today and noted that he seemed to be worse and that he seemed disoriented and also hallucinating. Family notes the patient has been known to have occasional hallucinations do to his medications but usually at nighttime. It is unusual for him to have such symptoms during the day. Patient has a history of atrial fibrillation for which she is on Lanoxin as well as Coumadin.  Patient is a 78 y.o. male presenting with weakness and altered mental status. The history is provided by the patient and a relative.  Weakness The primary symptoms include altered mental status.  Additional symptoms include weakness.  Altered Mental Status    Past Medical History  Diagnosis Date  . Parkinson's disease     Dr Avie Echevaria  . Hypertension   . Hypothyroidism     Caused by Amiodarone, resolved  . Orthostatic hypotension     Chronic  . Atrial  fibrillation   . Chronic anticoagulation   . BPH (benign prostatic hyperplasia)   . Cancer     hx of skin cancer;? basal cell  . Gastritis     Past Surgical History  Procedure Date  . Hernia repair      X 4 total  . Appendectomy Age 73  . Knee surgery Age 13  . US echocardiography July 2012    EF 55-60%  . Cardiovascular stress test 09-04-2003    EF 66%  . Middle ear surgery     Family History  Problem Relation Age of Onset  . Dementia Mother   . Heart disease Father     CHF  . Heart disease Paternal Grandfather     MI  . Cancer Brother     LUNG  . Heart disease Brother     BY-PASS    History  Substance Use Topics  . Smoking status: Never Smoker   . Smokeless tobacco: Not on file  . Alcohol Use: Yes     Socially 3-4 oz / week      Review of Systems  Unable to perform ROS: Mental status change  Neurological: Positive for weakness.  Psychiatric/Behavioral: Positive for altered mental status.    Allergies  Sulfonamide derivatives  Home Medications   Current Outpatient Rx  Name Route Sig Dispense Refill  . CARBIDOPA-LEVODOPA 25-100 MG PO TABS Oral Take 1 tablet by mouth 4 (four) times daily.    . CHLORPHENIRAMINE MALEATE 4 MG PO TABS Oral Take 4 mg  by mouth every evening.    Marland Kitchen CITALOPRAM HYDROBROMIDE 20 MG PO TABS Oral Take 20 mg by mouth daily.    Marland Kitchen DIGOXIN 0.25 MG PO TABS  TAKE 1/2 TABLET EVERY DAY 100 tablet 3  . FLUDROCORTISONE ACETATE 0.1 MG PO TABS Oral Take 0.1 mg by mouth daily.    Marland Kitchen ROPINIROLE HCL 1 MG PO TABS Oral Take 1-2 mg by mouth daily. 1 mg every morning and additional 1 mg at 8 pm on Monday, Wednesday, and Friday    . SELEGILINE HCL 5 MG PO TABS Oral Take 5 mg by mouth daily.      . WARFARIN SODIUM 5 MG PO TABS Oral Take 5-7.5 mg by mouth daily. 5 mg on Monday, Wednesday, and Friday; and 7.5 mg on Sunday, Tuesday, Thursday, and Saturday      BP 132/89  Pulse 72  Temp(Src) 96.9 F (36.1 C) (Rectal)  Resp 18  SpO2 95%  Physical Exam    Nursing note and vitals reviewed. Constitutional: He appears well-developed and well-nourished.  HENT:  Head: Normocephalic and atraumatic.  Eyes: Conjunctivae and EOM are normal. Pupils are equal, round, and reactive to light. No scleral icterus.  Neck: Neck supple. No tracheal deviation present.  Cardiovascular: Normal rate.  An irregular rhythm present.  No murmur heard. Pulmonary/Chest: Effort normal and breath sounds normal. No stridor.  Abdominal: Soft. Bowel sounds are normal.  Neurological: He is alert. No cranial nerve deficit or sensory deficit. He exhibits normal muscle tone. Coordination normal. GCS eye subscore is 4. GCS verbal subscore is 4. GCS motor subscore is 6.       No arm drift, 5/5 strength in 4 extremities, normal finger to nose  Skin: Skin is warm and dry. No rash noted.     Psychiatric: He has a normal mood and affect.    ED Course  Procedures (including critical care time)  Labs Reviewed  URINALYSIS, ROUTINE W REFLEX MICROSCOPIC - Abnormal; Notable for the following:    Leukocytes, UA SMALL (*)    All other components within normal limits  CBC - Abnormal; Notable for the following:    Platelets 139 (*)    All other components within normal limits  DIFFERENTIAL - Abnormal; Notable for the following:    Monocytes Relative 17 (*)    All other components within normal limits  COMPREHENSIVE METABOLIC PANEL - Abnormal; Notable for the following:    Total Bilirubin 1.7 (*)    GFR calc non Af Amer 56 (*)    GFR calc Af Amer 65 (*)    All other components within normal limits  PROTIME-INR - Abnormal; Notable for the following:    Prothrombin Time 22.2 (*)    INR 1.91 (*)    All other components within normal limits  DIGOXIN LEVEL - Abnormal; Notable for the following:    Digoxin Level 0.6 (*)    All other components within normal limits  URINE MICROSCOPIC-ADD ON  TROPONIN I  URINE CULTURE   Dg Chest 2 View  06/07/2011  *RADIOLOGY REPORT*  Clinical  Data: Altered mental status, weakness, history hypertension, Parkinson's, atrial fibrillation, skin cancer  CHEST - 2 VIEW  Comparison: 08/14/2010  Findings: Normal heart size, mediastinal contours, and pulmonary vascularity. Atherosclerotic calcification aortic arch. Probable bilateral nipple shadows, appear to be present on prior studies. No acute infiltrate, pleural effusion or pneumothorax. Underlying emphysematous changes. No acute osseous findings.  IMPRESSION: Emphysematous changes. No acute abnormalities.  Original Report Authenticated By: Loraine Leriche  A. Tyron Russell, M.D.   Ct Head Wo Contrast  06/07/2011  *RADIOLOGY REPORT*  Clinical Data: 78 year old male with altered mental status, weakness.  CT HEAD WITHOUT CONTRAST  Technique:  Contiguous axial images were obtained from the base of the skull through the vertex without contrast.  Comparison: 08/14/2010.  Findings: Visualized paranasal sinuses and mastoids are clear. Chronic left stapes implant. No acute osseous abnormality identified.  Visualized orbits and scalp soft tissues are within normal limits.  Stable cerebral volume.  No ventriculomegaly.  Mild Calcified atherosclerosis at the skull base.  No midline shift, mass effect, or evidence of mass lesion.  No evidence of cortically based acute infarction identified.  No acute intracranial hemorrhage identified.  Gray-white matter differentiation is within normal limits throughout the brain.  No suspicious intracranial vascular hyperdensity.  IMPRESSION: Stable and negative noncontrast CT appearance of the brain for age.  Original Report Authenticated By: Harley Hallmark, M.D.     1. Altered mental status   2. Atrial fibrillation      EKG at time 17:08 shows atrial fib at rate of 83, normal axis, RSR` noted in V2 no ST or T wave abn's.  No sig change compared to ECG from 7/77/2012.     7:27 PM Patient remained stable with no significant change in his current mentation. However family still affirms the  patient is changed from his baseline. His rectal temperature was slightly low at 96.9, however no obvious symptoms or signs of infection. His lab tests as well as films which I personally reviewed show no acute abnormalities. However due to his change in mentation as well as the fact that he lives by himself, family, patient and I agree that he should be admitted primarily for brain MRI as well as observation.  Will discuss with hospitalist at [facility].  UA shows small LE, 3-6 WBC's, not definite UTI, but will add culture to labs esp since pt has had similar symptoms with UTI in the past.    MDM  AMS, normotensive.  RA sat of 100% is normal.  Will give IVF's, UA shows no sig UTI.  Will get CXR and head CT.  May require admission for AMS and living alone.          Gavin Pound. Oletta Lamas, MD 06/07/11 1610

## 2011-06-07 NOTE — ED Notes (Signed)
When doing ortho stats. Patient stated that they felt dizzy and "top heavy" while standing. Nurse was notified.

## 2011-06-07 NOTE — ED Notes (Signed)
Pt's daughter reports pt was saying "strange things" earlier today around lunch time; pt c/o weakness

## 2011-06-07 NOTE — ED Notes (Signed)
Report to Tyrone Apple RN with [facility].

## 2011-06-07 NOTE — Progress Notes (Signed)
Called by primary RN to see pt for decreasing LOC.  On arrival pt in bed & son at [facility].  Pt's son explained that he picked him up from the AL facility and took him to lunch at [employer].  At that time he noticed the pt to be speaking & walking slower than normal & the pt stated he had hallucinations earlier in the morning (per son this is not uncommon).  As the afternoon progressed the pt became slower to process things & began to slur his speech and say inappropriate things.  At that time he was brought to [facility]. Last none normal can not be established.  On assessment the pt is very lethargic but will verbally respond to questioning.  Pt is able to follow simple commands and does not appear to have any focal deficits.  Pt has slight slurring of speech and is disoriented to time & location though he knows his age.  Pt has equal strength in all extremities.  Pt has some inappropriate words though it is difficult to tell if this is due to lethargy or some other cause.  Per son pt was similar to this last July when he was admitted for UTI but his s/s at that time were not as severe as they currently appear.  VSS, afebrile.  Will cont. To monitor as needed.

## 2011-06-08 ENCOUNTER — Inpatient Hospital Stay (HOSPITAL_COMMUNITY): Payer: Medicare Other

## 2011-06-08 ENCOUNTER — Encounter (HOSPITAL_COMMUNITY): Payer: Self-pay | Admitting: *Deleted

## 2011-06-08 DIAGNOSIS — R4182 Altered mental status, unspecified: Principal | ICD-10-CM | POA: Diagnosis present

## 2011-06-08 DIAGNOSIS — G20 Parkinson's disease: Secondary | ICD-10-CM

## 2011-06-08 DIAGNOSIS — I4891 Unspecified atrial fibrillation: Secondary | ICD-10-CM

## 2011-06-08 LAB — BLOOD GAS, ARTERIAL
Acid-Base Excess: 3 mmol/L — ABNORMAL HIGH (ref 0.0–2.0)
Bicarbonate: 26.9 mEq/L — ABNORMAL HIGH (ref 20.0–24.0)
O2 Saturation: 97.5 %
Patient temperature: 98.6
pO2, Arterial: 89.9 mmHg (ref 80.0–100.0)

## 2011-06-08 LAB — COMPREHENSIVE METABOLIC PANEL
BUN: 14 mg/dL (ref 6–23)
CO2: 29 mEq/L (ref 19–32)
Calcium: 8.8 mg/dL (ref 8.4–10.5)
Chloride: 108 mEq/L (ref 96–112)
Creatinine, Ser: 1.22 mg/dL (ref 0.50–1.35)
GFR calc Af Amer: 63 mL/min — ABNORMAL LOW (ref >90)
GFR calc non Af Amer: 55 mL/min — ABNORMAL LOW (ref >90)
Glucose, Bld: 93 mg/dL (ref 70–99)
Total Bilirubin: 2.3 mg/dL — ABNORMAL HIGH (ref 0.3–1.2)

## 2011-06-08 LAB — GLUCOSE, CAPILLARY: Glucose-Capillary: 84 mg/dL (ref 70–99)

## 2011-06-08 LAB — CBC
MCH: 31.7 pg (ref 26.0–34.0)
Platelets: 135 10*3/uL — ABNORMAL LOW (ref 150–400)
RBC: 4.42 MIL/uL (ref 4.22–5.81)
WBC: 5.2 10*3/uL (ref 4.0–10.5)

## 2011-06-08 LAB — CARDIAC PANEL(CRET KIN+CKTOT+MB+TROPI)
CK, MB: 2.7 ng/mL (ref 0.3–4.0)
CK, MB: 2.2 ng/mL (ref 0.3–4.0)
Relative Index: INVALID (ref 0.0–2.5)
Total CK: 79 U/L (ref 7–232)
Total CK: 64 U/L (ref 7–232)

## 2011-06-08 LAB — RAPID URINE DRUG SCREEN, HOSP PERFORMED
Amphetamines: NOT DETECTED
Barbiturates: NOT DETECTED
Benzodiazepines: NOT DETECTED
Cocaine: NOT DETECTED
Tetrahydrocannabinol: NOT DETECTED

## 2011-06-08 MED ORDER — WARFARIN SODIUM 5 MG PO TABS: 5.0000 mg | ORAL_TABLET | Freq: Every day | ORAL | Status: DC

## 2011-06-08 MED ORDER — DOCUSATE SODIUM 100 MG PO CAPS
100.0000 mg | ORAL_CAPSULE | Freq: Two times a day (BID) | ORAL | Status: DC
  Administered 2011-06-09 – 2011-06-12 (×5): 100 mg via ORAL
  Filled 2011-06-08 (×10): qty 1

## 2011-06-08 MED ORDER — SENNA 8.6 MG PO TABS
1.0000 | ORAL_TABLET | Freq: Two times a day (BID) | ORAL | Status: DC
  Administered 2011-06-09 – 2011-06-12 (×5): 8.6 mg via ORAL
  Filled 2011-06-08 (×10): qty 1

## 2011-06-08 MED ORDER — ONDANSETRON HCL 4 MG PO TABS: 4.0000 mg | ORAL_TABLET | Freq: Four times a day (QID) | ORAL | Status: DC | PRN

## 2011-06-08 MED ORDER — SODIUM CHLORIDE 0.9 % IJ SOLN
3.0000 mL | Freq: Two times a day (BID) | INTRAMUSCULAR | Status: DC
  Administered 2011-06-08 – 2011-06-11 (×4): 3 mL via INTRAVENOUS

## 2011-06-08 MED ORDER — WARFARIN SODIUM 10 MG PO TABS
10.0000 mg | ORAL_TABLET | Freq: Once | ORAL | Status: AC
  Administered 2011-06-08: 10 mg via ORAL
  Filled 2011-06-08: qty 1

## 2011-06-08 MED ORDER — ONDANSETRON HCL 4 MG/2ML IJ SOLN: 4.0000 mg | Freq: Four times a day (QID) | INTRAMUSCULAR | Status: DC | PRN

## 2011-06-08 MED ORDER — ROPINIROLE HCL 1 MG PO TABS
1.0000 mg | ORAL_TABLET | Freq: Every day | ORAL | Status: DC
  Administered 2011-06-08: 2 mg via ORAL
  Administered 2011-06-09: 1 mg via ORAL
  Administered 2011-06-10: 2 mg via ORAL
  Filled 2011-06-08 (×3): qty 2

## 2011-06-08 MED ORDER — SELEGILINE HCL 5 MG PO TABS
5.0000 mg | ORAL_TABLET | Freq: Every day | ORAL | Status: DC
  Administered 2011-06-08 – 2011-06-10 (×3): 5 mg via ORAL
  Filled 2011-06-08 (×3): qty 1

## 2011-06-08 MED ORDER — DIGOXIN 250 MCG PO TABS
0.2500 mg | ORAL_TABLET | Freq: Every day | ORAL | Status: DC
  Administered 2011-06-08 – 2011-06-12 (×5): 0.25 mg via ORAL
  Filled 2011-06-08 (×5): qty 1

## 2011-06-08 MED ORDER — SODIUM CHLORIDE 0.9 % IV SOLN
Freq: Once | INTRAVENOUS | Status: AC
  Administered 2011-06-08: 07:00:00 via INTRAVENOUS

## 2011-06-08 MED ORDER — FLUDROCORTISONE ACETATE 0.1 MG PO TABS
0.1000 mg | ORAL_TABLET | Freq: Every day | ORAL | Status: DC
  Administered 2011-06-08 – 2011-06-12 (×5): 0.1 mg via ORAL
  Filled 2011-06-08 (×5): qty 1

## 2011-06-08 MED ORDER — DEXTROSE 5 % IV SOLN
1.0000 g | INTRAVENOUS | Status: DC
  Administered 2011-06-08 – 2011-06-12 (×5): 1 g via INTRAVENOUS
  Filled 2011-06-08 (×5): qty 10

## 2011-06-08 MED ORDER — ACETAMINOPHEN 325 MG PO TABS: 650.0000 mg | ORAL_TABLET | Freq: Four times a day (QID) | ORAL | Status: DC | PRN

## 2011-06-08 MED ORDER — WARFARIN - PHARMACIST DOSING INPATIENT: Freq: Every day | Status: DC

## 2011-06-08 MED ORDER — ACETAMINOPHEN 650 MG RE SUPP: 650.0000 mg | Freq: Four times a day (QID) | RECTAL | Status: DC | PRN

## 2011-06-08 MED ORDER — CARBIDOPA-LEVODOPA 25-100 MG PO TABS
1.0000 | ORAL_TABLET | Freq: Four times a day (QID) | ORAL | Status: DC
  Administered 2011-06-08 – 2011-06-10 (×9): 1 via ORAL
  Filled 2011-06-08 (×12): qty 1

## 2011-06-08 NOTE — Progress Notes (Signed)
Clinical Social Work Department BRIEF PSYCHOSOCIAL ASSESSMENT 06/08/2011  Patient:  Carlos Gonzalez, Carlos Gonzalez     Account Number:  [id number]     Admit date:  06/07/2011  Clinical Social Worker:  Dennison Bulla  Date/Time:  06/08/2011 09:30 AM  Referred by:  Physician  Date Referred:  06/08/2011 Referred for  ALF Placement   Other Referral:   Interview type:  Patient Other interview type:    PSYCHOSOCIAL DATA Living Status:  FACILITY Admitted from facility:  [facility] Level of care:  Independent Living Primary support name:  [name] Primary support relationship to patient:  CHILD, ADULT Degree of support available:   Adequate    CURRENT CONCERNS Current Concerns  Post-Acute Placement   Other Concerns:    SOCIAL WORK ASSESSMENT / PLAN CSW received referral due to patient being admitted from a facility. CSW met with patient at bedside. Patient reported he was confused of why he came into the hospital and wanted to speak with his son. RN was on the phone with son who spoke with patient. Patient reported he lives at [facility] and wishes to return at dc. CSW reviewed chart which stated PT had no further recommendations. CSW spoke with Fabio Bering who stated patient lives in independent living. CSW is signing off but available if needed.   Assessment/plan status:  No Further Intervention Required Other assessment/ plan:   Information/referral to community resources:   Will return to ind. living    PATIENT'S/FAMILY'S RESPONSE TO PLAN OF CARE: Patient was alert and oriented. Patient was able to report he lives at [facility] and will return at dc. Patient appreciative of CSW consult and will speak with family about dc plans.

## 2011-06-08 NOTE — Progress Notes (Signed)
Nutrition Brief Note  RD pulled to patient regarding problems chewing or swallowing foods and/or liquids per admission nutrition screen. Patient reports no current difficulty chewing or swallowing. Reports a good appetite with recent weight stability. BMI = 22.9 kg/m2 (Normal). No nutrition intervention warranted at this time. Please consult RD as needed.  Alger Memos, RD, LDN Pager #: [phone]

## 2011-06-08 NOTE — Progress Notes (Addendum)
Subjective: Patient states he does not remember lunch and after yesterday- No c/o of pain No fevers No dysuria but does have incontinence   Objective: Vital signs in last 24 hours: Filed Vitals:   06/08/11 0001 06/08/11 0200 06/08/11 0400 06/08/11 0600  BP: 149/69 150/79 155/80 147/88  Pulse: 69 67 78 62  Temp: 98.4 F (36.9 C) 98.5 F (36.9 C) 98.7 F (37.1 C) 98.5 F (36.9 C)  TempSrc:      Resp: 20 20 16 16   Height:      Weight:      SpO2: 99% 95% 97% 65%   Weight change:   Intake/Output Summary (Last 24 hours) at 06/08/11 0833 Last data filed at 06/07/11 1845  Gross per 24 hour  Intake    500 ml  Output      0 ml  Net    500 ml    Physical Exam: General: Awake, Oriented, cooperative, No acute distress. HEENT: EOMI. Neck: Supple CV: S1 and S2, irr Lungs: Clear to ascultation bilaterally, no wheezing Abdomen: Soft, Nontender, Nondistended, +bowel sounds. Ext: Good pulses. Trace edema.   Lab Results:  [facility] 06/08/11 0520 06/07/11 1700  NA 144 144  K 4.1 4.3  CL 108 107  CO2 29 29  GLUCOSE 93 90  BUN 14 17  CREATININE 1.22 1.20  CALCIUM 8.8 9.3  MG -- --  PHOS -- --    Basename 06/08/11 0520 06/07/11 1700  AST 15 20  ALT 11 10  ALKPHOS 55 62  BILITOT 2.3* 1.7*  PROT 5.8* 6.4  ALBUMIN 3.5 3.8   No results found for this basename: LIPASE:2,AMYLASE:2 in the last 72 hours  Basename 06/08/11 0520 06/07/11 1700  WBC 5.2 4.9  NEUTROABS -- 2.3  HGB 14.0 13.9  HCT 41.5 40.5  MCV 93.9 93.5  PLT 135* 139*    Basename 06/08/11 0520 06/07/11 1700  CKTOTAL 79 --  CKMB 2.7 --  CKMBINDEX -- --  TROPONINI <0.30 <0.30   No components found with this basename: POCBNP:3 No results found for this basename: DDIMER:2 in the last 72 hours No results found for this basename: HGBA1C:2 in the last 72 hours No results found for this basename: CHOL:2,HDL:2,LDLCALC:2,TRIG:2,CHOLHDL:2,LDLDIRECT:2 in the last 72 hours No results found for this basename:  TSH,T4TOTAL,FREET3,T3FREE,THYROIDAB in the last 72 hours No results found for this basename: VITAMINB12:2,FOLATE:2,FERRITIN:2,TIBC:2,IRON:2,RETICCTPCT:2 in the last 72 hours  Micro Results: No results found for this or any previous visit (from the past 240 hour(s)).  Studies/Results: Dg Chest 2 View  06/07/2011  *RADIOLOGY REPORT*  Clinical Data: Altered mental status, weakness, history hypertension, Parkinson's, atrial fibrillation, skin cancer  CHEST - 2 VIEW  Comparison: 08/14/2010  Findings: Normal heart size, mediastinal contours, and pulmonary vascularity. Atherosclerotic calcification aortic arch. Probable bilateral nipple shadows, appear to be present on prior studies. No acute infiltrate, pleural effusion or pneumothorax. Underlying emphysematous changes. No acute osseous findings.  IMPRESSION: Emphysematous changes. No acute abnormalities.  Original Report Authenticated By: Lollie Marrow, M.D.   Ct Head Wo Contrast  06/07/2011  *RADIOLOGY REPORT*  Clinical Data: 78 year old male with altered mental status, weakness.  CT HEAD WITHOUT CONTRAST  Technique:  Contiguous axial images were obtained from the base of the skull through the vertex without contrast.  Comparison: 08/14/2010.  Findings: Visualized paranasal sinuses and mastoids are clear. Chronic left stapes implant. No acute osseous abnormality identified.  Visualized orbits and scalp soft tissues are within normal limits.  Stable cerebral volume.  No ventriculomegaly.  Mild Calcified atherosclerosis at the skull base.  No midline shift, mass effect, or evidence of mass lesion.  No evidence of cortically based acute infarction identified.  No acute intracranial hemorrhage identified.  Gray-white matter differentiation is within normal limits throughout the brain.  No suspicious intracranial vascular hyperdensity.  IMPRESSION: Stable and negative noncontrast CT appearance of the brain for age.  Original Report Authenticated By: Harley Hallmark, M.D.    Medications: I have reviewed the patient's current medications. Scheduled Meds:   . sodium chloride   Intravenous Once  . digoxin  0.25 mg Oral Daily  . docusate sodium  100 mg Oral BID  . fludrocortisone  0.1 mg Oral Daily  . senna  1 tablet Oral BID  . sodium chloride  3 mL Intravenous Q12H  . warfarin  10 mg Oral ONCE-1800  . Warfarin - Pharmacist Dosing Inpatient   Does not apply q1800  . DISCONTD: sodium chloride  500 mL Intravenous Once  . DISCONTD: warfarin  5-7.5 mg Oral Daily   Continuous Infusions:  PRN Meds:.acetaminophen, acetaminophen, ondansetron (ZOFRAN) IV, ondansetron  Assessment/Plan: 1. Altered mental status: Overall this seems like delirium on mild Parkinson's, however per report  the Parkinson's is mild and usually his mental status is appropriate. W/u so far is unrevealing await MRI.   Although family reports he's been this way with UTI previously, his UA is very unimpressive, UCx pending. Medication effect (sinemet, celexa, chlorpheniramine/anticholinergic, requip all listed on med list) vs non-focal stroke also considered. Patient denies any new medications- will hold medications for now. Per old office notes there have been issues with his medication doses- ? Taking too much? 2. AFib: Continue home coumadin and digoxin, trend daily INR  3. Elevated Tbili: Pt with Gilbert's noted on problem list.  4. Continue home fludocortisone 5. ? UTI- await culture- start rocephin PT to eval patient  STOP CELEXA as contraindicated with selegeline   No family at bedside currently- will check back later   LOS: 1 day  Mollye Guinta, DO 06/08/2011, 8:33 AM

## 2011-06-08 NOTE — H&P (Signed)
PCP:  Marga Melnick, MD, MD   Cannot confirm  Chief Complaint:  Altered mental status  HPI: 78yoM with h/o Parkinson's disease, AFib/coumadin, amiodarone induced hypothyroidism  transferred from [facility] for altered mental status.   Pt cannot give history, he appears quite delirious at present and son who was previously here is  now gone. So, history gathered from ED notes and from nursing staff. Apprently, pt lives  independently at an ALF and despite some hallucinations associated with Parkinson's, is still high  functioning, ambulatory, able to manage his own meds. Review of some EPIC notes shows he is an  "excellent historian." Around lunch today his family noted his speech was very slow, was more  disoriented. He's acted this way previously with UTI's. He had some nausea and emesis 2d ago  and endorsed some fatigue.   Vitals so far stable except temp 96.9, now normal, and mild HTN. Labs with normal chem panel,  renal 17/1.2, normal LFT's except Tbili 1.7. Trop negative. Normal CBC. INR 1.9. UA negative. CT  head stable and negative for acute process. CXR with emphysema but nothing acute. ECG not  ischemic. Digoxin 0.6.   At present, pt can state he's having no chest pain, SOB, abd pain. He wants to get up to pee, but is  repeatedly told he has a condom cath on.   Past Medical History  Diagnosis Date  . Parkinson's disease     Dr Avie Echevaria  . Hypertension   . Hypothyroidism     Caused by Amiodarone, resolved  . Orthostatic hypotension     Chronic  . Atrial fibrillation   . Chronic anticoagulation   . BPH (benign prostatic hyperplasia)   . Cancer     hx of skin cancer;? basal cell  . Gastritis     Past Surgical History  Procedure Date  . Hernia repair      X 4 total  . Appendectomy Age 78  . Knee surgery Age 78  . US echocardiography July 2012    EF 55-60%  . Cardiovascular stress test 09-04-2003    EF 66%  . Middle ear surgery     Medications:  HOME MEDS:  Not able to be reconciled by me.  Prior to Admission medications   Medication Sig Start Date End Date Taking? Authorizing Provider  carbidopa-levodopa (SINEMET IR) 25-100 MG per tablet Take 1 tablet by mouth 4 (four) times daily.   Yes Historical Provider, MD  chlorpheniramine (CHLOR-TRIMETON) 4 MG tablet Take 4 mg by mouth every evening.   Yes Historical Provider, MD  citalopram (CELEXA) 20 MG tablet Take 20 mg by mouth daily.   Yes Historical Provider, MD  digoxin (LANOXIN) 0.25 MG tablet TAKE 1/2 TABLET EVERY DAY 10/31/10  Yes Cassell Clement, MD  fludrocortisone (FLORINEF) 0.1 MG tablet Take 0.1 mg by mouth daily.   Yes Historical Provider, MD  rOPINIRole (REQUIP) 1 MG tablet Take 1-2 mg by mouth daily. 1 mg every morning and additional 1 mg at 8 pm on Monday, Wednesday, and Friday   Yes Historical Provider, MD  selegiline (ELDEPRYL) 5 MG tablet Take 5 mg by mouth daily.     Yes Historical Provider, MD  warfarin (COUMADIN) 5 MG tablet Take 5-7.5 mg by mouth daily. 5 mg on Monday, Wednesday, and Friday; and 7.5 mg on Sunday, Tuesday, Thursday, and Saturday   Yes Historical Provider, MD    Allergies:  Allergies  Allergen Reactions  . Sulfonamide Derivatives Nausea Only  1957 post knee surgery    Social History:   reports that he has never smoked. He does not have any smokeless tobacco history on file. He reports that he drinks alcohol. He reports that he does not use illicit drugs.  Family History: Family History  Problem Relation Age of Onset  . Dementia Mother   . Heart disease Father     CHF  . Heart disease Paternal Grandfather     MI  . Cancer Brother     LUNG  . Heart disease Brother     BY-PASS    Physical Exam: Filed Vitals:   06/07/11 1915 06/07/11 1919 06/07/11 2200 06/08/11 0001  BP: 154/85 132/89 141/70 149/69  Pulse: 85 72 70 69  Temp:   98.4 F (36.9 C) 98.4 F (36.9 C)  TempSrc:      Resp:   21 20  Height:   6' (1.829 m)   Weight:   76.4 kg (168 lb  6.9 oz)   SpO2:   98% 99%   Blood pressure 149/69, pulse 69, temperature 98.4 F (36.9 C), temperature source Rectal, resp. rate 20, height 6' (1.829 m), weight 76.4 kg (168 lb 6.9 oz), SpO2 99.00%. Gen: Elderly, tall, moderately frail appearing M in hospital bed. Overall, appears delirious, keeps  wanting to get up to use bathroom but is repeatedly told he has condom cath on. Is fidgetying  with the lines and tethers around him. Is breathing comfortably, no increased WOB. Can answer  questions, but not give coherent conversation HEENT: Pupils round and equal, EOMI without nystagmus noted, sclera clear. Mouth is pretty  dry appearing, and tongue midline, moves well.  Lungs: Anterolaterally, can't sit forward easily. Grossly clear without adventitious sounds but  hard exam Heart: S1/2 appreciated, irregular, not tachy, but no gross m/g, not great exam either Abd: Soft, not tender, not distended, no facial grimacing, no TTP Extrem: Thin, decreased muscle bulk, some increased tonicity but hard to assess, no BLE edema  noted. Warm hands/feet.  Neuro: Oriented to Jun 07, 2012 but is obviously looking at the calendar on the wall. Says he lives  at [facility], and has two children that are 53 and 78 yrs old (this is apparently wrong), and can  state his wife's name (not sure if correct), and that he's from [facility]. Alert, inattentive and requires a  lot of redirection. CN 2-12 intact with apparent facial symmetry, clear speech that seems mostly  fluent, no drooping or slurring speech. Is moving his extremities on his own and trying to get out  of bed, has good strength in his BUE's and BLE's for proximal and distal muscle groups against  resistance. Finger nose finger testing is good iwthout past pointing, some minimal tremor. Tremor  also noted in his bilateral hands, seemingly at rest maybe worse with intention. Hard to elicit any  cogwheeling.    Labs & Imaging Results for orders placed during the  hospital encounter of 06/07/11 (from the past 48 hour(s))  CBC     Status: Abnormal   Collection Time   06/07/11  5:00 PM      Component Value Range Comment   WBC 4.9  4.0 - 10.5 (K/uL)    RBC 4.33  4.22 - 5.81 (MIL/uL)    Hemoglobin 13.9  13.0 - 17.0 (g/dL)    HCT 78.2  95.6 - 21.3 (%)    MCV 93.5  78.0 - 100.0 (fL)    MCH 32.1  26.0 - 34.0 (  pg)    MCHC 34.3  30.0 - 36.0 (g/dL)    RDW 40.9  81.1 - 91.4 (%)    Platelets 139 (*) 150 - 400 (K/uL)   DIFFERENTIAL     Status: Abnormal   Collection Time   06/07/11  5:00 PM      Component Value Range Comment   Neutrophils Relative 48  43 - 77 (%)    Neutro Abs 2.3  1.7 - 7.7 (K/uL)    Lymphocytes Relative 34  12 - 46 (%)    Lymphs Abs 1.6  0.7 - 4.0 (K/uL)    Monocytes Relative 17 (*) 3 - 12 (%)    Monocytes Absolute 0.8  0.1 - 1.0 (K/uL)    Eosinophils Relative 2  0 - 5 (%)    Eosinophils Absolute 0.1  0.0 - 0.7 (K/uL)    Basophils Relative 1  0 - 1 (%)    Basophils Absolute 0.0  0.0 - 0.1 (K/uL)   COMPREHENSIVE METABOLIC PANEL     Status: Abnormal   Collection Time   06/07/11  5:00 PM      Component Value Range Comment   Sodium 144  135 - 145 (mEq/L)    Potassium 4.3  3.5 - 5.1 (mEq/L)    Chloride 107  96 - 112 (mEq/L)    CO2 29  19 - 32 (mEq/L)    Glucose, Bld 90  70 - 99 (mg/dL)    BUN 17  6 - 23 (mg/dL)    Creatinine, Ser 7.82  0.50 - 1.35 (mg/dL)    Calcium 9.3  8.4 - 10.5 (mg/dL)    Total Protein 6.4  6.0 - 8.3 (g/dL)    Albumin 3.8  3.5 - 5.2 (g/dL)    AST 20  0 - 37 (U/L)    ALT 10  0 - 53 (U/L)    Alkaline Phosphatase 62  39 - 117 (U/L)    Total Bilirubin 1.7 (*) 0.3 - 1.2 (mg/dL)    GFR calc non Af Amer 56 (*) >90 (mL/min)    GFR calc Af Amer 65 (*) >90 (mL/min)   TROPONIN I     Status: Normal   Collection Time   06/07/11  5:00 PM      Component Value Range Comment   Troponin I <0.30  <0.30 (ng/mL)   PROTIME-INR     Status: Abnormal   Collection Time   06/07/11  5:00 PM      Component Value Range Comment    Prothrombin Time 22.2 (*) 11.6 - 15.2 (seconds)    INR 1.91 (*) 0.00 - 1.49    DIGOXIN LEVEL     Status: Abnormal   Collection Time   06/07/11  5:00 PM      Component Value Range Comment   Digoxin Level 0.6 (*) 0.8 - 2.0 (ng/mL)   URINALYSIS, ROUTINE W REFLEX MICROSCOPIC     Status: Abnormal   Collection Time   06/07/11  5:01 PM      Component Value Range Comment   Color, Urine YELLOW  YELLOW     APPearance CLEAR  CLEAR     Specific Gravity, Urine 1.007  1.005 - 1.030     pH 5.5  5.0 - 8.0     Glucose, UA NEGATIVE  NEGATIVE (mg/dL)    Hgb urine dipstick NEGATIVE  NEGATIVE     Bilirubin Urine NEGATIVE  NEGATIVE     Ketones, ur NEGATIVE  NEGATIVE (mg/dL)  Protein, ur NEGATIVE  NEGATIVE (mg/dL)    Urobilinogen, UA 0.2  0.0 - 1.0 (mg/dL)    Nitrite NEGATIVE  NEGATIVE     Leukocytes, UA SMALL (*) NEGATIVE    URINE MICROSCOPIC-ADD ON     Status: Normal   Collection Time   06/07/11  5:01 PM      Component Value Range Comment   Squamous Epithelial / LPF RARE  RARE     WBC, UA 3-6  <3 (WBC/hpf)    Bacteria, UA RARE  RARE     Dg Chest 2 View  06/07/2011  *RADIOLOGY REPORT*  Clinical Data: Altered mental status, weakness, history hypertension, Parkinson's, atrial fibrillation, skin cancer  CHEST - 2 VIEW  Comparison: 08/14/2010  Findings: Normal heart size, mediastinal contours, and pulmonary vascularity. Atherosclerotic calcification aortic arch. Probable bilateral nipple shadows, appear to be present on prior studies. No acute infiltrate, pleural effusion or pneumothorax. Underlying emphysematous changes. No acute osseous findings.  IMPRESSION: Emphysematous changes. No acute abnormalities.  Original Report Authenticated By: Lollie Marrow, M.D.   Ct Head Wo Contrast  06/07/2011  *RADIOLOGY REPORT*  Clinical Data: 78 year old male with altered mental status, weakness.  CT HEAD WITHOUT CONTRAST  Technique:  Contiguous axial images were obtained from the base of the skull through the vertex  without contrast.  Comparison: 08/14/2010.  Findings: Visualized paranasal sinuses and mastoids are clear. Chronic left stapes implant. No acute osseous abnormality identified.  Visualized orbits and scalp soft tissues are within normal limits.  Stable cerebral volume.  No ventriculomegaly.  Mild Calcified atherosclerosis at the skull base.  No midline shift, mass effect, or evidence of mass lesion.  No evidence of cortically based acute infarction identified.  No acute intracranial hemorrhage identified.  Gray-white matter differentiation is within normal limits throughout the brain.  No suspicious intracranial vascular hyperdensity.  IMPRESSION: Stable and negative noncontrast CT appearance of the brain for age.  Original Report Authenticated By: Ulla Potash III, M.D.    ECG: AFib 83 bpm, normal axis, narrow QRS, no frank ST deviations, some TWF V1-2 and high  lateral, otherwise TW normal.    Impression Present on Admission:  .Altered mental status .Atrial fibrillation  81yoM with h/o Parkinson's disease, AFib/coumadin, amiodarone induced hypothyroidism  transferred from [facility] for altered mental status.   1. Altered mental status: Overall this seems like delirium on mild Parkinson's, however per report  the Parkinson's is mild and usually his mental status is appropriate. W/u so far is unrevealing and  frankly I'm not sure why he's altered. Although family reports he's been this way with UTI  previously, his UA is very unimpressive, UCx pending. Medication effect (sinemet, celexa,  chlorpheniramine/anticholinergic, requip all listed on med list) vs non-focal stroke also  considered.   - ABG, Urine tox, lactate, procalcitonin, ammonia, MRI brain in the am. Trend cardiac enzymes  - Holding sinemet, chlorpheniramine, celexa, ropinirole, seleginiline for now   2. AFib: Continue home coumadin and digoxin, trend daily INR 3. Elevated Tbili: Pt with Gilbert's noted on problem list.  4. Continue  home fludocortisone  Sub Q heparin Telemetry, MC team 6 Full code, not clear this status, should be clarified  Other plans as per orders.   Iren Whipp 06/08/2011, 1:49 AM   120a:  I called and left a message for the son that I had evaluated the pt and that he was stable, although delirious.

## 2011-06-08 NOTE — Progress Notes (Signed)
PHARMACY - ANTICOAGULATION CONSULT Initial Consult Note  Pharmacy Consult for: Coumadin  Indication: atrial fibrillation    Patient Data:   Allergies: Allergies  Allergen Reactions  . Sulfonamide Derivatives Nausea Only    1955 post knee surgery    Patient Measurements: Height: 6' (182.9 cm) Weight: 168 lb 6.9 oz (76.4 kg) IBW/kg (Calculated) : 77.6    Vital Signs: Temp:  [96.9 F (36.1 C)-98.4 F (36.9 C)] 98.4 F (36.9 C) (05/01 0001) Pulse Rate:  [69-85] 69  (05/01 0001) Resp:  [18-21] 20  (05/01 0001) BP: (119-157)/(69-90) 149/69 mmHg (05/01 0001) SpO2:  [95 %-100 %] 99 % (05/01 0001) Weight:  [168 lb 6.9 oz (76.4 kg)] 168 lb 6.9 oz (76.4 kg) (04/30 2200)  Intake/Output from previous day:  Intake/Output Summary (Last 24 hours) at 06/08/11 0158 Last data filed at 06/07/11 1845  Gross per 24 hour  Intake    500 ml  Output      0 ml  Net    500 ml    Labs:  Basename 06/07/11 1700  HGB 13.9  HCT 40.5  PLT 139*  APTT --  LABPROT 22.2*  INR 1.91*  HEPARINUNFRC --  CREATININE 1.20  CKTOTAL --  CKMB --  TROPONINI <0.30   Estimated Creatinine Clearance: 53.9 ml/min (by C-G formula based on Cr of 1.2).  Medical History: Past Medical History  Diagnosis Date  . Parkinson's disease     Dr Avie Echevaria  . Hypertension   . Hypothyroidism     Caused by Amiodarone, resolved  . Orthostatic hypotension     Chronic  . Atrial fibrillation   . Chronic anticoagulation   . BPH (benign prostatic hyperplasia)   . Cancer     hx of skin cancer;? basal cell  . Gastritis     Scheduled medications:     . sodium chloride   Intravenous Once  . digoxin  0.25 mg Oral Daily  . docusate sodium  100 mg Oral BID  . fludrocortisone  0.1 mg Oral Daily  . senna  1 tablet Oral BID  . sodium chloride  3 mL Intravenous Q12H  . DISCONTD: sodium chloride  500 mL Intravenous Once  . DISCONTD: warfarin  5-7.5 mg Oral Daily     Assessment:  78 y.o. male admitted on  06/07/2011, with AMS. Pharmacy consulted to manage Coumadin for h/o atrial fibrillation. INR (1.91) is below-goal. Home Coumadin regimen of 7.5mg  daily, except 5mg  on Monday, Wednesday, and Fridays.   Goal of Therapy:  1. INR 2-3  Plan:  1. Coumadin 10 mg po today. 2. Daily PT / INR.   Dineen Kid Thad Ranger, PharmD 06/08/2011, 1:58 AM

## 2011-06-08 NOTE — Evaluation (Signed)
Physical Therapy Evaluation Patient Details Name: Carlos Gonzalez MRN: [id number] DOB: 1930-12-06 Today's Date: 06/08/2011 Time: 7829-5621 PT Time Calculation (min): 27 min  PT Assessment / Plan / Recommendation Clinical Impression  Patient presented to ED with AMS, disorientated and delusional. He has now shown improvements in his mentation, but he is having issues with his memory and problme solving. I would recommend PT to ensure patient reaches a maximal level of indep. mobilty to decrease his fall risk at discharge.     PT Assessment  Patient needs continued PT services    Follow Up Recommendations   None. Recommend SLP consult for cognition    Equipment Recommendations  Toilet rise with handles    Frequency Min 3X/week    Precautions / Restrictions Precautions Precautions: Fall Restrictions Weight Bearing Restrictions: No   Pertinent Vitals/Pain N/A      Mobility  Bed Mobility Bed Mobility: Rolling Left;Left Sidelying to Sit;Sit to Supine;Sitting - Scoot to Edge of Bed Rolling Left: 7: Independent Left Sidelying to Sit: 7: Independent Sitting - Scoot to Edge of Bed: 7: Independent Sit to Supine: 7: Independent Transfers Transfers: Sit to Stand;Stand to Sit Sit to Stand: 4: Min guard;With upper extremity assist;From bed;From toilet Stand to Sit: 4: Min guard;With upper extremity assist;To bed;To toilet Details for Transfer Assistance: Difficulty controlling descent to toilet (later 25%)  - used grab bar.  Ambulation/Gait Ambulation/Gait Assistance: 4: Min guard Ambulation Distance (Feet): 300 Feet Assistive device: None Ambulation/Gait Assistance Details: Tendency for knee flexion bil in stance and with terminal swing phase of left lower extrmity resulting in decr. step length left and foot flat. Shuffling type gait initially Gait Pattern: Decreased hip/knee flexion - right;Decreased hip/knee flexion - left;Step-through pattern;Decreased stride length;Trunk  flexed;Left foot flat         PT Goals Acute Rehab PT Goals PT Goal Formulation: With patient Time For Goal Achievement: 06/15/11 Potential to Achieve Goals: Good Pt will go Sit to Stand: with modified independence;with upper extremity assist PT Goal: Sit to Stand - Progress: Goal set today Pt will go Stand to Sit: with modified independence;with upper extremity assist PT Goal: Stand to Sit - Progress: Goal set today Pt will Ambulate: >150 feet;with modified independence;with gait velocity >(comment) ft/second (at least greater than 1.8 feet/sec. to show decr. falls risk) PT Goal: Ambulate - Progress: Goal set today Additional Goals Additional Goal #1: Patient will demonstrate with timed up and go test less than 13.5 seconds to indicate decr risk of falls PT Goal: Additional Goal #1 - Progress: Goal set today  Visit Information  Last PT Received On: 06/08/11 Assistance Needed: +1    Subjective Data  Subjective: Patient reports no idea of how or why he got or is here Patient Stated Goal: Improve thinking   Prior Functioning  Home Living Available Help at Discharge: Available PRN/intermittently;Family Type of Home: Assisted living Home Layout: One level Bathroom Shower/Tub: Health visitor: Standard Home Adaptive Equipment: Grab bars around toilet;Grab bars in shower Additional Comments: does report some difficulty on and off toilet and is requesting a toilet riser. Prior Function Level of Independence: Needs assistance Needs Assistance: Light Housekeeping;Meal Prep Meal Prep: Total (has all meals provided. can rewarm in microwave) Light Housekeeping: Total (including laundry) Comments: Particpates in exercise and activity at the facility - not regularly    Cognition  Overall Cognitive Status: Impaired Area of Impairment: Memory Arousal/Alertness: Awake/alert Orientation Level: Disoriented to;Situation Behavior During Session: [facility] for tasks  performed Memory Deficits:  Recognized MD in halls that is his cardiologist adn church friend but could not recall his name Cognition - Other Comments: Difficulty comprehending how to use his call bell to change TV channel    Extremity/Trunk Assessment Right Lower Extremity Assessment RLE ROM/Strength/Tone: [facility] for tasks assessed RLE Sensation: WFL - Light Touch;WFL - Proprioception RLE Coordination: WFL - gross/fine motor Left Lower Extremity Assessment LLE ROM/Strength/Tone: WFL for tasks assessed LLE Sensation: WFL - Light Touch;WFL - Proprioception LLE Coordination: WFL - gross/fine motor Trunk Assessment Trunk Assessment: Kyphotic   Balance Balance Balance Assessed: Yes High Level Balance High Level Balance Activites: Turns;Side stepping High Level Balance Comments: 180 degree turns with 8 steps - indicative of incr. falls risk  End of Session PT - End of Session Equipment Utilized During Treatment: Gait belt Activity Tolerance: Patient tolerated treatment well Patient left: in bed;with call bell/phone within reach;with bed alarm set Nurse Communication: Mobility status   Edwyna Perfect, PT  Pager [phone]  06/08/2011, 9:05 AM

## 2011-06-09 ENCOUNTER — Inpatient Hospital Stay (HOSPITAL_COMMUNITY): Payer: Medicare Other

## 2011-06-09 DIAGNOSIS — R4182 Altered mental status, unspecified: Secondary | ICD-10-CM

## 2011-06-09 DIAGNOSIS — I4891 Unspecified atrial fibrillation: Secondary | ICD-10-CM

## 2011-06-09 DIAGNOSIS — G20 Parkinson's disease: Secondary | ICD-10-CM

## 2011-06-09 LAB — URINE CULTURE
Colony Count: 10000
Culture  Setup Time: 201305010619

## 2011-06-09 LAB — GLUCOSE, CAPILLARY
Glucose-Capillary: 79 mg/dL (ref 70–99)
Glucose-Capillary: 98 mg/dL (ref 70–99)

## 2011-06-09 MED ORDER — LORAZEPAM 2 MG/ML IJ SOLN
0.5000 mg | Freq: Once | INTRAMUSCULAR | Status: AC
  Administered 2011-06-09: 0.5 mg via INTRAVENOUS
  Filled 2011-06-09: qty 1

## 2011-06-09 MED ORDER — WARFARIN SODIUM 10 MG PO TABS
10.0000 mg | ORAL_TABLET | Freq: Once | ORAL | Status: AC
  Administered 2011-06-09: 10 mg via ORAL
  Filled 2011-06-09: qty 1

## 2011-06-09 NOTE — Progress Notes (Signed)
ANTICOAGULATION CONSULT NOTE - Follow Up Consult  Pharmacy Consult for Coumadin Indication: atrial fibrillation  Allergies  Allergen Reactions  . Sulfonamide Derivatives Nausea Only    1955 post knee surgery    Patient Measurements: Height: 6' (182.9 cm) Weight: 168 lb 6.9 oz (76.4 kg) IBW/kg (Calculated) : 77.6   Vital Signs: Temp: 97.5 F (36.4 C) (05/02 1200) Temp src: Oral (05/02 1200) BP: 118/73 mmHg (05/02 1200) Pulse Rate: 78  (05/02 1200)  Labs:  Basename 06/09/11 0520 06/08/11 2116 06/08/11 1228 06/08/11 0520 06/07/11 1700  HGB -- -- -- 14.0 13.9  HCT -- -- -- 41.5 40.5  PLT -- -- -- 135* 139*  APTT -- -- -- -- --  LABPROT 20.2* -- -- -- 22.2*  INR 1.69* -- -- -- 1.91*  HEPARINUNFRC -- -- -- -- --  CREATININE -- -- -- 1.22 1.20  CKTOTAL -- 64 84 79 --  CKMB -- 2.2 2.6 2.7 --  TROPONINI -- <0.30 <0.30 <0.30 --   Estimated Creatinine Clearance: 53.1 ml/min (by C-G formula based on Cr of 1.22).   Medications:  Scheduled:    . carbidopa-levodopa  1 tablet Oral QID  . cefTRIAXone (ROCEPHIN)  IV  1 g Intravenous Q24H  . digoxin  0.25 mg Oral Daily  . docusate sodium  100 mg Oral BID  . fludrocortisone  0.1 mg Oral Daily  . rOPINIRole  1-2 mg Oral Daily  . selegiline  5 mg Oral Daily  . senna  1 tablet Oral BID  . sodium chloride  3 mL Intravenous Q12H  . warfarin  10 mg Oral ONCE-1800  . Warfarin - Pharmacist Dosing Inpatient   Does not apply q1800    Assessment: 78yo male with AFib.  INR down this AM to 1.69, reflecting missed dose on 4/30.  No bleeding issues noted.    Goal of Therapy:  INR 2-3   Plan:  1.  Coumadin 10mg  today 2.  F/U in AM  Marisue Humble, PharmD Clinical Pharmacist [facility]

## 2011-06-09 NOTE — Progress Notes (Signed)
Physical Therapy Treatment Patient Details Name: Carlos Gonzalez MRN: [id number] DOB: [birth year] Today's Date: 06/09/2011 Time: 4098-1191 PT Time Calculation (min): 20 min  PT Assessment / Plan / Recommendation Comments on Treatment Session  Patient close to baseline - improving efficiency with functional mobilty. Cognition -memory remians an issue. I would recommend SLP consult.    Follow Up Recommendations  No PT follow up    Equipment Recommendations  Toilet rise with handles    Frequency Min 3X/week   Plan Discharge plan remains appropriate    Precautions / Restrictions Precautions Precautions: Fall   Pertinent Vitals/Pain N/A    Mobility  Bed Mobility Rolling Left: 7: Independent Left Sidelying to Sit: 7: Independent Sitting - Scoot to Edge of Bed: 6: Modified independent (Device/Increase time) Sit to Supine: 7: Independent Transfers Sit to Stand: 6: Modified independent (Device/Increase time);From chair/3-in-1;From bed;With upper extremity assist;With armrests Stand to Sit: 6: Modified independent (Device/Increase time);With armrests;With upper extremity assist;To bed;To chair/3-in-1 Ambulation/Gait Ambulation/Gait Assistance: 5: Supervision Ambulation Distance (Feet): 400 Feet Assistive device: None Ambulation/Gait Assistance Details: Worked on improved stride length and natural arm swing.  Gait Pattern: Step-through pattern;Decreased stride length Gait velocity: 2.29 feet/second - not indicative of recurrent falls.     PT Goals Acute Rehab PT Goals PT Goal: Sit to Stand - Progress: Met PT Goal: Stand to Sit - Progress: Met PT Goal: Ambulate - Progress: Progressing toward goal Additional Goals PT Goal: Additional Goal #1 - Progress: Met  Visit Information  Last PT Received On: 06/09/11 Assistance Needed: +1    Subjective Data  Subjective: Nursing report patient unable to recall that he had ambulated yesterday. When I entered the room he immediately  recalled what we had done yesterday but stated the same as the nurse that last night he could not recall walking. Patient Stated Goal: Improve thinking   Cognition  Overall Cognitive Status: Impaired Area of Impairment: Memory Memory Deficits: Poor short term memory    Balance  Standardized Balance Assessment Standardized Balance Assessment: Timed Up and Go Test Timed Up and Go Test TUG: Normal TUG Normal TUG (seconds): 12  (average of 3 trials - not inidicative of incr. falls risk) High Level Balance High Level Balance Activites: Side stepping;Direction changes;Turns;Head turns High Level Balance Comments: No evidence of imbalance with gait. 7 steps for 180 degree turn with gait in hall - with TUG test able to complete in 4 steps (4 steps not inidicative of falls)  End of Session PT - End of Session Equipment Utilized During Treatment: Gait belt Activity Tolerance: Patient tolerated treatment well Patient left: in bed;with call bell/phone within reach Nurse Communication: Mobility status    Edwyna Perfect, PT  Pager [phone]  06/09/2011, 8:19 AM

## 2011-06-09 NOTE — Consult Note (Signed)
[facility] CONSULT NOTE     Reason for Consult: Parkinson's Disease, Tremors, Mild dementia    HPI:    Carlos Gonzalez is an 78 y.o. male who was reported to have increased tremors and increased memory problems for several days prior to admission (per the patient).  I understood there were some coarse jerks of the UE's that were not typical for his Parkinson's tremors.  Question was raised whether this was a seizure.  The jerks were in both arms and there was no LOC. He had not had his Parkinsons medications and tremors are usually coarser per his daughter if he misses his meds.   He reports a diagnosis of Parkinson's was made 8-9 years ago at [facility].  He is followed locally by Dr. Sandria Manly.  There have not been any reported recent medication changes.  He says he gets lightheaded when he stands up, worse at sometimes than others.  It is noted he is on Florinef and presumably has some orthostatic hypotension, likely associated with his Parkinsons.  He denies dysphagia, SOB, multiple falls.  He said sometimes he will have a tremor in his leg when he is sitting in a chair.   He does admit to some mild memory problems.  The H&P reports he has had occasional hallucinations at night secondary to his Parkinson's medications and that his memory has been worse when he had UTI before.  There is also a dx of AFib, on coumadin.  A head CT without dye was negative for ICH.  He denied any focal neurological symptoms of numbness, tingling, loss of speech or other.  Past Medical History  Diagnosis Date  . Parkinson's disease     Dr Avie Echevaria  . Hypertension   . Hypothyroidism     Caused by Amiodarone, resolved  . Orthostatic hypotension     Chronic  . Atrial fibrillation   . Chronic anticoagulation   . BPH (benign prostatic hyperplasia)   . Cancer     hx of skin cancer;? basal cell  . Gastritis   . Shortness of breath   . Pneumonia     hx of   . Blood  dyscrasia     chronic anticoagulation    Past Surgical History  Procedure Date  . Hernia repair      X 4 total  . Appendectomy Age 46  . Knee surgery Age 81  . US echocardiography July 2012    EF 55-60%  . Cardiovascular stress test 09-04-2003    EF 66%  . Middle ear surgery     Family History  Problem Relation Age of Onset  . Dementia Mother   . Heart disease Father     CHF  . Heart disease Paternal Grandfather     MI  . Cancer Brother     LUNG  . Heart disease Brother     BY-PASS    Social History:  reports that he has never smoked. He has never used smokeless tobacco. He reports that he drinks alcohol. He reports that he does not use illicit drugs.  He lives in an ALF and is not driving.  Allergies  Allergen Reactions  . Sulfonamide Derivatives Nausea Only    1957 post knee surgery    Medications:    Prior to Admission:  Prescriptions prior to admission  Medication Sig Dispense Refill  .  carbidopa-levodopa (SINEMET IR) 25-100 MG per tablet Take 1 tablet by mouth 4 (four) times daily.      . chlorpheniramine (CHLOR-TRIMETON) 4 MG tablet Take 4 mg by mouth every evening.      . citalopram (CELEXA) 20 MG tablet Take 20 mg by mouth daily.      . digoxin (LANOXIN) 0.25 MG tablet TAKE 1/2 TABLET EVERY DAY  100 tablet  3  . fludrocortisone (FLORINEF) 0.1 MG tablet Take 0.1 mg by mouth daily.      Marland Kitchen rOPINIRole (REQUIP) 1 MG tablet Take 1-2 mg by mouth daily. 1 mg every morning and additional 1 mg at 8 pm on Monday, Wednesday, and Friday      . selegiline (ELDEPRYL) 5 MG tablet Take 5 mg by mouth daily.        Marland Kitchen warfarin (COUMADIN) 5 MG tablet Take 5-7.5 mg by mouth daily. 5 mg on Monday, Wednesday, and Friday; and 7.5 mg on Sunday, Tuesday, Thursday, and Saturday       Scheduled:   . carbidopa-levodopa  1 tablet Oral QID  . cefTRIAXone (ROCEPHIN)  IV  1 g Intravenous Q24H  . digoxin  0.25 mg Oral Daily  . docusate sodium  100 mg Oral BID  . fludrocortisone  0.1 mg  Oral Daily  . rOPINIRole  1-2 mg Oral Daily  . selegiline  5 mg Oral Daily  . senna  1 tablet Oral BID  . sodium chloride  3 mL Intravenous Q12H  . warfarin  10 mg Oral ONCE-1800  . warfarin  10 mg Oral ONCE-1800  . Warfarin - Pharmacist Dosing Inpatient   Does not apply q1800    Review of Systems - General ROS remarkable for fatigue.  Blood pressure 118/73, pulse 78, temperature 97.5 F (36.4 C), temperature source Oral, resp. rate 16, height 6' (1.829 m), weight 76.4 kg (168 lb 6.9 oz), SpO2 99.00%.   Neurologic Examination:   Patient is awake, alert, oriented to person, place, month, date but initially said the year was 2015.  He did recognize his mistake.  He was not having any obvious hallucinations.  Mild dysarthria was present.  EOM full.  No ptosis.  No facial asymmetry.  No nystagmus.  Light touch intact, symmetrical VR areas.  Mildly decreased hearing in general conversation.  No facial twitches were present.  Gross touch intact, symmetrical in UEs and LEs.  Power was 5/5 in UEs and LEs.  Intermittent resting tremor was noted in left moreso than right hand.  Tone was increased in the UEs and LEs.    Lab Results: Earlier TSH was normal in 04/2011.  Results for orders placed during the hospital encounter of 06/07/11 (from the past 48 hour(s))  GLUCOSE, CAPILLARY     Status: Normal   Collection Time   06/07/11  4:46 PM      Component Value Range Comment   Glucose-Capillary 84  70 - 99 (mg/dL)   CBC     Status: Abnormal   Collection Time   06/07/11  5:00 PM      Component Value Range Comment   WBC 4.9  4.0 - 10.5 (K/uL)    RBC 4.33  4.22 - 5.81 (MIL/uL)    Hemoglobin 13.9  13.0 - 17.0 (g/dL)    HCT 91.4  78.2 - 95.6 (%)    MCV 93.5  78.0 - 100.0 (fL)    MCH 32.1  26.0 - 34.0 (pg)    MCHC 34.3  30.0 - 36.0 (  g/dL)    RDW 16.1  09.6 - 04.5 (%)    Platelets 139 (*) 150 - 400 (K/uL)   DIFFERENTIAL     Status: Abnormal   Collection Time   06/07/11  5:00 PM      Component  Value Range Comment   Neutrophils Relative 48  43 - 77 (%)    Neutro Abs 2.3  1.7 - 7.7 (K/uL)    Lymphocytes Relative 34  12 - 46 (%)    Lymphs Abs 1.6  0.7 - 4.0 (K/uL)    Monocytes Relative 17 (*) 3 - 12 (%)    Monocytes Absolute 0.8  0.1 - 1.0 (K/uL)    Eosinophils Relative 2  0 - 5 (%)    Eosinophils Absolute 0.1  0.0 - 0.7 (K/uL)    Basophils Relative 1  0 - 1 (%)    Basophils Absolute 0.0  0.0 - 0.1 (K/uL)   COMPREHENSIVE METABOLIC PANEL     Status: Abnormal   Collection Time   06/07/11  5:00 PM      Component Value Range Comment   Sodium 144  135 - 145 (mEq/L)    Potassium 4.3  3.5 - 5.1 (mEq/L)    Chloride 107  96 - 112 (mEq/L)    CO2 29  19 - 32 (mEq/L)    Glucose, Bld 90  70 - 99 (mg/dL)    BUN 17  6 - 23 (mg/dL)    Creatinine, Ser 4.09  0.50 - 1.35 (mg/dL)    Calcium 9.3  8.4 - 10.5 (mg/dL)    Total Protein 6.4  6.0 - 8.3 (g/dL)    Albumin 3.8  3.5 - 5.2 (g/dL)    AST 20  0 - 37 (U/L)    ALT 10  0 - 53 (U/L)    Alkaline Phosphatase 62  39 - 117 (U/L)    Total Bilirubin 1.7 (*) 0.3 - 1.2 (mg/dL)    GFR calc non Af Amer 56 (*) >90 (mL/min)    GFR calc Af Amer 65 (*) >90 (mL/min)   TROPONIN I     Status: Normal   Collection Time   06/07/11  5:00 PM      Component Value Range Comment   Troponin I <0.30  <0.30 (ng/mL)   PROTIME-INR     Status: Abnormal   Collection Time   06/07/11  5:00 PM      Component Value Range Comment   Prothrombin Time 22.2 (*) 11.6 - 15.2 (seconds)    INR 1.91 (*) 0.00 - 1.49    DIGOXIN LEVEL     Status: Abnormal   Collection Time   06/07/11  5:00 PM      Component Value Range Comment   Digoxin Level 0.6 (*) 0.8 - 2.0 (ng/mL)   URINALYSIS, ROUTINE W REFLEX MICROSCOPIC     Status: Abnormal   Collection Time   06/07/11  5:01 PM      Component Value Range Comment   Color, Urine YELLOW  YELLOW     APPearance CLEAR  CLEAR     Specific Gravity, Urine 1.007  1.005 - 1.030     pH 5.5  5.0 - 8.0     Glucose, UA NEGATIVE  NEGATIVE (mg/dL)     Hgb urine dipstick NEGATIVE  NEGATIVE     Bilirubin Urine NEGATIVE  NEGATIVE     Ketones, ur NEGATIVE  NEGATIVE (mg/dL)    Protein, ur NEGATIVE  NEGATIVE (mg/dL)  Urobilinogen, UA 0.2  0.0 - 1.0 (mg/dL)    Nitrite NEGATIVE  NEGATIVE     Leukocytes, UA SMALL (*) NEGATIVE    URINE MICROSCOPIC-ADD ON     Status: Normal   Collection Time   06/07/11  5:01 PM      Component Value Range Comment   Squamous Epithelial / LPF RARE  RARE     WBC, UA 3-6  <3 (WBC/hpf)    Bacteria, UA RARE  RARE    URINE CULTURE     Status: Normal (Preliminary result)   Collection Time   06/07/11  5:01 PM      Component Value Range Comment   Specimen Description URINE, RANDOM      Special Requests NONE      Culture  Setup Time [id number]      Colony Count 10,000 COLONIES/ML      Culture GRAM NEGATIVE RODS      Report Status PENDING     URINE RAPID DRUG SCREEN (HOSP PERFORMED)     Status: Normal   Collection Time   06/08/11  1:45 AM      Component Value Range Comment   Opiates NONE DETECTED  NONE DETECTED     Cocaine NONE DETECTED  NONE DETECTED     Benzodiazepines NONE DETECTED  NONE DETECTED     Amphetamines NONE DETECTED  NONE DETECTED     Tetrahydrocannabinol NONE DETECTED  NONE DETECTED     Barbiturates NONE DETECTED  NONE DETECTED    COMPREHENSIVE METABOLIC PANEL     Status: Abnormal   Collection Time   06/08/11  5:20 AM      Component Value Range Comment   Sodium 144  135 - 145 (mEq/L)    Potassium 4.1  3.5 - 5.1 (mEq/L)    Chloride 108  96 - 112 (mEq/L)    CO2 29  19 - 32 (mEq/L)    Glucose, Bld 93  70 - 99 (mg/dL)    BUN 14  6 - 23 (mg/dL)    Creatinine, Ser 8.11  0.50 - 1.35 (mg/dL)    Calcium 8.8  8.4 - 10.5 (mg/dL)    Total Protein 5.8 (*) 6.0 - 8.3 (g/dL)    Albumin 3.5  3.5 - 5.2 (g/dL)    AST 15  0 - 37 (U/L)    ALT 11  0 - 53 (U/L)    Alkaline Phosphatase 55  39 - 117 (U/L)    Total Bilirubin 2.3 (*) 0.3 - 1.2 (mg/dL)    GFR calc non Af Amer 55 (*) >90 (mL/min)    GFR calc Af Amer  63 (*) >90 (mL/min)   CBC     Status: Abnormal   Collection Time   06/08/11  5:20 AM      Component Value Range Comment   WBC 5.2  4.0 - 10.5 (K/uL)    RBC 4.42  4.22 - 5.81 (MIL/uL)    Hemoglobin 14.0  13.0 - 17.0 (g/dL)    HCT 91.4  78.2 - 95.6 (%)    MCV 93.9  78.0 - 100.0 (fL)    MCH 31.7  26.0 - 34.0 (pg)    MCHC 33.7  30.0 - 36.0 (g/dL)    RDW 21.3  08.6 - 57.8 (%)    Platelets 135 (*) 150 - 400 (K/uL)   CARDIAC PANEL(CRET KIN+CKTOT+MB+TROPI)     Status: Normal   Collection Time   06/08/11  5:20 AM      Component  Value Range Comment   Total CK 79  7 - 232 (U/L)    CK, MB 2.7  0.3 - 4.0 (ng/mL)    Troponin I <0.30  <0.30 (ng/mL)    Relative Index RELATIVE INDEX IS INVALID  0.0 - 2.5    BLOOD GAS, ARTERIAL     Status: Abnormal   Collection Time   06/08/11  6:55 AM      Component Value Range Comment   Delivery systems ROOM AIR      pH, Arterial 7.436  7.350 - 7.450     pCO2 arterial 40.7  35.0 - 45.0 (mmHg)    pO2, Arterial 89.9  80.0 - 100.0 (mmHg)    Bicarbonate 26.9 (*) 20.0 - 24.0 (mEq/L)    TCO2 28.2  0 - 100 (mmol/L)    Acid-Base Excess 3.0 (*) 0.0 - 2.0 (mmol/L)    O2 Saturation 97.5      Patient temperature 98.6      Collection site RIGHT BRACHIAL      Drawn by [id number]      Sample type ARTERIAL      Allens test (pass/fail) NOT INDICATED (*) PASS    CARDIAC PANEL(CRET KIN+CKTOT+MB+TROPI)     Status: Normal   Collection Time   06/08/11 12:28 PM      Component Value Range Comment   Total CK 84  7 - 232 (U/L)    CK, MB 2.6  0.3 - 4.0 (ng/mL)    Troponin I <0.30  <0.30 (ng/mL)    Relative Index RELATIVE INDEX IS INVALID  0.0 - 2.5    CARDIAC PANEL(CRET KIN+CKTOT+MB+TROPI)     Status: Normal   Collection Time   06/08/11  9:16 PM      Component Value Range Comment   Total CK 64  7 - 232 (U/L)    CK, MB 2.2  0.3 - 4.0 (ng/mL)    Troponin I <0.30  <0.30 (ng/mL)    Relative Index RELATIVE INDEX IS INVALID  0.0 - 2.5    GLUCOSE, CAPILLARY     Status: Abnormal   Collection  Time   06/08/11 11:58 PM      Component Value Range Comment   Glucose-Capillary 116 (*) 70 - 99 (mg/dL)    Comment 1 Notify RN     PROTIME-INR     Status: Abnormal   Collection Time   06/09/11  5:20 AM      Component Value Range Comment   Prothrombin Time 20.2 (*) 11.6 - 15.2 (seconds)    INR 1.69 (*) 0.00 - 1.49    GLUCOSE, CAPILLARY     Status: Normal   Collection Time   06/09/11  7:48 AM      Component Value Range Comment   Glucose-Capillary 90  70 - 99 (mg/dL)    Comment 1 Notify RN       Dg Chest 2 View  06/07/2011  *RADIOLOGY REPORT*  Clinical Data: Altered mental status, weakness, history hypertension, Parkinson's, atrial fibrillation, skin cancer  CHEST - 2 VIEW  Comparison: 08/14/2010  Findings: Normal heart size, mediastinal contours, and pulmonary vascularity. Atherosclerotic calcification aortic arch. Probable bilateral nipple shadows, appear to be present on prior studies. No acute infiltrate, pleural effusion or pneumothorax. Underlying emphysematous changes. No acute osseous findings.  IMPRESSION: Emphysematous changes. No acute abnormalities.  Original Report Authenticated By: Lollie Marrow, M.D.   Ct Head Wo Contrast  06/07/2011  *RADIOLOGY REPORT*  Clinical Data: 78 year old male with altered mental status,  weakness.  CT HEAD WITHOUT CONTRAST  Technique:  Contiguous axial images were obtained from the base of the skull through the vertex without contrast.  Comparison: 08/14/2010.  Findings: Visualized paranasal sinuses and mastoids are clear. Chronic left stapes implant. No acute osseous abnormality identified.  Visualized orbits and scalp soft tissues are within normal limits.  Stable cerebral volume.  No ventriculomegaly.  Mild Calcified atherosclerosis at the skull base.  No midline shift, mass effect, or evidence of mass lesion.  No evidence of cortically based acute infarction identified.  No acute intracranial hemorrhage identified.  Gray-white matter differentiation is  within normal limits throughout the brain.  No suspicious intracranial vascular hyperdensity.  IMPRESSION: Stable and negative noncontrast CT appearance of the brain for age.  Original Report Authenticated By: Harley Hallmark, M.D.     Assessment/Plan:   Parkinson's Disease associated with mild dementia, presumed orthostasis and intermittent hallucinations reported as medication side effect.  The coarse jerks described were not likely to be epileptic.  This was discussed with his daughter and the patient.  His mental status appears baseline at present as I understand it.  He has regular follow-up with Dr. Sandria Manly.  I discussed that dementia and depression may be part of Parkinsons.  I doubt that his presentation was primarily neurological in nature.  All questions by the daughter and patient were answered.    Please call if there are further questions.   Hayden Rasmussen, MD [facility] [phone]  06/09/2011, 2:15 PM

## 2011-06-09 NOTE — Progress Notes (Signed)
Subjective: Doing better today No fevers Memory improved but not back to baseline    Objective: Vital signs in last 24 hours: Filed Vitals:   06/08/11 2000 06/09/11 0000 06/09/11 0400 06/09/11 0800  BP: 119/67 110/70 130/84 144/97  Pulse: 76 70 59 81  Temp: 97.5 F (36.4 C) 97.5 F (36.4 C) 97.6 F (36.4 C) 98 F (36.7 C)  TempSrc: Oral Oral Oral Oral  Resp: 18 18 18 20   Height:      Weight:      SpO2: 97% 99% 98% 97%   Weight change:   Intake/Output Summary (Last 24 hours) at 06/09/11 1030 Last data filed at 06/09/11 0853  Gross per 24 hour  Intake    720 ml  Output      0 ml  Net    720 ml    Physical Exam: General: Awake, Oriented, cooperative, No acute distress. HEENT: EOMI. Neck: Supple CV: irr Lungs: Clear to ascultation bilaterally, no wheezing Abdomen: Soft, Nontender, Nondistended, +bowel sounds. Ext: Good pulses. Trace edema.   Lab Results:  [facility] 06/08/11 0520 06/07/11 1700  NA 144 144  K 4.1 4.3  CL 108 107  CO2 29 29  GLUCOSE 93 90  BUN 14 17  CREATININE 1.22 1.20  CALCIUM 8.8 9.3  MG -- --  PHOS -- --    Basename 06/08/11 0520 06/07/11 1700  AST 15 20  ALT 11 10  ALKPHOS 55 62  BILITOT 2.3* 1.7*  PROT 5.8* 6.4  ALBUMIN 3.5 3.8   No results found for this basename: LIPASE:2,AMYLASE:2 in the last 72 hours  Basename 06/08/11 0520 06/07/11 1700  WBC 5.2 4.9  NEUTROABS -- 2.3  HGB 14.0 13.9  HCT 41.5 40.5  MCV 93.9 93.5  PLT 135* 139*    Basename 06/08/11 2116 06/08/11 1228 06/08/11 0520  CKTOTAL 64 84 79  CKMB 2.2 2.6 2.7  CKMBINDEX -- -- --  TROPONINI <0.30 <0.30 <0.30   No components found with this basename: POCBNP:3 No results found for this basename: DDIMER:2 in the last 72 hours No results found for this basename: HGBA1C:2 in the last 72 hours No results found for this basename: CHOL:2,HDL:2,LDLCALC:2,TRIG:2,CHOLHDL:2,LDLDIRECT:2 in the last 72 hours No results found for this basename:  TSH,T4TOTAL,FREET3,T3FREE,THYROIDAB in the last 72 hours No results found for this basename: VITAMINB12:2,FOLATE:2,FERRITIN:2,TIBC:2,IRON:2,RETICCTPCT:2 in the last 72 hours  Micro Results: Recent Results (from the past 240 hour(s))  URINE CULTURE     Status: Normal (Preliminary result)   Collection Time   06/07/11  5:01 PM      Component Value Range Status Comment   Specimen Description URINE, RANDOM   Final    Special Requests NONE   Final    Culture  Setup Time [id number]   Final    Colony Count 10,000 COLONIES/ML   Final    Culture GRAM NEGATIVE RODS   Final    Report Status PENDING   Incomplete     Studies/Results: Dg Chest 2 View  06/07/2011  *RADIOLOGY REPORT*  Clinical Data: Altered mental status, weakness, history hypertension, Parkinson's, atrial fibrillation, skin cancer  CHEST - 2 VIEW  Comparison: 08/14/2010  Findings: Normal heart size, mediastinal contours, and pulmonary vascularity. Atherosclerotic calcification aortic arch. Probable bilateral nipple shadows, appear to be present on prior studies. No acute infiltrate, pleural effusion or pneumothorax. Underlying emphysematous changes. No acute osseous findings.  IMPRESSION: Emphysematous changes. No acute abnormalities.  Original Report Authenticated By: Lollie Marrow, M.D.   Ct Head Wo  Contrast  06/07/2011  *RADIOLOGY REPORT*  Clinical Data: 78 year old male with altered mental status, weakness.  CT HEAD WITHOUT CONTRAST  Technique:  Contiguous axial images were obtained from the base of the skull through the vertex without contrast.  Comparison: 08/14/2010.  Findings: Visualized paranasal sinuses and mastoids are clear. Chronic left stapes implant. No acute osseous abnormality identified.  Visualized orbits and scalp soft tissues are within normal limits.  Stable cerebral volume.  No ventriculomegaly.  Mild Calcified atherosclerosis at the skull base.  No midline shift, mass effect, or evidence of mass lesion.  No evidence of  cortically based acute infarction identified.  No acute intracranial hemorrhage identified.  Gray-white matter differentiation is within normal limits throughout the brain.  No suspicious intracranial vascular hyperdensity.  IMPRESSION: Stable and negative noncontrast CT appearance of the brain for age.  Original Report Authenticated By: Harley Hallmark, M.D.    Medications: I have reviewed the patient's current medications. Scheduled Meds:    . carbidopa-levodopa  1 tablet Oral QID  . cefTRIAXone (ROCEPHIN)  IV  1 g Intravenous Q24H  . digoxin  0.25 mg Oral Daily  . docusate sodium  100 mg Oral BID  . fludrocortisone  0.1 mg Oral Daily  . rOPINIRole  1-2 mg Oral Daily  . selegiline  5 mg Oral Daily  . senna  1 tablet Oral BID  . sodium chloride  3 mL Intravenous Q12H  . warfarin  10 mg Oral ONCE-1800  . Warfarin - Pharmacist Dosing Inpatient   Does not apply q1800   Continuous Infusions:  PRN Meds:.acetaminophen, acetaminophen, ondansetron (ZOFRAN) IV, ondansetron  Assessment/Plan: 1. Altered mental status: Overall this seems like delirium on mild Parkinson's, however per report  the Parkinson's is mild and usually his mental status is appropriate. W/u so far is unrevealing- await repeat CT scan as patient unable to have MRI   Although family reports he's been this way with UTI previously, his UA is very unimpressive, UCx pending. Medication effect (sinemet, celexa, chlorpheniramine/anticholinergic, requip all listed on med list) vs non-focal stroke also considered. Patient denies any new medications- D/C celexa. Per old office notes there have been issues with his medication doses- ? Taking too much? Family concerned about seizure- doubtful this is the case- request Neuro consult (called into Dr. Bettey Costa)  2. AFib: Continue home coumadin and digoxin, trend daily INR   3. Elevated Tbili: Pt with Gilbert's noted on problem list.   4. Continue home fludocortisone  5. ? UTI- await  culture- start rocephin  PT to eval patient    LOS: 2 days  Jamarri Vuncannon, DO 06/09/2011, 10:30 AM

## 2011-06-09 NOTE — Evaluation (Signed)
Speech Language Pathology Evaluation Patient Details Name: Carlos Gonzalez MRN: [id number] DOB: [birth year] Today's Date: 06/09/2011 Time: 4098-1191 SLP Time Calculation (min): 35 min  Problem List:  Patient Active Problem List  Diagnoses  . HYPOTHYROIDISM  . HYPERKALEMIA  . GILBERT'S SYNDROME  . COAGULOPATHY, COUMADIN-INDUCED  . PARKINSON'S DISEASE  . ATRIAL FIBRILLATION  . ALLERGIC RHINITIS CAUSE UNSPECIFIED  . PNEUMONIA, RIGHT LOWER LOBE  . STRICTURE AND STENOSIS OF ESOPHAGUS  . BENIGN PROSTATIC HYPERTROPHY  . DIZZINESS  . SLEEP DISORDER  . DYSPHAGIA UNSPECIFIED  . Nonspecific (abnormal) findings on radiological and other examination of body structure  . UNS ADVRS EFF UNS RX MEDICINAL&BIOLOGICAL SBSTNC  . SKIN CANCER, HX OF  . Personal history of colonic polyps  . BENIGN PROSTATIC HYPERTROPHY, HX OF  . CHEST XRAY, ABNORMAL  . Orthostatic hypotension  . Altered mental status  . Atrial fibrillation   Past Medical History:  Past Medical History  Diagnosis Date  . Parkinson's disease     Dr Avie Echevaria  . Hypertension   . Hypothyroidism     Caused by Amiodarone, resolved  . Orthostatic hypotension     Chronic  . Atrial fibrillation   . Chronic anticoagulation   . BPH (benign prostatic hyperplasia)   . Cancer     hx of skin cancer;? basal cell  . Gastritis   . Shortness of breath   . Pneumonia     hx of   . Blood dyscrasia     chronic anticoagulation   Past Surgical History:  Past Surgical History  Procedure Date  . Hernia repair      X 4 total  . Appendectomy Age 58  . Knee surgery Age 17  . US echocardiography July 2012    EF 55-60%  . Cardiovascular stress test 09-04-2003    EF 66%  . Middle ear surgery     Assessment / Plan / Recommendation Clinical Impression  Cognitive function appears [facility] for tasks assessed. Patient with intact problem solving skills and great awareness of PMH and deficits, reporting short term memory deficits x 1 year that have  worsened over the past 2-3 days. Suspect that admitting AMS is now resolving with improved medical status. PT who worked with patient yesterday however did endorse some noted short term memory deficits and given that patient is independent with ADLs, he would benefit from short term SLP f/u for reinforcement of compensatory strategies to faciliate short term recall of important daily information.     SLP Assessment  Patient needs continued Speech Lanaguage Pathology Services    Follow Up Recommendations  Home health SLP    Frequency and Duration min 1 x/week  2 weeks   Pertinent Vitals/Pain n/a   SLP Goals  SLP Goals Potential to Achieve Goals: Good Potential Considerations: Co-morbidities Progress/Goals/Alternative treatment plan discussed with pt/caregiver and they: Agree SLP Goal #1: Patient will utilize compensatory strategies to recall pertinent daily information with supervision SLP Goal #1 - Progress: Not met  SLP Evaluation Prior Functioning  Cognitive/Linguistic Baseline: Baseline deficits Baseline deficit details: short term memory deficits for 1 year per patient, now worse Type of Home: Assisted living Available Help at Discharge: Available PRN/intermittently;Family   Cognition  Overall Cognitive Status: Appears within functional limits for tasks assessed Arousal/Alertness: Awake/alert Orientation Level: Oriented X4 Attention: Sustained;Selective Sustained Attention: Appears intact Selective Attention: Appears intact Memory: Appears intact Awareness: Appears intact Problem Solving: Appears intact Behaviors:  (occassionally verbose but appropriate) Safety/Judgment: Appears intact  Comprehension  Auditory Comprehension Overall Auditory Comprehension: Appears within functional limits for tasks assessed    Expression Expression Primary Mode of Expression: Verbal Verbal Expression Overall Verbal Expression: Appears within functional limits for tasks assessed     Oral / Motor Oral Motor/Sensory Function Overall Oral Motor/Sensory Function: Appears within functional limits for tasks assessed Motor Speech Overall Motor Speech: Appears within functional limits for tasks assessed   Ferdinand Lango MA, CCC-SLP [phone]   Carlos Gonzalez 06/09/2011, 1:28 PM

## 2011-06-09 NOTE — Progress Notes (Signed)
Nutrition Brief Note  RD pulled again to patient regarding MAOI report. Patient is currently receiving selegiline (ELDEPRYL). Low Tyramine diet education booklet provided. RD spoke with patient's daughter via telephone informing reason for visitation; discussed general diet guidelines. Telephone orders received per Dr. Benjamine Mola to change patient's diet order to Low Tyramine. Please consult RD as needed.  Alger Memos, RD Pager #: [phone]

## 2011-06-10 DIAGNOSIS — I4891 Unspecified atrial fibrillation: Secondary | ICD-10-CM

## 2011-06-10 DIAGNOSIS — R4182 Altered mental status, unspecified: Secondary | ICD-10-CM

## 2011-06-10 DIAGNOSIS — G20 Parkinson's disease: Secondary | ICD-10-CM

## 2011-06-10 DIAGNOSIS — F329 Major depressive disorder, single episode, unspecified: Secondary | ICD-10-CM

## 2011-06-10 LAB — PROTIME-INR: INR: 2 — ABNORMAL HIGH (ref 0.00–1.49)

## 2011-06-10 MED ORDER — WARFARIN SODIUM 5 MG PO TABS
5.0000 mg | ORAL_TABLET | ORAL | Status: DC
  Administered 2011-06-10: 5 mg via ORAL
  Filled 2011-06-10: qty 1

## 2011-06-10 MED ORDER — MICONAZOLE NITRATE 2 % EX CREA
TOPICAL_CREAM | Freq: Two times a day (BID) | CUTANEOUS | Status: DC
  Administered 2011-06-10 – 2011-06-12 (×4): via TOPICAL
  Filled 2011-06-10: qty 14

## 2011-06-10 MED ORDER — CARBIDOPA-LEVODOPA 25-100 MG PO TABS
1.0000 | ORAL_TABLET | Freq: Three times a day (TID) | ORAL | Status: DC
  Administered 2011-06-11 – 2011-06-12 (×5): 1 via ORAL
  Filled 2011-06-10 (×7): qty 1

## 2011-06-10 MED ORDER — HALOPERIDOL LACTATE 5 MG/ML IJ SOLN
5.0000 mg | Freq: Once | INTRAMUSCULAR | Status: AC
  Administered 2011-06-10: 5 mg via INTRAMUSCULAR
  Filled 2011-06-10: qty 1

## 2011-06-10 MED ORDER — ROPINIROLE HCL 0.5 MG PO TABS
0.5000 mg | ORAL_TABLET | Freq: Every day | ORAL | Status: DC
  Administered 2011-06-11 – 2011-06-12 (×2): 0.5 mg via ORAL
  Filled 2011-06-10 (×2): qty 1

## 2011-06-10 MED ORDER — WARFARIN SODIUM 7.5 MG PO TABS
7.5000 mg | ORAL_TABLET | ORAL | Status: DC
  Administered 2011-06-11: 7.5 mg via ORAL
  Filled 2011-06-10 (×2): qty 1

## 2011-06-10 NOTE — Progress Notes (Addendum)
Physical Therapy Treatment and Discharge Summary Patient Details Name: Carlos Gonzalez MRN: [id number] DOB: [birth year] Today's Date: 06/10/2011 Time: 8657-8469 PT Time Calculation (min): 22 min  PT Assessment / Plan / Recommendation Comments on Treatment Session  Patient is at baseline for his functional mobility. He does have baseline deficits secondary to parkinson's. Physically he is safe to discharge back to indep. living. However, given events of last night and memory issues he may benefit from a [facility] safety eval. No further skilled acute needs are required.    Follow Up Recommendations  Home health PT (safety eval)    Equipment Recommendations  None recommended by PT    Frequency  N/A  Plan Discharge plan needs to be updated;All goals met and education completed, patient dischaged from PT services - patient is in agreement.    Precautions / Restrictions Precautions Precautions: Fall Precaution Comments: secondary to cognitive status   Pertinent Vitals/Pain N/A    Mobility  Bed Mobility Sit to Supine: 7: Independent Transfers Sit to Stand: 7: Independent;From bed Stand to Sit: 7: Independent;To bed Ambulation/Gait Ambulation/Gait Assistance: 6: Modified independent (Device/Increase time) Ambulation Distance (Feet): 700 Feet Assistive device: None Ambulation/Gait Assistance Details: Patient with tendency for slower speeds with initial ambulation, but then able to extend stride length volitionally     PT Goals Acute Rehab PT Goals PT Goal: Sit to Stand - Progress: Met PT Goal: Stand to Sit - Progress: Met PT Goal: Ambulate - Progress: Met  Visit Information  Last PT Received On: 06/10/11 Assistance Needed: +1    Subjective Data  Subjective: Patient reports having strange dreams last night. He was not aware that he had moved rooms and did question if he was still in the hospital.  Patient Stated Goal: Stop feeling confused   Cognition  Overall Cognitive Status:  Impaired Area of Impairment: Memory Orientation Level: Disoriented to;Situation Behavior During Session: [facility] for tasks performed    Balance  High Level Balance High Level Balance Activites: Side stepping;Direction changes;Turns;Sudden stops;Head turns High Level Balance Comments: No evidence of imbalance  End of Session PT - End of Session Equipment Utilized During Treatment: Gait belt Activity Tolerance: Patient tolerated treatment well Patient left: in bed;with call bell/phone within reach;with bed alarm set Nurse Communication: Mobility status    Edwyna Perfect, PT  Pager [phone]  06/10/2011, 10:19 AM

## 2011-06-10 NOTE — Care Management Note (Signed)
    Page 1 of 1   06/10/2011     12:25:58 PM   CARE MANAGEMENT NOTE 06/10/2011  Patient:  Carlos Gonzalez, Carlos Gonzalez   Account Number:  [id number]  Date Initiated:  06/08/2011  Documentation initiated by:  Alvira Philips Assessment:   77 yr-old male adm with AMS; lives alone in [place] apt.     Action/Plan:   Anticipated DC Date:     Anticipated DC Plan:  HOME W HOME HEALTH SERVICES  In-house referral  Clinical Social Worker      DC Planning Services  CM consult      Choice offered to / List presented to:             Status of service:   Medicare Important Message given?   (If response is "NO", the following Medicare IM given date fields will be blank) Date Medicare IM given:   Date Additional Medicare IM given:    Discharge Disposition:    Per UR Regulation:    If discussed at Long Length of Stay Meetings, dates discussed:    Comments:  PCP:  Dr. Marga Melnick  Contact:  Niklaus Mamaril, son [phone]  5/3 12:24p debbie Gideon Burstein rn,bsn pt confused during nite. md to get psych consult. sw following in case higher level of care needed.

## 2011-06-10 NOTE — Consult Note (Addendum)
Reason for Consult:Dementia, Acting out  Referring Physician: Dr. [name] is an 78 y.o. male.  HPI:Pt recently moved into ALF and family noted he was delirious and was brought to ED. Family associated this with sx with last UTI.  Staff reports he was very disoriented last night and was combative with nurses.   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 Parkinson's disease     Dr Avie Echevaria  . Hypertension   . Hypothyroidism     Caused by Amiodarone, resolved  . Orthostatic hypotension     Chronic  . Atrial fibrillation   . Chronic anticoagulation   . BPH (benign prostatic hyperplasia)   . Cancer     hx of skin cancer;? basal cell  . Gastritis   . Shortness of breath   . Pneumonia     hx of   . Blood dyscrasia     chronic anticoagulation  Axis IV: Recent move to ALF, marital -impending divorce, medical limitations/mental health Axis V:  GAF   Family History  Problem Relation Age of Onset  . Dementia Mother   . Heart disease Father     CHF  . Heart disease Paternal Grandfather     MI  . Cancer Brother     LUNG  . Heart disease Brother     BY-PASS    Social History:  reports that he has never smoked. He has never used smokeless tobacco. He reports that he drinks alcohol. He reports that he does not use illicit drugs.  Allergies:  Allergies  Allergen Reactions  . Sulfonamide Derivatives Nausea Only    1957 post knee surgery    Medications: I have reviewed patient's current medication  Results for orders placed during the hospital encounter of 06/07/11 (from the past 48 hour(s))  GLUCOSE, CAPILLARY     Status: Abnormal   Collection Time   06/08/11 11:58 PM      Component Value Range Comment   Glucose-Capillary 116 (*) 70 - 99 (mg/dL)    Comment 1 Notify RN     PROTIME-INR     Status: Abnormal   Collection Time   06/09/11  5:20 AM      Component Value Range Comment   Prothrombin Time 20.2 (*) 11.6 - 15.2 (seconds)    INR 1.69 (*) 0.00 - 1.49    GLUCOSE, CAPILLARY     Status: Normal   Collection Time   06/09/11  7:48 AM      Component Value Range Comment   Glucose-Capillary 90  70 - 99 (mg/dL)    Comment 1 Notify RN     GLUCOSE, CAPILLARY     Status: Normal   Collection Time   06/09/11  4:34 PM      Component Value Range Comment   Glucose-Capillary 79  70 - 99 (mg/dL)    Comment 1 Notify RN     GLUCOSE, CAPILLARY     Status: Normal   Collection Time   06/09/11 11:50 PM      Component Value Range Comment   Glucose-Capillary 98  70 - 99 (mg/dL)    Comment 1 Notify RN     PROTIME-INR     Status: Abnormal   Collection Time   06/10/11  5:28 AM      Component Value Range Comment   Prothrombin Time 23.0 (*) 11.6 - 15.2 (seconds)  INR 2.00 (*) 0.00 - 1.49    GLUCOSE, CAPILLARY     Status: Abnormal   Collection Time   06/10/11  7:40 AM      Component Value Range Comment   Glucose-Capillary 104 (*) 70 - 99 (mg/dL)    Comment 1 Notify RN     GLUCOSE, CAPILLARY     Status: Abnormal   Collection Time   06/10/11  4:34 PM      Component Value Range Comment   Glucose-Capillary 104 (*) 70 - 99 (mg/dL)     Ct Head Wo Contrast  06/09/2011  *RADIOLOGY REPORT*  Clinical Data: Evaluate for stroke.  Complete memory loss for 2 days.  CT HEAD WITHOUT CONTRAST  Technique:  Contiguous axial images were obtained from the base of the skull through the vertex without contrast.  Comparison: Head CT 06/07/2011  Findings: No acute intracranial abnormality is identified.  The cerebral volume is stable.  Ventricles are normal and stable in size.  Negative for hemorrhage, hydrocephalus, mass effect, mass lesion, or evidence of acute cortically based infarction. Atherosclerotic calcification of the intracranial internal carotid arteries noted.  Left stapes implant again noted.  The paranasal sinuses and mastoid air cells are clear.  The skull is intact. Soft tissues of the scalp are  symmetric.  IMPRESSION: Stable exam.  No acute intracranial abnormality.  Original Report Authenticated By: Britta Mccreedy, M.D.    Review of Systems  Unable to perform ROS: other   Blood pressure 105/70, pulse 73, temperature 97.7 F (36.5 C), temperature source Axillary, resp. rate 18, height 6' (1.829 m), weight 76.4 kg (168 lb 6.9 oz), SpO2 98.00%. Physical Exam  Assessment/Plan:  Chart reviewed  Evaluation of patient ~ 4:30 pm 06/10/11 Mental Status Evaluation: Appearance: Uderweight, cachetic, steady stare while speaking Behavior:  Rigidity in ROM noted.  L>R Speech: Soft spoken, Appropriate grammar/syntax Mood: mildly confused Affect: mood congruent  blunted Thought Process: Organized, sequential  Thought Content: reporting about ALF activities, ones he enjoyed the most; recognition something happened last night; cannot remember it Sensorium: alert, oriented to person, place, situation Cognition: Has cognition intact,   Insight: good Judgment: good  This pt has a combative, confused episode last night.  There is a strong possibility the withholding of Zoloft and other medications were contributing factors.  Today he has a very clear thought process about his illness, limitations, enjoyment of ALF and recognition of supportive family.  He lists all the activities and amenities of his ALF and says he loves being there.  He slowly states his second wife is filing for divorce. With minimal emotion.  He has the placid facies of Parkinson's Disease with minimal tremor. He is aware of some signs of short term memory loss. He denies Suicidal Ideation/ AH/VH.  He is cognitively intact.  RECOMMENDATION 1. Resume SSRI,Zolort or equivalent and other medications Requip [[increases Dopamine] when safe to do so 2. He may need Risperdal, risperidone, M tab 0.5 mg at night to avoid 'sundowning' agitation 3. Ho evidence of delirium is noted today; He has capacity to determine his medical care. 4. Pt  wants to return to ALF when medically stable.   Carlos Gonzalez 06/10/2011, 11:02 PM

## 2011-06-10 NOTE — Progress Notes (Signed)
Subjective: Sleepier today But will awaken, confused about place, time but knows self Had episode of violence and confusion last PM- received haldol     Objective: Vital signs in last 24 hours: Filed Vitals:   06/09/11 1600 06/09/11 2000 06/10/11 0415 06/10/11 0800  BP: 152/95 131/85 150/93 116/81  Pulse: 74 74 77 74  Temp: 97.5 F (36.4 C) 97.5 F (36.4 C)  97.5 F (36.4 C)  TempSrc: Oral Oral  Oral  Resp: 18 18 16 18   Height:      Weight:      SpO2: 98% 98% 97% 94%   Weight change:   Intake/Output Summary (Last 24 hours) at 06/10/11 1326 Last data filed at 06/09/11 1800  Gross per 24 hour  Intake    760 ml  Output    200 ml  Net    560 ml    Physical Exam: General: Awake, Oriented, cooperative, No acute distress. HEENT: EOMI. Neck: Supple CV: irr Lungs: Clear to ascultation bilaterally, no wheezing Abdomen: Soft, Nontender, Nondistended, +bowel sounds. Ext: Good pulses. Trace edema.   Lab Results:  [facility] 06/08/11 0520 06/07/11 1700  NA 144 144  K 4.1 4.3  CL 108 107  CO2 29 29  GLUCOSE 93 90  BUN 14 17  CREATININE 1.22 1.20  CALCIUM 8.8 9.3  MG -- --  PHOS -- --    Basename 06/08/11 0520 06/07/11 1700  AST 15 20  ALT 11 10  ALKPHOS 55 62  BILITOT 2.3* 1.7*  PROT 5.8* 6.4  ALBUMIN 3.5 3.8   No results found for this basename: LIPASE:2,AMYLASE:2 in the last 72 hours  Basename 06/08/11 0520 06/07/11 1700  WBC 5.2 4.9  NEUTROABS -- 2.3  HGB 14.0 13.9  HCT 41.5 40.5  MCV 93.9 93.5  PLT 135* 139*    Basename 06/08/11 2116 06/08/11 1228 06/08/11 0520  CKTOTAL 64 84 79  CKMB 2.2 2.6 2.7  CKMBINDEX -- -- --  TROPONINI <0.30 <0.30 <0.30   No components found with this basename: POCBNP:3 No results found for this basename: DDIMER:2 in the last 72 hours No results found for this basename: HGBA1C:2 in the last 72 hours No results found for this basename: CHOL:2,HDL:2,LDLCALC:2,TRIG:2,CHOLHDL:2,LDLDIRECT:2 in the last 72 hours No results  found for this basename: TSH,T4TOTAL,FREET3,T3FREE,THYROIDAB in the last 72 hours No results found for this basename: VITAMINB12:2,FOLATE:2,FERRITIN:2,TIBC:2,IRON:2,RETICCTPCT:2 in the last 72 hours  Micro Results: Recent Results (from the past 240 hour(s))  URINE CULTURE     Status: Normal   Collection Time   06/07/11  5:01 PM      Component Value Range Status Comment   Specimen Description URINE, RANDOM   Final    Special Requests NONE   Final    Culture  Setup Time [id number]   Final    Colony Count 10,000 COLONIES/ML   Final    Culture KLEBSIELLA PNEUMONIAE   Final    Report Status 06/09/2011 FINAL   Final    Organism ID, Bacteria KLEBSIELLA PNEUMONIAE   Final     Studies/Results: Ct Head Wo Contrast  06/09/2011  *RADIOLOGY REPORT*  Clinical Data: Evaluate for stroke.  Complete memory loss for 2 days.  CT HEAD WITHOUT CONTRAST  Technique:  Contiguous axial images were obtained from the base of the skull through the vertex without contrast.  Comparison: Head CT 06/07/2011  Findings: No acute intracranial abnormality is identified.  The cerebral volume is stable.  Ventricles are normal and stable in size.  Negative for  hemorrhage, hydrocephalus, mass effect, mass lesion, or evidence of acute cortically based infarction. Atherosclerotic calcification of the intracranial internal carotid arteries noted.  Left stapes implant again noted.  The paranasal sinuses and mastoid air cells are clear.  The skull is intact. Soft tissues of the scalp are symmetric.  IMPRESSION: Stable exam.  No acute intracranial abnormality.  Original Report Authenticated By: Britta Mccreedy, M.D.    Medications: I have reviewed the patient's current medications. Scheduled Meds:    . carbidopa-levodopa  1 tablet Oral QID  . cefTRIAXone (ROCEPHIN)  IV  1 g Intravenous Q24H  . digoxin  0.25 mg Oral Daily  . docusate sodium  100 mg Oral BID  . fludrocortisone  0.1 mg Oral Daily  . haloperidol lactate  5 mg  Intramuscular Once  . LORazepam  0.5 mg Intravenous Once  . miconazole   Topical BID  . rOPINIRole  1-2 mg Oral Daily  . selegiline  5 mg Oral Daily  . senna  1 tablet Oral BID  . sodium chloride  3 mL Intravenous Q12H  . warfarin  10 mg Oral ONCE-1800  . warfarin  5 mg Oral Q M,W,F-1800  . warfarin  7.5 mg Oral Custom  . Warfarin - Pharmacist Dosing Inpatient   Does not apply q1800   Continuous Infusions:  PRN Meds:.acetaminophen, acetaminophen, ondansetron (ZOFRAN) IV, ondansetron  Assessment/Plan: 1. Altered mental status: Overall this seems like delirium on Parkinson's, however per report  the Parkinson's is mild and usually his mental status is appropriate. W/u so far is unrevealing- repeat CT scan as patient unable to have MRI  Did not show stroke.  Although family reports he's been this way with UTI previously, his UA is very unimpressive, UCx shows klebsiella. Medication effect (sinemet, celexa, chlorpheniramine/anticholinergic, requip all listed on med list) v Patient denies any new medications- D/C celexa. Per old office notes there have been issues with his medication doses- ? Taking too much? Family concerned about seizure- doubtful this is the case- request Neuro consult (called into Dr. Bettey Costa)  2. AFib: Continue home coumadin and digoxin, trend daily INR   3. Elevated Tbili: Pt with Gilbert's noted on problem list.   4. Continue home fludocortisone  5. ? UTI- await culture- start rocephin- klebsiella (small growth < 10,000)  PT to eval- does well physically    LOS: 3 days  Carlos Goodwill, DO 06/10/2011, 1:26 PM

## 2011-06-10 NOTE — Progress Notes (Signed)
Spoke with Dr. Sandria Manly  Plan is to wean off Requip to D/C D/C selegiline Change sinemet to TID (8/11/4)   See if this helps his mental status  Marlin Canary DO

## 2011-06-10 NOTE — Progress Notes (Signed)
Upon initial assessment, pt stated that he needed assistance with extending foreskin over his penis.  Foreskin retracted back during his bath earlier today, but pt had forgotten about it. Penis head was swollen, red, and tender to touch. Pt is still able to urinate without any problems. Lubricant was used to attempt to pull foreskin over head, but it was unsuccessful. Ice pack placed for comfort.  NP,  Lenny Pastel called and made aware, ordered to attempt again and continue with ice pack if pt tolerates it.   Another attempt was made with charge nurse at bedside, but unsuccessful. NP notified again, said he will call urologist for assistance.   NP contacted urologist, who will asses pt later tonight.
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
## 2011-06-10 NOTE — Progress Notes (Addendum)
ANTICOAGULATION CONSULT NOTE - Follow Up Consult  Pharmacy Consult for Coumadin Indication: atrial fibrillation  Allergies  Allergen Reactions  . Sulfonamide Derivatives Nausea Only    1955 post knee surgery    Patient Measurements: Height: 6' (182.9 cm) Weight: 168 lb 6.9 oz (76.4 kg) IBW/kg (Calculated) : 77.6   Vital Signs: BP: 150/93 mmHg (05/03 0415) Pulse Rate: 77  (05/03 0415)  Labs:  Alvira Philips 06/10/11 0528 06/09/11 0520 06/08/11 2116 06/08/11 1228 06/08/11 0520 06/07/11 1700  HGB -- -- -- -- 14.0 13.9  HCT -- -- -- -- 41.5 40.5  PLT -- -- -- -- 135* 139*  APTT -- -- -- -- -- --  LABPROT 23.0* 20.2* -- -- -- 22.2*  INR 2.00* 1.69* -- -- -- 1.91*  HEPARINUNFRC -- -- -- -- -- --  CREATININE -- -- -- -- 1.22 1.20  CKTOTAL -- -- 64 84 79 --  CKMB -- -- 2.2 2.6 2.7 --  TROPONINI -- -- <0.30 <0.30 <0.30 --   Estimated Creatinine Clearance: 53.1 ml/min (by C-G formula based on Cr of 1.22).   Medications:  Scheduled:     . carbidopa-levodopa  1 tablet Oral QID  . cefTRIAXone (ROCEPHIN)  IV  1 g Intravenous Q24H  . digoxin  0.25 mg Oral Daily  . docusate sodium  100 mg Oral BID  . fludrocortisone  0.1 mg Oral Daily  . haloperidol lactate  5 mg Intramuscular Once  . LORazepam  0.5 mg Intravenous Once  . rOPINIRole  1-2 mg Oral Daily  . selegiline  5 mg Oral Daily  . senna  1 tablet Oral BID  . sodium chloride  3 mL Intravenous Q12H  . warfarin  10 mg Oral ONCE-1800  . Warfarin - Pharmacist Dosing Inpatient   Does not apply q1800    Assessment: 78yo male with AFib CVR on digoxin last level 4/30 ok at 0.6.  INR 2.0 at goal 2-3 with some boost doses Coumadin 10mg  after missing 4/30 dose .  No bleeding issues noted.  Last cbc 5/1 stable.   UTI Kleb sensitivities pending - on Day 3 Ceftriaxone - afebrile  Goal of Therapy:  INR 2-3   Plan:  1.  Restart Coumadin 5mg  MWF and 7.5mg  TTSS - home dose 2.  Daily INR

## 2011-06-11 DIAGNOSIS — R4182 Altered mental status, unspecified: Secondary | ICD-10-CM

## 2011-06-11 DIAGNOSIS — G20 Parkinson's disease: Secondary | ICD-10-CM

## 2011-06-11 DIAGNOSIS — G20A1 Parkinson's disease without dyskinesia, without mention of fluctuations: Secondary | ICD-10-CM

## 2011-06-11 DIAGNOSIS — I4891 Unspecified atrial fibrillation: Secondary | ICD-10-CM

## 2011-06-11 LAB — BASIC METABOLIC PANEL
BUN: 17 mg/dL (ref 6–23)
Calcium: 8.9 mg/dL (ref 8.4–10.5)
GFR calc Af Amer: 73 mL/min — ABNORMAL LOW (ref >90)
GFR calc non Af Amer: 63 mL/min — ABNORMAL LOW (ref >90)
Glucose, Bld: 91 mg/dL (ref 70–99)
Sodium: 142 mEq/L (ref 135–145)

## 2011-06-11 LAB — CBC
HCT: 45.1 % (ref 39.0–52.0)
Hemoglobin: 15.3 g/dL (ref 13.0–17.0)
MCH: 31.7 pg (ref 26.0–34.0)
MCHC: 33.9 g/dL (ref 30.0–36.0)
RDW: 13.2 % (ref 11.5–15.5)

## 2011-06-11 LAB — PROTIME-INR
INR: 2.36 — ABNORMAL HIGH (ref 0.00–1.49)
Prothrombin Time: 26.2 seconds — ABNORMAL HIGH (ref 11.6–15.2)

## 2011-06-11 MED ORDER — CITALOPRAM HYDROBROMIDE 20 MG PO TABS
20.0000 mg | ORAL_TABLET | Freq: Every day | ORAL | Status: DC
  Administered 2011-06-11 – 2011-06-12 (×2): 20 mg via ORAL
  Filled 2011-06-11 (×2): qty 1

## 2011-06-11 NOTE — Progress Notes (Signed)
[facility] hospitalist progress note Chief complaint. Retracted penile foreskin. History of present illness this 78 year old male in hospital with altered mental status.. The patient to was bathed earlier today and the foreskin was retracted to clean the patient and had not returned back to the extended position over the glans penis. Nursing staff called me to tell me they were unable to extend the foreskin back to its normal position and the patient was having pain and developing some edema. I did contact Dr. Patsi Sears of urology and  he provided me suggestions on how to attempt to extend the foreskin over the glans penis. I did  attempt this 3 times unsuccessfully and ultimately the patient asked me to stop due to the level of pain. I did recontact Dr. Patsi Sears who kindly agreed to see the patient later this a.m. and attempt to extend the foreskin back to its normal anatomical position.

## 2011-06-11 NOTE — Progress Notes (Signed)
Subjective: Awake, sitting in chair Appropriate with questions    Objective: Vital signs in last 24 hours: Filed Vitals:   06/10/11 1200 06/10/11 2000 06/11/11 0000 06/11/11 0400  BP: 114/74 105/70 113/77 122/87  Pulse: 74 73 66 73  Temp: 98 F (36.7 C) 97.7 F (36.5 C) 97.6 F (36.4 C) 97.9 F (36.6 C)  TempSrc: Oral Axillary Axillary Axillary  Resp: 18 18 18 18   Height:      Weight:      SpO2: 93% 98% 99% 97%   Weight change:  No intake or output data in the 24 hours ending 06/11/11 1027  Physical Exam: General: Awake, Oriented to person/time, cooperative, No acute distress. HEENT: EOMI. Neck: Supple CV: irr Lungs: Clear to ascultation bilaterally, no wheezing Abdomen: Soft, Nontender, Nondistended, +bowel sounds. Ext: Good pulses. Trace edema.   Lab Results:  [facility] 06/11/11 0610  NA 142  K 3.7  CL 106  CO2 28  GLUCOSE 91  BUN 17  CREATININE 1.08  CALCIUM 8.9  MG --  PHOS --   No results found for this basename: AST:2,ALT:2,ALKPHOS:2,BILITOT:2,PROT:2,ALBUMIN:2 in the last 72 hours No results found for this basename: LIPASE:2,AMYLASE:2 in the last 72 hours  Basename 06/11/11 0610  WBC 5.7  NEUTROABS --  HGB 15.3  HCT 45.1  MCV 93.4  PLT 134*    Basename 06/08/11 2116 06/08/11 1228  CKTOTAL 64 84  CKMB 2.2 2.6  CKMBINDEX -- --  TROPONINI <0.30 <0.30   No components found with this basename: POCBNP:3 No results found for this basename: DDIMER:2 in the last 72 hours No results found for this basename: HGBA1C:2 in the last 72 hours No results found for this basename: CHOL:2,HDL:2,LDLCALC:2,TRIG:2,CHOLHDL:2,LDLDIRECT:2 in the last 72 hours No results found for this basename: TSH,T4TOTAL,FREET3,T3FREE,THYROIDAB in the last 72 hours No results found for this basename: VITAMINB12:2,FOLATE:2,FERRITIN:2,TIBC:2,IRON:2,RETICCTPCT:2 in the last 72 hours  Micro Results: Recent Results (from the past 240 hour(s))  URINE CULTURE     Status: Normal   Collection Time   06/07/11  5:01 PM      Component Value Range Status Comment   Specimen Description URINE, RANDOM   Final    Special Requests NONE   Final    Culture  Setup Time [id number]   Final    Colony Count 10,000 COLONIES/ML   Final    Culture KLEBSIELLA PNEUMONIAE   Final    Report Status 06/09/2011 FINAL   Final    Organism ID, Bacteria KLEBSIELLA PNEUMONIAE   Final     Studies/Results: Ct Head Wo Contrast  06/09/2011  *RADIOLOGY REPORT*  Clinical Data: Evaluate for stroke.  Complete memory loss for 2 days.  CT HEAD WITHOUT CONTRAST  Technique:  Contiguous axial images were obtained from the base of the skull through the vertex without contrast.  Comparison: Head CT 06/07/2011  Findings: No acute intracranial abnormality is identified.  The cerebral volume is stable.  Ventricles are normal and stable in size.  Negative for hemorrhage, hydrocephalus, mass effect, mass lesion, or evidence of acute cortically based infarction. Atherosclerotic calcification of the intracranial internal carotid arteries noted.  Left stapes implant again noted.  The paranasal sinuses and mastoid air cells are clear.  The skull is intact. Soft tissues of the scalp are symmetric.  IMPRESSION: Stable exam.  No acute intracranial abnormality.  Original Report Authenticated By: Britta Mccreedy, M.D.    Medications: I have reviewed the patient's current medications. Scheduled Meds:    . carbidopa-levodopa  1 tablet Oral  TID  . cefTRIAXone (ROCEPHIN)  IV  1 g Intravenous Q24H  . citalopram  20 mg Oral Daily  . digoxin  0.25 mg Oral Daily  . docusate sodium  100 mg Oral BID  . fludrocortisone  0.1 mg Oral Daily  . miconazole   Topical BID  . rOPINIRole  0.5 mg Oral Daily  . senna  1 tablet Oral BID  . sodium chloride  3 mL Intravenous Q12H  . warfarin  5 mg Oral Q M,W,F-1800  . warfarin  7.5 mg Oral Custom  . Warfarin - Pharmacist Dosing Inpatient   Does not apply q1800  . DISCONTD: carbidopa-levodopa  1  tablet Oral QID  . DISCONTD: rOPINIRole  1-2 mg Oral Daily  . DISCONTD: selegiline  5 mg Oral Daily   Continuous Infusions:  PRN Meds:.acetaminophen, acetaminophen, ondansetron (ZOFRAN) IV, ondansetron  Assessment/Plan: 1. Altered mental status: Overall this seems like delirium on Parkinson's, however per report  the Parkinson's is mild and usually his mental status is appropriate. W/u so far is unrevealing- repeat CT scan as patient unable to have MRI  Did not show stroke.  Although family reports he's been this way with UTI previously, his UA is very unimpressive, UCx shows klebsiella. Medication effect (sinemet, celexa, chlorpheniramine/anticholinergic, requip all listed on med list) v Patient denies any new medications-  Per old office notes there have been issues with his medication doses- ? Taking too much? Family concerned about seizure- doubtful this is the case- request Neuro consult (called into Dr. Bettey Costa) Seen by psychiatry- restart celexa Spoke with Dr. Sandria Manly: D/C selegiline and bring carbidopa-levodopa to [facility], wean off requip (he started this process in December)   2. AFib: Continue home coumadin and digoxin, trend daily INR   3. Elevated Tbili: Pt with Gilbert's noted on problem list.   4. Continue home fludocortisone  5. ? UTI- await culture- start rocephin day #4/5- klebsiella (small growth < 10,000)  PT to eval- does well physically Back to SNF   LOS: 4 days  Samar Venneman, DO 06/11/2011, 10:27 AM

## 2011-06-11 NOTE — Consult Note (Signed)
Urology Consult  Referring physician: [facility] Service Reason for referral: retracted foreskin  Chief Complaint: painful penis  History of Present Illness: 78 yo male admitted with altered mental status. While  being bathed today, penile foreskin was retracted to clean penis, but may not have been brought over glans afterward.Pt now has penile edema, and painful glans. Unsuccessful attempt by HS to reduce glans.    Past Medical History  Diagnosis Date  . Parkinson's disease     Dr Avie Echevaria  . Hypertension   . Hypothyroidism     Caused by Amiodarone, resolved  . Orthostatic hypotension     Chronic  . Atrial fibrillation   . Chronic anticoagulation   . BPH (benign prostatic hyperplasia)   . Cancer     hx of skin cancer;? basal cell  . Gastritis   . Shortness of breath   . Pneumonia     hx of   . Blood dyscrasia     chronic anticoagulation   Past Surgical History  Procedure Date  . Hernia repair      X 4 total  . Appendectomy Age 58  . Knee surgery Age 44  . US echocardiography July 2012    EF 55-60%  . Cardiovascular stress test 09-04-2003    EF 66%  . Middle ear surgery     Medications: I have reviewed the patient's current medications. Allergies:  Allergies  Allergen Reactions  . Sulfonamide Derivatives Nausea Only    1957 post knee surgery    Family History  Problem Relation Age of Onset  . Dementia Mother   . Heart disease Father     CHF  . Heart disease Paternal Grandfather     MI  . Cancer Brother     LUNG  . Heart disease Brother     BY-PASS   Social History:  reports that he has never smoked. He has never used smokeless tobacco. He reports that he drinks alcohol. He reports that he does not use illicit drugs.  ROS: All systems are reviewed and negative except as noted.   Physical Exam:  Vital signs in last 24 hours: Temp:  [97.5 F (36.4 C)-98 F (36.7 C)] 97.6 F (36.4 C) (05/04 0000) Pulse Rate:  [66-77] 66  (05/04 0000) Resp:  [16-18]  18  (05/04 0000) BP: (105-150)/(70-93) 113/77 mmHg (05/04 0000) SpO2:  [93 %-99 %] 99 % (05/04 0000)  Cardiovascular: Skin warm; not flushed Respiratory: Breaths quiet; no shortness of breath Abdomen: No masses Neurological: Normal sensation to touch Musculoskeletal: Normal motor function arms and legs Lymphatics: No inguinal adenopathy Skin: No rashes Genitourinary: penile glans edema, tender, and foreskin tight around the corona of the glans.   Laboratory Data:  Results for orders placed during the hospital encounter of 06/07/11 (from the past 72 hour(s))  COMPREHENSIVE METABOLIC PANEL     Status: Abnormal   Collection Time   06/08/11  5:20 AM      Component Value Range Comment   Sodium 144  135 - 145 (mEq/L)    Potassium 4.1  3.5 - 5.1 (mEq/L)    Chloride 108  96 - 112 (mEq/L)    CO2 29  19 - 32 (mEq/L)    Glucose, Bld 93  70 - 99 (mg/dL)    BUN 14  6 - 23 (mg/dL)    Creatinine, Ser 9.60  0.50 - 1.35 (mg/dL)    Calcium 8.8  8.4 - 10.5 (mg/dL)    Total Protein 5.8 (*)  6.0 - 8.3 (g/dL)    Albumin 3.5  3.5 - 5.2 (g/dL)    AST 15  0 - 37 (U/L)    ALT 11  0 - 53 (U/L)    Alkaline Phosphatase 55  39 - 117 (U/L)    Total Bilirubin 2.3 (*) 0.3 - 1.2 (mg/dL)    GFR calc non Af Amer 55 (*) >90 (mL/min)    GFR calc Af Amer 63 (*) >90 (mL/min)   CBC     Status: Abnormal   Collection Time   06/08/11  5:20 AM      Component Value Range Comment   WBC 5.2  4.0 - 10.5 (K/uL)    RBC 4.42  4.22 - 5.81 (MIL/uL)    Hemoglobin 14.0  13.0 - 17.0 (g/dL)    HCT 30.8  65.7 - 84.6 (%)    MCV 93.9  78.0 - 100.0 (fL)    MCH 31.7  26.0 - 34.0 (pg)    MCHC 33.7  30.0 - 36.0 (g/dL)    RDW 96.2  95.2 - 84.1 (%)    Platelets 135 (*) 150 - 400 (K/uL)   CARDIAC PANEL(CRET KIN+CKTOT+MB+TROPI)     Status: Normal   Collection Time   06/08/11  5:20 AM      Component Value Range Comment   Total CK 79  7 - 232 (U/L)    CK, MB 2.7  0.3 - 4.0 (ng/mL)    Troponin I <0.30  <0.30 (ng/mL)    Relative Index  RELATIVE INDEX IS INVALID  0.0 - 2.5    BLOOD GAS, ARTERIAL     Status: Abnormal   Collection Time   06/08/11  6:55 AM      Component Value Range Comment   Delivery systems ROOM AIR      pH, Arterial 7.436  7.350 - 7.450     pCO2 arterial 40.7  35.0 - 45.0 (mmHg)    pO2, Arterial 89.9  80.0 - 100.0 (mmHg)    Bicarbonate 26.9 (*) 20.0 - 24.0 (mEq/L)    TCO2 28.2  0 - 100 (mmol/L)    Acid-Base Excess 3.0 (*) 0.0 - 2.0 (mmol/L)    O2 Saturation 97.5      Patient temperature 98.6      Collection site RIGHT BRACHIAL      Drawn by [id number]      Sample type ARTERIAL      Allens test (pass/fail) NOT INDICATED (*) PASS    CARDIAC PANEL(CRET KIN+CKTOT+MB+TROPI)     Status: Normal   Collection Time   06/08/11 12:28 PM      Component Value Range Comment   Total CK 84  7 - 232 (U/L)    CK, MB 2.6  0.3 - 4.0 (ng/mL)    Troponin I <0.30  <0.30 (ng/mL)    Relative Index RELATIVE INDEX IS INVALID  0.0 - 2.5    CARDIAC PANEL(CRET KIN+CKTOT+MB+TROPI)     Status: Normal   Collection Time   06/08/11  9:16 PM      Component Value Range Comment   Total CK 64  7 - 232 (U/L)    CK, MB 2.2  0.3 - 4.0 (ng/mL)    Troponin I <0.30  <0.30 (ng/mL)    Relative Index RELATIVE INDEX IS INVALID  0.0 - 2.5    GLUCOSE, CAPILLARY     Status: Abnormal   Collection Time   06/08/11 11:58 PM      Component Value Range Comment  Glucose-Capillary 116 (*) 70 - 99 (mg/dL)    Comment 1 Notify RN     PROTIME-INR     Status: Abnormal   Collection Time   06/09/11  5:20 AM      Component Value Range Comment   Prothrombin Time 20.2 (*) 11.6 - 15.2 (seconds)    INR 1.69 (*) 0.00 - 1.49    GLUCOSE, CAPILLARY     Status: Normal   Collection Time   06/09/11  7:48 AM      Component Value Range Comment   Glucose-Capillary 90  70 - 99 (mg/dL)    Comment 1 Notify RN     GLUCOSE, CAPILLARY     Status: Normal   Collection Time   06/09/11  4:34 PM      Component Value Range Comment   Glucose-Capillary 79  70 - 99 (mg/dL)    Comment 1  Notify RN     GLUCOSE, CAPILLARY     Status: Normal   Collection Time   06/09/11 11:50 PM      Component Value Range Comment   Glucose-Capillary 98  70 - 99 (mg/dL)    Comment 1 Notify RN     PROTIME-INR     Status: Abnormal   Collection Time   06/10/11  5:28 AM      Component Value Range Comment   Prothrombin Time 23.0 (*) 11.6 - 15.2 (seconds)    INR 2.00 (*) 0.00 - 1.49    GLUCOSE, CAPILLARY     Status: Abnormal   Collection Time   06/10/11  7:40 AM      Component Value Range Comment   Glucose-Capillary 104 (*) 70 - 99 (mg/dL)    Comment 1 Notify RN     GLUCOSE, CAPILLARY     Status: Abnormal   Collection Time   06/10/11  4:34 PM      Component Value Range Comment   Glucose-Capillary 104 (*) 70 - 99 (mg/dL)    Recent Results (from the past 240 hour(s))  URINE CULTURE     Status: Normal   Collection Time   06/07/11  5:01 PM      Component Value Range Status Comment   Specimen Description URINE, RANDOM   Final    Special Requests NONE   Final    Culture  Setup Time [id number]   Final    Colony Count 10,000 COLONIES/ML   Final    Culture KLEBSIELLA PNEUMONIAE   Final    Report Status 06/09/2011 FINAL   Final    Organism ID, Bacteria KLEBSIELLA PNEUMONIAE   Final    Creatinine:  Basename 06/08/11 0520 06/07/11 1700  CREATININE 1.22 1.20    Xrays: See report/chart   Impression/Assessment:  Retracted foreskin: reduced at pt's bedside. Discussed with pt's nurse.   Plan:  Observe. Keep foreskin over glans.   Mardee Clune I 06/11/2011, 3:39 AM

## 2011-06-11 NOTE — Progress Notes (Signed)
ANTICOAGULATION CONSULT NOTE - Follow Up Consult  Pharmacy Consult for Coumadin Indication: atrial fibrillation  Allergies  Allergen Reactions  . Sulfonamide Derivatives Nausea Only    1955 post knee surgery    Patient Measurements: Height: 6' (182.9 cm) Weight: 168 lb 6.9 oz (76.4 kg) IBW/kg (Calculated) : 77.6   Vital Signs: Temp: 97.9 F (36.6 C) (05/04 0400) Temp src: Axillary (05/04 0400) BP: 122/87 mmHg (05/04 0400) Pulse Rate: 73  (05/04 0400)  Labs:  Carlos Gonzalez 06/11/11 0610 06/10/11 0528 06/09/11 0520 06/08/11 2116 06/08/11 1228  HGB 15.3 -- -- -- --  HCT 45.1 -- -- -- --  PLT 134* -- -- -- --  APTT -- -- -- -- --  LABPROT 26.2* 23.0* 20.2* -- --  INR 2.36* 2.00* 1.69* -- --  HEPARINUNFRC -- -- -- -- --  CREATININE 1.08 -- -- -- --  CKTOTAL -- -- -- 64 84  CKMB -- -- -- 2.2 2.6  TROPONINI -- -- -- <0.30 <0.30   Estimated Creatinine Clearance: 59.9 ml/min (by C-G formula based on Cr of 1.08).  Assessment: 78yo male with AFib CVR on digoxin last level 4/30 ok at 0.6.  INR 2.36 at goal 2-3 with some boost doses Coumadin 10mg  after missing 4/30 dose .  No bleeding issues noted, CBC stable. UTI Kleb sensitive to Ceftriaxone - on Day 4 Ceftriaxone - afebrile, WBC WNL  Goal of Therapy:  INR 2-3   Plan:  1.  Restart Coumadin 5mg  MWF and 7.5mg  TTSS - home dose 2.  Daily INR

## 2011-06-12 DIAGNOSIS — R4182 Altered mental status, unspecified: Secondary | ICD-10-CM

## 2011-06-12 DIAGNOSIS — I4891 Unspecified atrial fibrillation: Secondary | ICD-10-CM

## 2011-06-12 DIAGNOSIS — G20 Parkinson's disease: Secondary | ICD-10-CM

## 2011-06-12 LAB — GLUCOSE, CAPILLARY
Glucose-Capillary: 103 mg/dL — ABNORMAL HIGH (ref 70–99)
Glucose-Capillary: 105 mg/dL — ABNORMAL HIGH (ref 70–99)

## 2011-06-12 MED ORDER — WARFARIN SODIUM 2.5 MG PO TABS
2.5000 mg | ORAL_TABLET | Freq: Once | ORAL | Status: DC
  Filled 2011-06-12: qty 1

## 2011-06-12 MED ORDER — WARFARIN SODIUM 7.5 MG PO TABS: 7.5000 mg | ORAL_TABLET | ORAL | Status: DC

## 2011-06-12 MED ORDER — ROPINIROLE HCL 0.5 MG PO TABS: 0.5000 mg | ORAL_TABLET | Freq: Every day | ORAL | Status: DC

## 2011-06-12 MED ORDER — CARBIDOPA-LEVODOPA 25-100 MG PO TABS: 1.0000 | ORAL_TABLET | Freq: Three times a day (TID) | ORAL | Status: DC

## 2011-06-12 NOTE — Progress Notes (Signed)
ANTICOAGULATION CONSULT NOTE - Follow Up Consult  Pharmacy Consult for Coumadin Indication: atrial fibrillation  Allergies  Allergen Reactions  . Sulfonamide Derivatives Nausea Only    1955 post knee surgery    Patient Measurements: Height: 6' (182.9 cm) Weight: 168 lb 6.9 oz (76.4 kg) IBW/kg (Calculated) : 77.6   Vital Signs: Temp: 97.4 F (36.3 C) (05/05 0500) Temp src: Oral (05/05 0500) BP: 95/60 mmHg (05/05 0500) Pulse Rate: 69  (05/05 0500)  Labs:  Basename 06/12/11 0515 06/11/11 0610 06/10/11 0528  HGB -- 15.3 --  HCT -- 45.1 --  PLT -- 134* --  APTT -- -- --  LABPROT 29.0* 26.2* 23.0*  INR 2.69* 2.36* 2.00*  HEPARINUNFRC -- -- --  CREATININE -- 1.08 --  CKTOTAL -- -- --  CKMB -- -- --  TROPONINI -- -- --   Estimated Creatinine Clearance: 59.9 ml/min (by C-G formula based on Cr of 1.08).  Assessment: 78yo male with AFib CVR on digoxin last level 4/30 ok at 0.6.  INR 2.36 at goal 2-3 with some boost doses Coumadin 10mg  after missing 4/30 dose .  No bleeding issues noted, CBC stable. UTI Kleb sensitive to Ceftriaxone - on Day 5/5-per MD note yesterday -f/u d/c- Ceftriaxone - afebrile, WBC WNL  Goal of Therapy:  INR 2-3   Plan:  1. Give 2Coumadin 2.5mg  x1 today then continue Coumadin 5mg  MWF and 7.5mg  TTSS - home dose 2.  Daily INR

## 2011-06-12 NOTE — Discharge Summary (Addendum)
Discharge Summary  Carlos Gonzalez MR#: [id number]  DOB:Sep 13, 1930  Date of Admission: 06/07/2011 Date of Discharge: 06/12/2011  Patient's PCP: Marga Melnick, MD, MD  Attending Physician:Ahri Olson  Consults: Treatment Team:  Hayden Rasmussen, MD Dr. Ferol Luz- Psychiatry   Discharge Diagnoses: Principal Problem:  *Altered mental status Active Problems:  PARKINSON'S DISEASE  Atrial fibrillation   Brief Admitting History and Physical 78yoM with h/o Parkinson's disease, AFib/coumadin, amiodarone induced hypothyroidism  transferred from [facility] for altered mental status.  Pt cannot give history, he appears quite delirious at present and son who was previously here is  now gone. So, history gathered from ED notes and from nursing staff. Apprently, pt lives  independently at an ALF and despite some hallucinations associated with Parkinson's, is still high  functioning, ambulatory, able to manage his own meds. Review of some EPIC notes shows he is an  "excellent historian." Around lunch today his family noted his speech was very slow, was more  disoriented. He's acted this way previously with UTI's. He had some nausea and emesis 2d ago  and endorsed some fatigue.  Vitals so far stable except temp 96.9, now normal, and mild HTN. Labs with normal chem panel,  renal 17/1.2, normal LFT's except Tbili 1.7. Trop negative. Normal CBC. INR 1.9. UA negative. CT  head stable and negative for acute process. CXR with emphysema but nothing acute. ECG not  ischemic. Digoxin 0.6.  At present, pt can state he's having no chest pain, SOB, abd pain. He wants to get up to pee, but is  repeatedly told he has a condom cath on.    Discharge Medications Medication List  As of 06/12/2011 10:51 AM   STOP taking these medications         chlorpheniramine 4 MG tablet      selegiline 5 MG tablet         TAKE these medications         carbidopa-levodopa 25-100 MG per tablet   Commonly known as: SINEMET  IR   Take 1 tablet by mouth 3 (three) times daily. 8am,11am,4pm      citalopram 20 MG tablet   Commonly known as: CELEXA   Take 20 mg by mouth daily.      digoxin 0.25 MG tablet   Commonly known as: LANOXIN   TAKE 1/2 TABLET EVERY DAY      fludrocortisone 0.1 MG tablet   Commonly known as: FLORINEF   Take 0.1 mg by mouth daily.      rOPINIRole 0.5 MG tablet   Commonly known as: REQUIP   Take 1 tablet (0.5 mg total) by mouth daily. Goal is to wean down to d/C      warfarin 5 MG tablet   Commonly known as: COUMADIN   Take 5-7.5 mg by mouth daily. 5 mg on Monday, Wednesday, and Friday; and 7.5 mg on Sunday, Tuesday, Thursday, and Saturday            Hospital Course: 1. Altered mental status: Overall this seems like delirium on Parkinson's, however per report the Parkinson's is mild and usually his mental status is appropriate. W/u so far is unrevealing- repeat CT scan as patient unable to have MRI Did not show stroke. Although family reports he's been this way with UTI previously, his UA is very unimpressive, UCx shows klebsiella and he was treated for this. ?Medication effect (sinemet, celexa, chlorpheniramine/anticholinergic, requip all listed on med list) v Patient denies any new medications- Per old office notes  there have been issues with his medication doses- ? Taking too much?  Family concerned about seizure- doubtful this is the case- request Neuro consult (called into Dr. Bettey Costa) -  NEUROLOGY: Parkinson's Disease associated with mild dementia, presumed orthostasis and intermittent hallucinations reported as medication side effect. The coarse jerks described were not likely to be epileptic. This was discussed with his daughter and the patient. His mental status appears baseline at present as I understand it. He has regular follow-up with Dr. Sandria Manly. I discussed that dementia and depression may be part of Parkinsons. I doubt that his presentation was primarily neurological in  nature.   Seen by psychiatry- recommended restarting celexa  Spoke with Dr. Sandria Manly: D/C selegiline and bring carbidopa-levodopa to TID, wean off requip (he started this process in December)   2. AFib: Continue home coumadin and digoxin, trend daily INR  3. Elevated Tbili: Pt with Gilbert's noted on problem list.  4. ? UTI- await culture- start rocephin day #4/5- klebsiella (small growth < 10,000) 5. Did well with PT 6. Urology saw patient for foreskin retractation reduced with out complications    Day of Discharge BP 95/60  Pulse 69  Temp(Src) 97.4 F (36.3 C) (Oral)  Resp 18  Ht 6' (1.829 m)  Wt 76.4 kg (168 lb 6.9 oz)  BMI 22.84 kg/m2  SpO2 98% Up reading newspaper -c/c/e +BS, soft, NT/ND RRR   Results for orders placed during the hospital encounter of 06/07/11 (from the past 48 hour(s))  GLUCOSE, CAPILLARY     Status: Abnormal   Collection Time   06/10/11  4:34 PM      Component Value Range Comment   Glucose-Capillary 104 (*) 70 - 99 (mg/dL)   GLUCOSE, CAPILLARY     Status: Normal   Collection Time   06/11/11  5:14 AM      Component Value Range Comment   Glucose-Capillary 78  70 - 99 (mg/dL)   PROTIME-INR     Status: Abnormal   Collection Time   06/11/11  6:10 AM      Component Value Range Comment   Prothrombin Time 26.2 (*) 11.6 - 15.2 (seconds)    INR 2.36 (*) 0.00 - 1.49    CBC     Status: Abnormal   Collection Time   06/11/11  6:10 AM      Component Value Range Comment   WBC 5.7  4.0 - 10.5 (K/uL)    RBC 4.83  4.22 - 5.81 (MIL/uL)    Hemoglobin 15.3  13.0 - 17.0 (g/dL)    HCT 16.1  09.6 - 04.5 (%)    MCV 93.4  78.0 - 100.0 (fL)    MCH 31.7  26.0 - 34.0 (pg)    MCHC 33.9  30.0 - 36.0 (g/dL)    RDW 40.9  81.1 - 91.4 (%)    Platelets 134 (*) 150 - 400 (K/uL)   BASIC METABOLIC PANEL     Status: Abnormal   Collection Time   06/11/11  6:10 AM      Component Value Range Comment   Sodium 142  135 - 145 (mEq/L)    Potassium 3.7  3.5 - 5.1 (mEq/L)    Chloride 106  96  - 112 (mEq/L)    CO2 28  19 - 32 (mEq/L)    Glucose, Bld 91  70 - 99 (mg/dL)    BUN 17  6 - 23 (mg/dL)    Creatinine, Ser 7.82  0.50 - 1.35 (mg/dL)  Calcium 8.9  8.4 - 10.5 (mg/dL)    GFR calc non Af Amer 63 (*) >90 (mL/min)    GFR calc Af Amer 73 (*) >90 (mL/min)   GLUCOSE, CAPILLARY     Status: Normal   Collection Time   06/11/11  7:37 AM      Component Value Range Comment   Glucose-Capillary 70  70 - 99 (mg/dL)   GLUCOSE, CAPILLARY     Status: Abnormal   Collection Time   06/11/11  9:02 PM      Component Value Range Comment   Glucose-Capillary 125 (*) 70 - 99 (mg/dL)   PROTIME-INR     Status: Abnormal   Collection Time   06/12/11  5:15 AM      Component Value Range Comment   Prothrombin Time 29.0 (*) 11.6 - 15.2 (seconds)    INR 2.69 (*) 0.00 - 1.49    GLUCOSE, CAPILLARY     Status: Abnormal   Collection Time   06/12/11  7:38 AM      Component Value Range Comment   Glucose-Capillary 103 (*) 70 - 99 (mg/dL)     Dg Chest 2 View  05/16/8117  *RADIOLOGY REPORT*  Clinical Data: Altered mental status, weakness, history hypertension, Parkinson's, atrial fibrillation, skin cancer  CHEST - 2 VIEW  Comparison: 08/14/2010  Findings: Normal heart size, mediastinal contours, and pulmonary vascularity. Atherosclerotic calcification aortic arch. Probable bilateral nipple shadows, appear to be present on prior studies. No acute infiltrate, pleural effusion or pneumothorax. Underlying emphysematous changes. No acute osseous findings.  IMPRESSION: Emphysematous changes. No acute abnormalities.  Original Report Authenticated By: Lollie Marrow, M.D.   Ct Head Wo Contrast  06/09/2011  *RADIOLOGY REPORT*  Clinical Data: Evaluate for stroke.  Complete memory loss for 2 days.  CT HEAD WITHOUT CONTRAST  Technique:  Contiguous axial images were obtained from the base of the skull through the vertex without contrast.  Comparison: Head CT 06/07/2011  Findings: No acute intracranial abnormality is identified.  The  cerebral volume is stable.  Ventricles are normal and stable in size.  Negative for hemorrhage, hydrocephalus, mass effect, mass lesion, or evidence of acute cortically based infarction. Atherosclerotic calcification of the intracranial internal carotid arteries noted.  Left stapes implant again noted.  The paranasal sinuses and mastoid air cells are clear.  The skull is intact. Soft tissues of the scalp are symmetric.  IMPRESSION: Stable exam.  No acute intracranial abnormality.  Original Report Authenticated By: Britta Mccreedy, M.D.   Ct Head Wo Contrast  06/07/2011  *RADIOLOGY REPORT*  Clinical Data: 78 year old male with altered mental status, weakness.  CT HEAD WITHOUT CONTRAST  Technique:  Contiguous axial images were obtained from the base of the skull through the vertex without contrast.  Comparison: 08/14/2010.  Findings: Visualized paranasal sinuses and mastoids are clear. Chronic left stapes implant. No acute osseous abnormality identified.  Visualized orbits and scalp soft tissues are within normal limits.  Stable cerebral volume.  No ventriculomegaly.  Mild Calcified atherosclerosis at the skull base.  No midline shift, mass effect, or evidence of mass lesion.  No evidence of cortically based acute infarction identified.  No acute intracranial hemorrhage identified.  Gray-white matter differentiation is within normal limits throughout the brain.  No suspicious intracranial vascular hyperdensity.  IMPRESSION: Stable and negative noncontrast CT appearance of the brain for age.  Original Report Authenticated By: Harley Hallmark, M.D.     Disposition: home (recommeded 24 hour care while he gets settled back  in) Home health ordered  Diet: cardiac  Activity: as tolerated   Follow-up Appts: PCP: 1-2 weeks Dr. Sandria Manly 1-2 weeks  Discharge Orders    Future Appointments: Provider: Department: Dept Phone: Center:   06/16/2011 4:00 PM [facility] [phone] None      Future Orders Please Complete By Expires   Diet - low sodium heart healthy      Increase activity slowly      Discharge instructions      Comments:   INR on Tuesday       Time spent on discharge, talking to the patient, and coordinating care:  45 mins.   SignedMarlin Canary, DO 06/12/2011, 10:51 AM

## 2011-06-13 NOTE — Progress Notes (Addendum)
   CARE MANAGEMENT NOTE 06/13/2011  Patient:  Carlos, Gonzalez   Account Number:  [id number]  Date Initiated:  06/08/2011  Documentation initiated by:  MAYO,HENRIETTA  Subjective/Objective Assessment:   78 yr-old male adm with AMS; lives alone in [facility] apt.     Action/Plan:   Anticipated DC Date:  06/12/2011   Anticipated DC Plan:  HOME W HOME HEALTH SERVICES  In-house referral  Clinical Social Worker      DC Planning Services  CM consult      [facility] Choice  HOME HEALTH   Choice offered to / List presented to:  C-4 Adult Children           Status of service:  Completed, signed off Medicare Important Message given?   (If response is "NO", the following Medicare IM given date fields will be blank) Date Medicare IM given:   Date Additional Medicare IM given:    Discharge Disposition:  HOME W HOME HEALTH SERVICES  Per UR Regulation:    If discussed at Long Length of Stay Meetings, dates discussed:    Comments:  06/13/2011 1100 Spoke to [place], at [facility]. States they do offer HH PT and Speech Therapy. Requested order to be faxed to [phone]. Faxed orders, facesheet and d/c summary to [facility]. Contacted daughter to make aware. NCM spoke to Darl Pikes to inform Hass[facility] Neth will do therapy. States they have a sitter for the father at night and he did well last night. Isidoro Donning RN CCM Case Mgmt phone [phone]  06/12/2011 1500 Pt gave permission to speak with daughter, Carlos Gonzalez [id number]#[phone], and [phone]. States pt has shower stool at apt. Explained NCM will contact Carlos Gonzalez on 5/6 to verify they offer Speech Therapy and PT. Will follow up with daughter after services set up. Would like to use [facility] for Carlos Gonzalez if facility does not have HH. Isidoro Donning RN CCM Case Mgmt phone [phone]  PCP:  Dr. Marga Melnick  Contact:  Bayler Gehrig, son [phone]  5/3 12:24p debbie dowell rn,bsn pt confused during nite. md to get psych consult. sw  following in case higher level of care needed.

## 2011-06-22 ENCOUNTER — Other Ambulatory Visit: Payer: Self-pay | Admitting: Cardiology

## 2011-08-16 ENCOUNTER — Ambulatory Visit (INDEPENDENT_AMBULATORY_CARE_PROVIDER_SITE_OTHER): Payer: Medicare Other | Admitting: Cardiology

## 2011-08-16 ENCOUNTER — Encounter: Payer: Self-pay | Admitting: Cardiology

## 2011-08-16 VITALS — BP 131/85 | HR 80 | Ht 72.0 in | Wt 174.0 lb

## 2011-08-16 DIAGNOSIS — E039 Hypothyroidism, unspecified: Secondary | ICD-10-CM

## 2011-08-16 DIAGNOSIS — I4891 Unspecified atrial fibrillation: Secondary | ICD-10-CM

## 2011-08-16 DIAGNOSIS — R42 Dizziness and giddiness: Secondary | ICD-10-CM

## 2011-08-16 DIAGNOSIS — G20A1 Parkinson's disease without dyskinesia, without mention of fluctuations: Secondary | ICD-10-CM

## 2011-08-16 DIAGNOSIS — G20 Parkinson's disease: Secondary | ICD-10-CM

## 2011-08-16 NOTE — Assessment & Plan Note (Signed)
The patient's dizziness has improved.  He remains on Florinef 0.1 mg daily.

## 2011-08-16 NOTE — Patient Instructions (Addendum)
Your physician recommends that you continue on your current medications as directed. Please refer to the Current Medication list given to you today.  Your physician recommends that you schedule a follow-up appointment in: 3 month ov/ekg/tsh  NEEDS PROTIME THIS WEEK

## 2011-08-16 NOTE — Progress Notes (Signed)
Carlos Gonzalez Date of Birth:  02-Jun-1930 [facility] [address] [place], [place]  [address] [phone]         Fax   [phone]  History of Present Illness: This pleasant 78 year old gentleman is seen for a scheduled followup office visit.  He has a history of established atrial fibrillation.  He has parkinsonism.  He has also had some problems with decreased memory.  He also has what he describes as bilateral inguinal hernias for which she will be seeking consultation with Dr. Alwyn Ren and perhaps the surgeon.  The patient is on long-term Coumadin because of his atrial fibrillation.  The patient has a history of hypothyroidism and his TSH levels off Synthroid have remained normal most recently on 04/14/11 when his TSH was 3.26 Current Outpatient Prescriptions  Medication Sig Dispense Refill  . carbidopa-levodopa (SINEMET IR) 25-100 MG per tablet Take 1 tablet by mouth 3 (three) times daily. 8am,11am,4pm  90 tablet  0  . citalopram (CELEXA) 20 MG tablet Take 20 mg by mouth daily.      . digoxin (LANOXIN) 0.25 MG tablet TAKE 1/2 TABLET EVERY DAY  100 tablet  3  . fludrocortisone (FLORINEF) 0.1 MG tablet Take 0.1 mg by mouth daily.      Marland Kitchen warfarin (COUMADIN) 5 MG tablet Take 5-7.5 mg by mouth daily. 5 mg on Monday, Wednesday, and Friday; and 7.5 mg on Sunday, Tuesday, Thursday, and Saturday        Allergies  Allergen Reactions  . Sulfonamide Derivatives Nausea Only    1957 post knee surgery    Patient Active Problem List  Diagnosis  . HYPOTHYROIDISM  . HYPERKALEMIA  . GILBERT'S SYNDROME  . COAGULOPATHY, COUMADIN-INDUCED  . PARKINSON'S DISEASE  . ATRIAL FIBRILLATION  . ALLERGIC RHINITIS CAUSE UNSPECIFIED  . PNEUMONIA, RIGHT LOWER LOBE  . STRICTURE AND STENOSIS OF ESOPHAGUS  . BENIGN PROSTATIC HYPERTROPHY  . DIZZINESS  . SLEEP DISORDER  . DYSPHAGIA UNSPECIFIED  . Nonspecific (abnormal) findings on radiological and other examination of body structure    . UNS ADVRS EFF UNS RX MEDICINAL&BIOLOGICAL SBSTNC  . SKIN CANCER, HX OF  . Personal history of colonic polyps  . BENIGN PROSTATIC HYPERTROPHY, HX OF  . CHEST XRAY, ABNORMAL  . Orthostatic hypotension  . Altered mental status  . Atrial fibrillation    History  Smoking status  . Never Smoker   Smokeless tobacco  . Never Used    History  Alcohol Use  . Yes    Socially 3-4 oz / week    Family History  Problem Relation Age of Onset  . Dementia Mother   . Heart disease Father     CHF  . Heart disease Paternal Grandfather     MI  . Cancer Brother     LUNG  . Heart disease Brother     BY-PASS    Review of Systems: Constitutional: no fever chills diaphoresis or fatigue or change in weight.  Head and neck: no hearing loss, no epistaxis, no photophobia or visual disturbance. Respiratory: No cough, shortness of breath or wheezing. Cardiovascular: No chest pain peripheral edema, palpitations. Gastrointestinal: No abdominal distention, no abdominal pain, no change in bowel habits hematochezia or melena. Genitourinary: No dysuria, no frequency, no urgency, no nocturia. Musculoskeletal:No arthralgias, no back pain, no gait disturbance or myalgias. Neurological: No dizziness, no headaches, no numbness, no seizures, no syncope, no weakness, no tremors. Hematologic: No lymphadenopathy, no easy bruising. Psychiatric: No confusion, no hallucinations, no  sleep disturbance.    Physical Exam: Filed Vitals:   08/16/11 1021  BP: 131/85  Pulse: 80   the general appearance reveals a thin gentleman in no acute distress.The head and neck exam reveals pupils equal and reactive.  Extraocular movements are full.  There is no scleral icterus.  The mouth and pharynx are normal.  The neck is supple.  The carotids reveal no bruits.  The jugular venous pressure is normal.  The  thyroid is not enlarged.  There is no lymphadenopathy.  The chest is clear to percussion and auscultation.  There are no  rales or rhonchi.  Expansion of the chest is symmetrical.  The precordium is quiet.  The first heart sound is normal.  The second heart sound is physiologically split.  There is no murmur gallop rub or click.  There is no abnormal lift or heave.  The abdomen is soft and nontender.  The bowel sounds are normal.  The liver and spleen are not enlarged.  There are no abdominal masses.  There are no abdominal bruits.  Extremities reveal good pedal pulses.  There is no phlebitis or edema.  There is no cyanosis or clubbing.  Strength is normal and symmetrical in all extremities.  He does have a resting tremor.  There is no lateralizing weakness.  There are no sensory deficits.  The skin is warm and dry.  There is a skin rash consistent with seborrhea most prominent on the face.  He is under the care of dermatology for this     Assessment / Plan: The patient is to continue same medication.  Recheck in 3 months for followup office visit EKG and TSH.  He also told me at the end of the exam that he was under more stress.  His wife has filed for divorce.  The patient is presently living at [facility] [address] independently.  Home instead comes in daily to make sure that he takes his medication he has been using a stationary bike twice a day and he goes to balance class at the home 3 times a week

## 2011-08-16 NOTE — Assessment & Plan Note (Signed)
The patient was hospitalized on the hospitalist service on 06/07/11 for increased confusion.  This improved after he was taken off his over-the-counter antihistamine and his Eldepryl was also stopped.  Dr. love is his neurologist

## 2011-08-16 NOTE — Assessment & Plan Note (Signed)
Patient is clinically euthyroid.  He appears to be doing well without any exogenous Synthroid at this point

## 2011-08-16 NOTE — Assessment & Plan Note (Signed)
The patient has had no thromboembolic episodes from his atrial fibrillation.  He gets his prothrombin times checked at our Coumadin clinic.  His last one was 2 months ago.

## 2011-08-18 ENCOUNTER — Ambulatory Visit (INDEPENDENT_AMBULATORY_CARE_PROVIDER_SITE_OTHER): Payer: Medicare Other

## 2011-08-18 DIAGNOSIS — I4891 Unspecified atrial fibrillation: Secondary | ICD-10-CM

## 2011-08-18 LAB — POCT INR: INR: 2.3

## 2011-09-02 ENCOUNTER — Encounter: Payer: Self-pay | Admitting: Internal Medicine

## 2011-09-02 ENCOUNTER — Ambulatory Visit (INDEPENDENT_AMBULATORY_CARE_PROVIDER_SITE_OTHER): Payer: Medicare Other | Admitting: Internal Medicine

## 2011-09-02 VITALS — BP 106/70 | HR 77 | Temp 97.4°F | Wt 171.4 lb

## 2011-09-02 DIAGNOSIS — K4090 Unilateral inguinal hernia, without obstruction or gangrene, not specified as recurrent: Secondary | ICD-10-CM

## 2011-09-02 NOTE — Progress Notes (Signed)
  Subjective:    Patient ID: Carlos Gonzalez, male    DOB: [birth year], 78 y.o.   MRN: [id number]  HPI An incidental finding on routine exam approximately 4 years ago was an apparent small hernia on the left. In the last several months he has noted soreness in this area in the mornings. This does improve through the day. The soreness is aggravated by waist flexion.    Review of Systems He denies fever, chills, unexplained weight loss, associated abdominal pain, constipation, diarrhea, melena, or rectal bleeding. He has had some back pain which he does not believe is related to the present complaint. There has been no rash in the area of discomfort     Objective:   Physical Exam General appearance; thin but in good health and nourishment w/o distress. Subtle Parkinsonian facial changes.  Eyes: No conjunctival inflammation or scleral icterus is present.    Heart:  Normal rate and slightly irregular rhythm. S1 and S2 normal without gallop, murmur, click, rub. S4  Lungs:Chest clear to auscultation; no wheezes, rhonchi,rales ,or rubs present.No increased work of breathing.   Abdomen: bowel sounds normal, soft but slight tenderness in the right lower quadrant below the appendix scar.No masses or organomegaly  noted.  No guarding or rebound . There is a small direct hernia in the left lower quadrant which is reducible.  Genitalia normal except for left varices & slight L testicular atrophy  Skin:Warm & dry.  Intact without suspicious lesions or rashes ; no jaundice or tenting  Lymphatic: No lymphadenopathy is noted about the head, neck, axilla, or inguinal areas.   Neuro: intermittent tremor LUE  Musculoskeletal: Negative straight leg raising bilaterally             Assessment & Plan:  #1 small left reducible direct hernia  Plan: Risk and options discussed

## 2011-09-02 NOTE — Patient Instructions (Addendum)
Please report Warning Signs as discussed ; unrelenting pain & swelling.

## 2011-09-15 ENCOUNTER — Ambulatory Visit (INDEPENDENT_AMBULATORY_CARE_PROVIDER_SITE_OTHER): Payer: Medicare Other | Admitting: *Deleted

## 2011-09-15 DIAGNOSIS — I4891 Unspecified atrial fibrillation: Secondary | ICD-10-CM

## 2011-10-03 ENCOUNTER — Other Ambulatory Visit: Payer: Self-pay | Admitting: Pharmacist

## 2011-10-03 MED ORDER — WARFARIN SODIUM 5 MG PO TABS: 5.0000 mg | ORAL_TABLET | Freq: Every day | ORAL | Status: DC

## 2011-10-31 ENCOUNTER — Other Ambulatory Visit: Payer: Self-pay

## 2011-10-31 MED ORDER — CITALOPRAM HYDROBROMIDE 20 MG PO TABS: 20.0000 mg | ORAL_TABLET | Freq: Every day | ORAL | Status: DC

## 2011-11-01 ENCOUNTER — Ambulatory Visit (INDEPENDENT_AMBULATORY_CARE_PROVIDER_SITE_OTHER): Payer: Medicare Other

## 2011-11-01 DIAGNOSIS — I4891 Unspecified atrial fibrillation: Secondary | ICD-10-CM

## 2011-11-09 ENCOUNTER — Ambulatory Visit: Payer: Medicare Other | Admitting: Cardiology

## 2011-11-24 ENCOUNTER — Other Ambulatory Visit: Payer: Self-pay

## 2011-11-24 MED ORDER — WARFARIN SODIUM 5 MG PO TABS: ORAL_TABLET | ORAL | Status: DC

## 2011-11-25 ENCOUNTER — Other Ambulatory Visit: Payer: Self-pay

## 2011-11-25 DIAGNOSIS — I4891 Unspecified atrial fibrillation: Secondary | ICD-10-CM

## 2011-11-25 MED ORDER — DIGOXIN 250 MCG PO TABS: 0.2500 mg | ORAL_TABLET | Freq: Every day | ORAL | Status: DC

## 2011-12-05 ENCOUNTER — Encounter: Payer: Self-pay | Admitting: Internal Medicine

## 2011-12-05 ENCOUNTER — Ambulatory Visit (INDEPENDENT_AMBULATORY_CARE_PROVIDER_SITE_OTHER): Payer: Medicare Other | Admitting: Internal Medicine

## 2011-12-05 VITALS — BP 114/70 | HR 71 | Temp 98.0°F | Wt 175.6 lb

## 2011-12-05 DIAGNOSIS — R109 Unspecified abdominal pain: Secondary | ICD-10-CM

## 2011-12-05 DIAGNOSIS — R319 Hematuria, unspecified: Secondary | ICD-10-CM

## 2011-12-05 DIAGNOSIS — N390 Urinary tract infection, site not specified: Secondary | ICD-10-CM

## 2011-12-05 DIAGNOSIS — I4891 Unspecified atrial fibrillation: Secondary | ICD-10-CM

## 2011-12-05 DIAGNOSIS — Z7901 Long term (current) use of anticoagulants: Secondary | ICD-10-CM

## 2011-12-05 LAB — POCT URINALYSIS DIPSTICK
Glucose, UA: NEGATIVE
Ketones, UA: NEGATIVE
Protein, UA: NEGATIVE

## 2011-12-05 MED ORDER — CIPROFLOXACIN HCL 500 MG PO TABS: 500.0000 mg | ORAL_TABLET | Freq: Two times a day (BID) | ORAL | Status: DC

## 2011-12-05 NOTE — Addendum Note (Signed)
Addended by: Maurice Small on: 12/05/2011 04:45 PM   Modules accepted: Orders

## 2011-12-05 NOTE — Progress Notes (Signed)
  Subjective:    Patient ID: Carlos Gonzalez, male    DOB: 1931/01/12, 78 y.o.   MRN: [id number]  HPI He developed suprapubic constant discomfort 12/03/11 upon awakening . Approximately 2-3 hours later the pain had essentially resolved , but he noticed his urine was almost black. This was not associated with dysuria or pyuria. He also denied fever, chills, sweats, or flank pain.  He has no history of renal calculi or prostate disease. He did have a urinary tract infection in the recent past.  He resides at [facility]; his medications are placed in a weekly dispenser box. He is unable to name the specific medications    Review of Systems  His last PT/INR was approximately a month ago. He is not having epistaxis, hemoptysis, melena, rectal bleeding, or abnormal bruising or bleeding.     Objective:   Physical Exam General appearance : thin but adequately nourished w/o distress.Some facial masking  Eyes: No conjunctival inflammation or scleral icterus is present.  Oral exam: Dental hygiene is good; lips and gums are healthy appearing.There is no oropharyngeal erythema or exudate noted.   Heart:  Irregular rate and regular rhythm without gallop, murmur, click, rub or other extra sounds     Lungs:minor rhonchi & rales .No increased work of breathing.   Abdomen: bowel sounds normal, soft but minimally tender suprapubic  without masses, organomegaly or hernias noted.  No guarding or rebound   Skin:Warm & dry.  Intact without suspicious lesions or rashes ; no jaundice or tenting  Lymphatic: No lymphadenopathy is noted about the head, neck, axilla, or inguinal areas.   Genitalia normal except for atrophic  left testicle.Prostate is soft without enlargement, asymmetry, nodularity, or induration.    FOB negative             Assessment & Plan:  #1 hematuria. Urine reveals 3+ leukocytes as well as large blood.  #2 chronic anticoagulation for atrial fibrillation. PT/INR will  be checked.  Systemic issues are not suggested by history and the negative fecal occult  smear.

## 2011-12-05 NOTE — Addendum Note (Signed)
Addended by: Maurice Small on: 12/05/2011 01:55 PM   Modules accepted: Orders

## 2011-12-05 NOTE — Patient Instructions (Addendum)
If you activate My Chart; the results can be released to you as soon as they populate from the lab. If you choose not to use this program; the labs have to be reviewed, copied & mailed   causing a delay in getting the results to you. Review and correct the record as indicated. Please share record with all medical staff seen.  PT/INR is sub therapeutic (too low). Cipro should raise PT/INR.No change in warfarin dose; repeat PT/INR in 7 days

## 2011-12-09 LAB — URINE CULTURE

## 2011-12-15 ENCOUNTER — Encounter: Payer: Self-pay | Admitting: Cardiology

## 2011-12-15 ENCOUNTER — Ambulatory Visit (INDEPENDENT_AMBULATORY_CARE_PROVIDER_SITE_OTHER): Payer: Medicare Other | Admitting: Cardiology

## 2011-12-15 ENCOUNTER — Ambulatory Visit (INDEPENDENT_AMBULATORY_CARE_PROVIDER_SITE_OTHER): Payer: Medicare Other | Admitting: *Deleted

## 2011-12-15 VITALS — BP 130/66 | HR 82 | Resp 18 | Ht 73.0 in | Wt 170.4 lb

## 2011-12-15 DIAGNOSIS — I4891 Unspecified atrial fibrillation: Secondary | ICD-10-CM

## 2011-12-15 DIAGNOSIS — I951 Orthostatic hypotension: Secondary | ICD-10-CM

## 2011-12-15 LAB — POCT INR: INR: 2.1

## 2011-12-15 NOTE — Progress Notes (Signed)
Carlos Gonzalez Date of Birth:  [birth year] [facility] [address] [place], [place]  [address] [phone]  Fax   [phone]  HPI: This pleasant 78 year old gentleman is seen for a scheduled followup office visit. He has a history of established atrial fibrillation. He has parkinsonism. He has also had some problems with decreased memory. He also has what he describes as bilateral inguinal hernias for which he has had  consultation with Dr. Alwyn Ren and perhaps the surgeon. The patient is on long-term Coumadin because of his atrial fibrillation. The patient has a history of hypothyroidism and his TSH levels off Synthroid have remained normal most recently on 04/14/11 when his TSH was 3.26.  He has a history of orthostatic hypotension and is doing well on Florinef.   Current Outpatient Prescriptions  Medication Sig Dispense Refill  . carbidopa-levodopa (SINEMET IR) 25-100 MG per tablet Take 1 tablet by mouth 3 (three) times daily. 8am,11am,4pm  90 tablet  0  . ciprofloxacin (CIPRO) 500 MG tablet Take 1 tablet (500 mg total) by mouth 2 (two) times daily.  14 tablet  0  . citalopram (CELEXA) 20 MG tablet Take 1 tablet (20 mg total) by mouth daily.  90 tablet  1  . digoxin (LANOXIN) 0.25 MG tablet Take 0.25 mg by mouth. 1/2 by mouth daily      . fludrocortisone (FLORINEF) 0.1 MG tablet Take 0.1 mg by mouth daily.      Marland Kitchen warfarin (COUMADIN) 5 MG tablet Take as directed by anticoagulation clinic As of 12/05/2011: 5 mg M/W/F, 7.5 mg Sun/T/Th/Sat (subject to change)        Allergies  Allergen Reactions  . Sulfonamide Derivatives Nausea Only    1955 post knee surgery    Patient Active Problem List  Diagnosis  . HYPOTHYROIDISM  . HYPERKALEMIA  . GILBERT'S SYNDROME  . COAGULOPATHY, COUMADIN-INDUCED  . PARKINSON'S DISEASE  . ATRIAL FIBRILLATION  . ALLERGIC RHINITIS CAUSE UNSPECIFIED  . PNEUMONIA, RIGHT LOWER LOBE  . STRICTURE AND STENOSIS OF ESOPHAGUS  . BENIGN  PROSTATIC HYPERTROPHY  . DIZZINESS  . SLEEP DISORDER  . DYSPHAGIA UNSPECIFIED  . Nonspecific (abnormal) findings on radiological and other examination of body structure  . UNS ADVRS EFF UNS RX MEDICINAL&BIOLOGICAL SBSTNC  . SKIN CANCER, HX OF  . Personal history of colonic polyps  . BENIGN PROSTATIC HYPERTROPHY, HX OF  . CHEST XRAY, ABNORMAL  . Orthostatic hypotension  . Altered mental status  . Atrial fibrillation    History  Smoking status  . Never Smoker   Smokeless tobacco  . Never Used    History  Alcohol Use  . Yes    Comment: very rarely    Family History  Problem Relation Age of Onset  . Dementia Mother   . Heart disease Father     CHF  . Heart disease Paternal Grandfather     MI  . Cancer Brother     LUNG  . Heart disease Brother     BY-PASS    Review of Systems: The patient denies any heat or cold intolerance.  No weight gain or weight loss.  The patient denies headaches or blurry vision.  There is no cough or sputum production.  The patient denies dizziness.  There is no hematuria or hematochezia.  The patient denies any muscle aches or arthritis.  The patient denies any rash.  The patient denies frequent falling or instability.  There is no history of depression or anxiety.  All  other systems were reviewed and are negative.   Physical Exam: Filed Vitals:   12/15/11 1205  BP: 130/66  Pulse: 82  Resp: 18   the general appearance reveals a well-developed well-nourished gentleman in no distress.Pupils equal and reactive.   Extraocular Movements are full.  There is no scleral icterus.  The mouth and pharynx are normal.  The neck is supple.  The carotids reveal no bruits.  The jugular venous pressure is normal.  The thyroid is not enlarged.  There is no lymphadenopathy.  The chest is clear to percussion and auscultation. There are no rales or rhonchi. Expansion of the chest is symmetrical.  The precordium is quiet.  The first heart sound is normal.  The  second heart sound is physiologically split.  There is no murmur gallop rub or click.  There is no abnormal lift or heave.  Rhythm is irregularly irregular The abdomen is soft and nontender. Bowel sounds are normal. The liver and spleen are not enlarged. There Are no abdominal masses. There are no bruits.  The pedal pulses are good.  There is no phlebitis or edema.  There is no cyanosis or clubbing.       Assessment / Plan:  Continue same medication.  Recheck in 4 months.  We talked about the pros and cons of the newer anticoagulants and felt that it would be best for him to stay with warfarin.

## 2011-12-15 NOTE — Patient Instructions (Addendum)
Your physician recommends that you continue on your current medications as directed. Please refer to the Current Medication list given to you today.  Your physician recommends that you schedule a follow-up appointment in: 4 months  

## 2011-12-15 NOTE — Assessment & Plan Note (Signed)
Blood pressure was remaining stable on low-dose Florinef.  He has not had any edema or symptoms of CHF.

## 2011-12-15 NOTE — Assessment & Plan Note (Signed)
The patient remains in atrial fibrillation with controlled ventricular response.  No TIA symptoms.  He is not having any problems from his Coumadin.

## 2011-12-28 ENCOUNTER — Other Ambulatory Visit: Payer: Self-pay

## 2011-12-28 ENCOUNTER — Other Ambulatory Visit (INDEPENDENT_AMBULATORY_CARE_PROVIDER_SITE_OTHER): Payer: Medicare Other

## 2011-12-28 DIAGNOSIS — R319 Hematuria, unspecified: Secondary | ICD-10-CM

## 2011-12-28 DIAGNOSIS — N390 Urinary tract infection, site not specified: Secondary | ICD-10-CM

## 2011-12-28 LAB — POCT URINALYSIS DIPSTICK
Bilirubin, UA: NEGATIVE
Glucose, UA: NEGATIVE
Ketones, UA: NEGATIVE
Nitrite, UA: NEGATIVE
Spec Grav, UA: >=1.03
pH, UA: 6

## 2011-12-28 NOTE — Telephone Encounter (Signed)
UA & C&S needed ASAP

## 2011-12-28 NOTE — Telephone Encounter (Signed)
Pt coming in at 2pm for labs.

## 2011-12-28 NOTE — Telephone Encounter (Signed)
Pt daughter called stating pt still having UTI sxs requesting more Cipro or something stronger. OV/ last filled 12/05/11  PLz advise   MW ZO:[phone]

## 2011-12-28 NOTE — Telephone Encounter (Signed)
Pt need appt. (SEE PREVIOUS NOTES).  I asked pt daughter could she get him here today. Pt daughter is going to call me back. Awaiting her call  MW

## 2011-12-28 NOTE — Telephone Encounter (Signed)
Dr.Hopper please advise 

## 2011-12-28 NOTE — Telephone Encounter (Signed)
The urine culture 12/05/11 revealed 2 significant organisms. Cipro should have eradicated both of them. A clean-catch urinalysis needs be repeated with culture and sensitivity prior to starting Cipro or other antibiotic. We must rule out any resistant organism which was replaced these 2 sensitive bacteria.

## 2011-12-30 LAB — URINE CULTURE: Organism ID, Bacteria: NO GROWTH

## 2012-01-02 ENCOUNTER — Telehealth: Payer: Self-pay | Admitting: Internal Medicine

## 2012-01-02 NOTE — Telephone Encounter (Signed)
daughter called would like a call back as soon as possible with results fomr last wks labs -- note results stated letter mailed but pt. was never called Cb# [phone]--susan bullis

## 2012-01-02 NOTE — Telephone Encounter (Signed)
I spoke with Darl Pikes (patient's daughter) she was informed of results and instructed about MyChart. Darl Pikes stated she spoke with her father this am and he stated he was better, if they feel the need for a specialist they will call.

## 2012-01-16 ENCOUNTER — Telehealth: Payer: Self-pay | Admitting: Cardiology

## 2012-01-16 NOTE — Telephone Encounter (Signed)
New problem:    C/O right foot swelling.

## 2012-01-16 NOTE — Telephone Encounter (Signed)
Per daughter patient does not seem to have any increased shortness of breath, discoloration in legs, or pain. She is unsure if swelling goes down or not.  Scheduled appointment for patient to see  Dr. Patty Sermons in the morning

## 2012-01-16 NOTE — Telephone Encounter (Signed)
**Note De-Identified  Obfuscation** Will forward to Regis Bill, Dr. Yevonne Pax nurse.

## 2012-01-16 NOTE — Telephone Encounter (Signed)
Pt's dtr calling back, pt is having significant swelling, wanting an appt today, pls call to advise if pt needs to be seen today or not, pls call [phone]

## 2012-01-17 ENCOUNTER — Ambulatory Visit (INDEPENDENT_AMBULATORY_CARE_PROVIDER_SITE_OTHER): Payer: Medicare Other | Admitting: Cardiology

## 2012-01-17 ENCOUNTER — Ambulatory Visit (INDEPENDENT_AMBULATORY_CARE_PROVIDER_SITE_OTHER): Payer: Medicare Other | Admitting: *Deleted

## 2012-01-17 ENCOUNTER — Encounter: Payer: Self-pay | Admitting: Cardiology

## 2012-01-17 ENCOUNTER — Telehealth: Payer: Self-pay | Admitting: Internal Medicine

## 2012-01-17 ENCOUNTER — Ambulatory Visit
Admission: RE | Admit: 2012-01-17 | Discharge: 2012-01-17 | Disposition: A | Discharge status: Home / Self Care | Payer: Medicare Other | Source: Ambulatory Visit | Attending: Cardiology | Admitting: Cardiology

## 2012-01-17 VITALS — BP 110/70 | HR 73 | Resp 18 | Ht 73.0 in | Wt 170.0 lb

## 2012-01-17 DIAGNOSIS — R32 Unspecified urinary incontinence: Secondary | ICD-10-CM | POA: Insufficient documentation

## 2012-01-17 DIAGNOSIS — R600 Localized edema: Secondary | ICD-10-CM

## 2012-01-17 DIAGNOSIS — R0609 Other forms of dyspnea: Secondary | ICD-10-CM

## 2012-01-17 DIAGNOSIS — R609 Edema, unspecified: Secondary | ICD-10-CM

## 2012-01-17 DIAGNOSIS — I4891 Unspecified atrial fibrillation: Secondary | ICD-10-CM

## 2012-01-17 DIAGNOSIS — G20 Parkinson's disease: Secondary | ICD-10-CM

## 2012-01-17 LAB — POCT INR: INR: 2.1

## 2012-01-17 NOTE — Assessment & Plan Note (Signed)
For the past month or more the patient has been having difficulty with his bladder and his urinary pattern.  He has constant dribbling.  He states that he voids only small amounts at a time.  He has not been having any abdominal pain or discomfort.  His weight is unchanged at 170 pounds.  He does not have a urologist at present.

## 2012-01-17 NOTE — Patient Instructions (Addendum)
Will have you go for a chest xray today and call with the results [place])  Keep your regular appointment   Your physician recommends that you continue on your current medications as directed. Please refer to the Current Medication list given to you today.

## 2012-01-17 NOTE — Progress Notes (Signed)
Carlos Gonzalez Date of Birth:  Aug 28, 1930 [facility] [address] [place], [place]  [address] [phone]         Fax   [phone]  History of Present Illness: This pleasant 78 year old gentleman is seen for a work in office visit.  His daughter-in-law brought him in.  He has been having increased problems with bladder control and has had some shortness of breath and peripheral edema. He has a history of established atrial fibrillation. He has parkinsonism. He has also had some problems with decreased memory. He also has what he describes as bilateral inguinal hernias for which he has had consultation with Dr. Alwyn Ren and perhaps the surgeon. The patient is on long-term Coumadin because of his atrial fibrillation. The patient has a history of hypothyroidism and his TSH levels off Synthroid have remained normal most recently on 04/14/11 when his TSH was 3.26. He has a history of orthostatic hypotension and is doing well on Florinef.   Current Outpatient Prescriptions  Medication Sig Dispense Refill  . carbidopa-levodopa (SINEMET IR) 25-100 MG per tablet Take 1 tablet by mouth 3 (three) times daily. 8am,11am,4pm  90 tablet  0  . citalopram (CELEXA) 20 MG tablet Take 1 tablet (20 mg total) by mouth daily.  90 tablet  1  . digoxin (LANOXIN) 0.25 MG tablet Take 0.25 mg by mouth. 1/2 by mouth daily      . fludrocortisone (FLORINEF) 0.1 MG tablet Take 0.1 mg by mouth daily.      Marland Kitchen warfarin (COUMADIN) 5 MG tablet Take as directed by anticoagulation clinic As of 12/05/2011: 5 mg M/W/F, 7.5 mg Sun/T/Th/Sat (subject to change)        Allergies  Allergen Reactions  . Sulfonamide Derivatives Nausea Only    1955 post knee surgery    Patient Active Problem List  Diagnosis  . HYPOTHYROIDISM  . HYPERKALEMIA  . GILBERT'S SYNDROME  . COAGULOPATHY, COUMADIN-INDUCED  . PARKINSON'S DISEASE  . ATRIAL FIBRILLATION  . ALLERGIC RHINITIS CAUSE UNSPECIFIED  . PNEUMONIA, RIGHT  LOWER LOBE  . STRICTURE AND STENOSIS OF ESOPHAGUS  . BENIGN PROSTATIC HYPERTROPHY  . DIZZINESS  . SLEEP DISORDER  . DYSPHAGIA UNSPECIFIED  . Nonspecific (abnormal) findings on radiological and other examination of body structure  . UNS ADVRS EFF UNS RX MEDICINAL&BIOLOGICAL SBSTNC  . SKIN CANCER, HX OF  . Personal history of colonic polyps  . BENIGN PROSTATIC HYPERTROPHY, HX OF  . CHEST XRAY, ABNORMAL  . Orthostatic hypotension  . Altered mental status  . Atrial fibrillation    History  Smoking status  . Never Smoker   Smokeless tobacco  . Never Used    History  Alcohol Use  . Yes    Comment: very rarely    Family History  Problem Relation Age of Onset  . Dementia Mother   . Heart disease Father     CHF  . Heart disease Paternal Grandfather     MI  . Cancer Brother     LUNG  . Heart disease Brother     BY-PASS    Review of Systems: Constitutional: no fever chills diaphoresis or fatigue or change in weight.  Head and neck: no hearing loss, no epistaxis, no photophobia or visual disturbance. Respiratory: No cough, shortness of breath or wheezing. Cardiovascular: No chest pain peripheral edema, palpitations. Gastrointestinal: No abdominal distention, no abdominal pain, no change in bowel habits hematochezia or melena. Genitourinary: No dysuria, no frequency, no urgency, no nocturia. Musculoskeletal:No arthralgias, no  back pain, no gait disturbance or myalgias. Neurological: No dizziness, no headaches, no numbness, no seizures, no syncope, no weakness, no tremors. Hematologic: No lymphadenopathy, no easy bruising. Psychiatric: No confusion, no hallucinations, no sleep disturbance.    Physical Exam: Filed Vitals:   01/17/12 1025  BP: 110/70  Pulse: 73  Resp: 18   the general appearance reveals a tall gentleman in no acute distress.  He does have evidence of mild dementia with decreased memory.The head and neck exam reveals pupils equal and reactive.   Extraocular movements are full.  There is no scleral icterus.  The mouth and pharynx are normal.  The neck is supple.  The carotids reveal no bruits.  The jugular venous pressure is normal.  The  thyroid is not enlarged.  There is no lymphadenopathy.  The chest is clear to percussion and auscultation.  There are no rales or rhonchi.  Expansion of the chest is symmetrical.  The precordium is quiet.  The rate is irregular in atrial fibrillation with a controlled ventricular response  The first heart sound is normal.  The second heart sound is physiologically split.  There is no murmur gallop rub or click.  There is no abnormal lift or heave.  The abdomen is soft and nontender.  The bowel sounds are normal.  The liver and spleen are not enlarged.  There are no abdominal masses.  There are no abdominal bruits.  Extremities reveal good pedal pulses.  There is 1+ ankle and pedal edema bilaterally. There is no cyanosis or clubbing.  Strength is normal and symmetrical in all extremities.  There is no lateralizing weakness.  There are no sensory deficits.  The skin is warm and dry.  There is no rash.  EKG today shows atrial fibrillation with a controlled ventricular response   Assessment / Plan: In regard to his exertional dyspnea and ankle edema, we will get a chest x-ray look for evidence of CHF.  On exam his lungs are clear and his weight is unchanged from previous visit. I suspect that the patient may be having some problems with bladder outlet obstruction from an enlarged prostate.  This could also be affecting his Ability to excrete adequate amounts of urine thus leading to peripheral edema.  I have suggested that if he talk with his primary care provider about a possible urology consultation.  Patient is having ongoing dribbling and incontinence. The patient will continue same cardiac medications and be rechecked at his previously scheduled visit in March 2014.

## 2012-01-17 NOTE — Assessment & Plan Note (Signed)
The patient states that his symptoms of Parkinson's abdomen relatively stable on current medication

## 2012-01-17 NOTE — Assessment & Plan Note (Addendum)
Patient denies dyspnea at rest but does have dyspnea on exertion such as climbing stairs.  He has not been having any orthopnea or paroxysmal nocturnal dyspnea he does have 1+ ankle edema

## 2012-01-17 NOTE — Telephone Encounter (Signed)
I returned patient's call, no answer.  I left message on machine, for a referral request, he would first need to see his provider, and/or request the referral from his provider, as Dr. Alwyn Ren will have to authorize and enter the referral.

## 2012-01-17 NOTE — Telephone Encounter (Signed)
Pt needs to speak with you they need a referral

## 2012-01-18 ENCOUNTER — Telehealth: Payer: Self-pay | Admitting: Internal Medicine

## 2012-01-18 ENCOUNTER — Telehealth: Payer: Self-pay | Admitting: *Deleted

## 2012-01-18 ENCOUNTER — Encounter: Payer: Self-pay | Admitting: Internal Medicine

## 2012-01-18 ENCOUNTER — Ambulatory Visit (INDEPENDENT_AMBULATORY_CARE_PROVIDER_SITE_OTHER): Payer: Medicare Other | Admitting: Internal Medicine

## 2012-01-18 VITALS — BP 112/78 | HR 71 | Temp 97.5°F | Wt 169.8 lb

## 2012-01-18 DIAGNOSIS — R350 Frequency of micturition: Secondary | ICD-10-CM

## 2012-01-18 DIAGNOSIS — R32 Unspecified urinary incontinence: Secondary | ICD-10-CM

## 2012-01-18 DIAGNOSIS — R609 Edema, unspecified: Secondary | ICD-10-CM

## 2012-01-18 MED ORDER — MIRABEGRON ER 25 MG PO TB24: 25.0000 mg | ORAL_TABLET | Freq: Every day | ORAL | Status: DC

## 2012-01-18 NOTE — Telephone Encounter (Signed)
Per Dr.Hopper: Carlos Gonzalez to place referral  I called patients daughter to inform her that referral will be placed and she then said "Make sure its labled as STAT" Dr.Hopper was standing in front of me and over heard conversation and indicated patient will need to be re-evaluated for order to be placed as STAT. Appointment scheduled for today.

## 2012-01-18 NOTE — Telephone Encounter (Signed)
Advised daughter of results.  

## 2012-01-18 NOTE — Patient Instructions (Addendum)
Review and correct the record as indicated. Please share record with all medical staff seen.  

## 2012-01-18 NOTE — Telephone Encounter (Signed)
Patients daughter states the patient needs a urology referral and has seen Dr. Alwyn Ren for a UTI recently and saw Dr. Patty Sermons yesterday regarding other urinary symptoms. The patient's daughter states the patient should not need an appointment with Dr. Alwyn Ren because a urology referral has already been discussed, although I do not see the documentation. Please advise. Patient's daughter can be reached on her mobile #.

## 2012-01-18 NOTE — Telephone Encounter (Signed)
Message copied by Burnell Blanks on Wed Jan 18, 2012 12:15 PM ------      Message from: Cassell Clement      Created: Tue Jan 17, 2012 12:30 PM       Please report.  The chest x-ray does not show any signs of congestive heart failure.  The heart size remains normal.

## 2012-01-18 NOTE — Progress Notes (Signed)
  Subjective:    Patient ID: Carlos Gonzalez, male    DOB: [birth year], 78 y.o.   MRN: [id number]  HPI  He's had edema for approximately one week. His cardiologist questioned possible component of prostatic hypertrophy with decreased urine output & subsequent edema. His prostate was checked 12/05/11. It was soft and not enlarged.  He was treated at that time for a significant urinary tract infection with Klebsiella pneumonia and Escherichia coli. Because of recurrent symptoms urine was recultured 11/20 and was negative.    Review of Systems He describes urinary frequency at least 4 times during the day and at least 2-3 times at night . He does describe some incontinence at times. He does have some low back pain bilaterally. He denies fever, chills, sweats, hematuria, pyuria, or dysuria.     Objective:   Physical Exam   He has stigmata of Parkinson's he is in no acute distress  Breath sounds are decreased; there is no increased work of breathing  His weight is well-controlled with the rhythm is irregular  He has no organomegaly or masses. There is no suprapubic distention  1+ pedal edema is present on the right 1/2+ on the left.  There is increased rigidity and difficulty lying down and sit up without help. Straight leg raising is negative. Range of motion of lower extremities is reduced.          Assessment & Plan:  #1 urinary frequency and incontinence  #2 edema  Plan: Renal function testing. If renal function is impaired;renal  ultrasound could be performed. A trial of anti-incontinence agent  will be initiated and referral made to urology.

## 2012-01-19 LAB — BASIC METABOLIC PANEL
BUN: 25 mg/dL — ABNORMAL HIGH (ref 6–23)
Calcium: 8.7 mg/dL (ref 8.4–10.5)
Chloride: 105 mEq/L (ref 96–112)
Creatinine, Ser: 1.3 mg/dL (ref 0.4–1.5)

## 2012-02-03 ENCOUNTER — Telehealth: Payer: Self-pay | Admitting: Internal Medicine

## 2012-02-03 NOTE — Telephone Encounter (Signed)
Patient's daughter states [facility] told her that Dr. Alwyn Ren was trying to drop this patient. She wants Dr. Alwyn Ren to know that the patient is in independent living. She would like a call back at [phone]. She is aware Dr. Alwyn Ren is out of the office.

## 2012-02-07 NOTE — Telephone Encounter (Signed)
Discuss with patient  

## 2012-02-07 NOTE — Telephone Encounter (Signed)
Spoke with patient's daughter, patient's daughter was informed that Dr.Hopper recommends that patient get's MD at [facility] but she prefers that Dr.Hopper remain as PCP. Darl Pikes states that she does not think they have a MD at [facility] for the patient is in independent living area of facility.  Hopp please provide response

## 2012-02-07 NOTE — Telephone Encounter (Signed)
I was trying to make care needs easier; it is of course his choice

## 2012-03-06 ENCOUNTER — Ambulatory Visit (INDEPENDENT_AMBULATORY_CARE_PROVIDER_SITE_OTHER): Payer: Medicare Other | Admitting: *Deleted

## 2012-03-06 DIAGNOSIS — I4891 Unspecified atrial fibrillation: Secondary | ICD-10-CM

## 2012-03-06 LAB — POCT INR: INR: 2.4

## 2012-03-19 ENCOUNTER — Telehealth: Payer: Self-pay | Admitting: Internal Medicine

## 2012-03-19 NOTE — Telephone Encounter (Signed)
Left message to call office

## 2012-03-19 NOTE — Telephone Encounter (Signed)
Last OV 01/18/12, Hopp please advise on request for sleep aid

## 2012-03-19 NOTE — Telephone Encounter (Signed)
Patient's son called stating the CMA's that are caring for his father are recommending that Dr. Alwyn Ren prescribe him something to help him sleep. IPatient's son states it is difficult to get him to the office. Pt uses [facility]

## 2012-03-19 NOTE — Telephone Encounter (Signed)
This would need to be Rxed by his Neurologist as it might impact his Parkinsons

## 2012-03-28 ENCOUNTER — Telehealth: Payer: Self-pay | Admitting: *Deleted

## 2012-03-28 ENCOUNTER — Other Ambulatory Visit: Payer: Self-pay | Admitting: *Deleted

## 2012-03-28 DIAGNOSIS — R32 Unspecified urinary incontinence: Secondary | ICD-10-CM

## 2012-03-28 MED ORDER — WARFARIN SODIUM 5 MG PO TABS: ORAL_TABLET | ORAL | Status: DC

## 2012-03-28 NOTE — Telephone Encounter (Signed)
01-18-12 Last OV , last filled  #30 1

## 2012-03-28 NOTE — Telephone Encounter (Signed)
OK X3 months unless changed by Urology

## 2012-03-29 ENCOUNTER — Telehealth: Payer: Self-pay | Admitting: Internal Medicine

## 2012-03-29 MED ORDER — MIRABEGRON ER 25 MG PO TB24: 25.0000 mg | ORAL_TABLET | Freq: Every day | ORAL | Status: DC

## 2012-03-29 NOTE — Telephone Encounter (Signed)
Noted  

## 2012-03-29 NOTE — Telephone Encounter (Signed)
Pharmacist calling to get approval for Myrbetriq 25mg  - last refill was for 30 tablets on 02/27/2012.  Notes in EPIC with Dr Alwyn Ren giving approval for #90 pills, no refills.  Order read to pharmacist.

## 2012-03-29 NOTE — Telephone Encounter (Signed)
Rx sent 

## 2012-04-06 NOTE — Telephone Encounter (Signed)
Left message to call office, [employer] after attempts to contact Pt.

## 2012-04-16 ENCOUNTER — Telehealth: Payer: Self-pay | Admitting: Cardiology

## 2012-04-16 NOTE — Telephone Encounter (Signed)
Follow up call      He did have swelling in feet.  Some what red / slightly pink . No infection . No heat .  Will try for couples of day to keep feet elevated.

## 2012-04-16 NOTE — Telephone Encounter (Signed)
Spoke with daughter she will call back and give [place] update

## 2012-04-16 NOTE — Telephone Encounter (Signed)
New Problem:    Patient called in wanting to know if her father needed to be seen because he is on coumadin and had fallen over the weekend.  Patient did not hit his head, but is having swelling in his feet and has some small scraps.  Please call back.

## 2012-04-16 NOTE — Telephone Encounter (Signed)
Feet swelling like they were a few months ago, does not keep feet elevated.  Daughter states he mentally has a hard time doing that. The swelling did go down but it has come back. Daughter is unsure if it goes down at night. Daughter does not think he hit his head. Only injury was tearing skin off elbow.  Daughter is going to check on her father now and will call back after she evaluates him

## 2012-04-18 ENCOUNTER — Telehealth: Payer: Self-pay | Admitting: Cardiology

## 2012-04-18 NOTE — Telephone Encounter (Signed)
Pt is having leg swelling and was told by melinda that pt could be worked in today and that is what she wants

## 2012-04-18 NOTE — Telephone Encounter (Signed)
Spoke to patient's daughter Darl Pikes she stated father has had swelling in lower legs and feet for 1 week and is worse.No sob.Daughter was offered for me to go to our DOD,but she wanted to wait until Dr.Brackbill's nurse is back in office tomorrow 04/23/12.Advised of no salt and keep legs elevated.Message sent to Dr.Brackbill's nurse.

## 2012-04-19 ENCOUNTER — Ambulatory Visit (INDEPENDENT_AMBULATORY_CARE_PROVIDER_SITE_OTHER): Payer: Medicare Other | Admitting: Cardiology

## 2012-04-19 ENCOUNTER — Encounter: Payer: Self-pay | Admitting: Cardiology

## 2012-04-19 ENCOUNTER — Ambulatory Visit (INDEPENDENT_AMBULATORY_CARE_PROVIDER_SITE_OTHER): Payer: Medicare Other | Admitting: *Deleted

## 2012-04-19 VITALS — BP 112/70 | HR 64 | Ht 73.0 in | Wt 178.8 lb

## 2012-04-19 DIAGNOSIS — R609 Edema, unspecified: Secondary | ICD-10-CM

## 2012-04-19 DIAGNOSIS — G20 Parkinson's disease: Secondary | ICD-10-CM

## 2012-04-19 DIAGNOSIS — R4182 Altered mental status, unspecified: Secondary | ICD-10-CM

## 2012-04-19 DIAGNOSIS — I4891 Unspecified atrial fibrillation: Secondary | ICD-10-CM

## 2012-04-19 DIAGNOSIS — I951 Orthostatic hypotension: Secondary | ICD-10-CM

## 2012-04-19 DIAGNOSIS — G20A1 Parkinson's disease without dyskinesia, without mention of fluctuations: Secondary | ICD-10-CM

## 2012-04-19 NOTE — Progress Notes (Signed)
Carlos Gonzalez Date of Birth:  1930/02/11 [facility] [address] [place], [place]  [address] [phone]         Fax   [phone]  History of Present Illness: This pleasant 79 year old gentleman is seen for a work in office visit. His daughter-in-law brought him in. He has been having increased problems with  peripheral edema. He has a history of established atrial fibrillation. He has parkinsonism. He has also had some problems with decreased memory. He also has what he describes as bilateral inguinal hernias for which he has had consultation with Dr. Alwyn Ren and perhaps the surgeon. The patient is on long-term Coumadin because of his atrial fibrillation. The patient has a history of hypothyroidism and his TSH levels off Synthroid have remained normal most recently on 04/14/11 when his TSH was 3.26. He has a history of orthostatic hypotension and is doing well on Florinef.  His weight is up 8 pounds since last visit.  His last echocardiogram was in 2006.  His last chest x-ray was 01/17/12 and showed normal heart size and no CHF.  He was having peripheral edema then but it was not as severe as it is now.   Current Outpatient Prescriptions  Medication Sig Dispense Refill  . carbidopa-levodopa (SINEMET IR) 25-100 MG per tablet Take 1 tablet by mouth 3 (three) times daily. 8am,11am,4pm  90 tablet  0  . citalopram (CELEXA) 20 MG tablet Take 1 tablet (20 mg total) by mouth daily.  90 tablet  1  . digoxin (LANOXIN) 0.25 MG tablet Take 0.25 mg by mouth. 1/2 by mouth daily      . fludrocortisone (FLORINEF) 0.1 MG tablet Take 0.1 mg by mouth as directed. One tablet every other day      . warfarin (COUMADIN) 5 MG tablet Take as directed by anticoagulation clinic  45 tablet  3   No current facility-administered medications for this visit.    Allergies  Allergen Reactions  . Sulfonamide Derivatives Nausea Only    1957 post knee surgery    Patient Active Problem List    Diagnosis  . HYPOTHYROIDISM  . HYPERKALEMIA  . GILBERT'S SYNDROME  . COAGULOPATHY, COUMADIN-INDUCED  . PARKINSON'S DISEASE  . ATRIAL FIBRILLATION  . ALLERGIC RHINITIS CAUSE UNSPECIFIED  . PNEUMONIA, RIGHT LOWER LOBE  . STRICTURE AND STENOSIS OF ESOPHAGUS  . BENIGN PROSTATIC HYPERTROPHY  . DIZZINESS  . SLEEP DISORDER  . DYSPHAGIA UNSPECIFIED  . Nonspecific (abnormal) findings on radiological and other examination of body structure  . UNS ADVRS EFF UNS RX MEDICINAL&BIOLOGICAL SBSTNC  . SKIN CANCER, HX OF  . Personal history of colonic polyps  . BENIGN PROSTATIC HYPERTROPHY, HX OF  . CHEST XRAY, ABNORMAL  . Orthostatic hypotension  . Altered mental status  . Atrial fibrillation  . Incontinence of urine  . DOE (dyspnea on exertion)    History  Smoking status  . Never Smoker   Smokeless tobacco  . Never Used    History  Alcohol Use  . Yes    Comment: very rarely    Family History  Problem Relation Age of Onset  . Dementia Mother   . Heart disease Father     CHF  . Heart disease Paternal Grandfather     MI  . Cancer Brother     LUNG  . Heart disease Brother     BY-PASS    Review of Systems: Constitutional: no fever chills diaphoresis or fatigue or change in weight.  Head  and neck: no hearing loss, no epistaxis, no photophobia or visual disturbance. Respiratory: No cough, shortness of breath or wheezing. Cardiovascular: No chest pain peripheral edema, palpitations. Gastrointestinal: No abdominal distention, no abdominal pain, no change in bowel habits hematochezia or melena. Genitourinary: No dysuria, no frequency, no urgency, no nocturia. Musculoskeletal:No arthralgias, no back pain, no gait disturbance or myalgias. Neurological: No dizziness, no headaches, no numbness, no seizures, no syncope, no weakness, no tremors. Hematologic: No lymphadenopathy, no easy bruising. Psychiatric: No confusion, no hallucinations, no sleep disturbance.    Physical  Exam: Filed Vitals:   04/19/12 1327  BP: 112/70  Pulse: 64   general appearance reveals a somewhat disheveled elderly gentleman who is demented.  He has difficulty following directions.The head and neck exam reveals pupils equal and reactive.  Extraocular movements are full.  There is no scleral icterus.  The mouth and pharynx are normal.  The neck is supple.  The carotids reveal no bruits.  The jugular venous pressure is normal.  The  thyroid is not enlarged.  There is no lymphadenopathy.  The chest is clear to percussion and auscultation.  There are no rales or rhonchi.  Expansion of the chest is symmetrical.  The precordium is quiet.  The pulse is irregularly irregular  The first heart sound is normal.  The second heart sound is physiologically split.  There is no murmur gallop rub or click.  There is no abnormal lift or heave.  The abdomen is soft and nontender.  The bowel sounds are normal.  The liver and spleen are not enlarged.  There are no abdominal masses.  There are no abdominal bruits.  Extremities reveal good pedal pulses.  There is 4+ pitting edema of both lower extremities.  There is no cyanosis or clubbing.  Strength is normal and symmetrical in all extremities.  There is no lateralizing weakness.  There are no sensory deficits.  The skin is warm and dry.  There is no rash.     Assessment / Plan: The worsening anemia may be a combination of factors including his increased salt intake.  He has been eating a lot of salty crackers.  Also the Florinef is contributing to his sore retention and fluid retention.  We are going to decrease his Florinef to just every other day.  If this fails to resolve the issue his daughter will call me and we can consider stopping it altogether.  He will also wear support hose starting each morning and wearing them until bedtime.  Also he was counseled to try to keep his legs elevated on a cushion during the day but he probably will not remember to do  this. Recheck in 6 weeks for followup office visit

## 2012-04-19 NOTE — Patient Instructions (Addendum)
DECREASE FLORINEF TO ONE TABLET EVERY OTHER DAY  Try support hose every morning  Your physician recommends that you schedule a follow-up appointment in: 6 weeks  If no better call back

## 2012-04-19 NOTE — Assessment & Plan Note (Signed)
The patient has been on Florinef for symptoms of orthostatic hypotension.  However now he has had significant fluid retention with severe bilateral peripheral edema of his legs.  He has also been eating a high salt diet.  He is not on any amlodipine or other medication which would be causing salt retention.

## 2012-04-19 NOTE — Assessment & Plan Note (Signed)
The patient has dementia.  He presently lives in an apartment at [facility] but the nurses are providing a lot of care for him including supervising his medication

## 2012-04-19 NOTE — Telephone Encounter (Signed)
Spoke with daughter and scheduled appointment for ov today with  Dr. Patty Sermons

## 2012-04-19 NOTE — Assessment & Plan Note (Signed)
The patient is an established chronic atrial fibrillation.  He is on digoxin and warfarin.  He has not been having any TIA symptoms.

## 2012-04-19 NOTE — Telephone Encounter (Signed)
Patient seeing  Dr. Brackbill today 

## 2012-04-19 NOTE — Assessment & Plan Note (Addendum)
The patient has a past history of Parkinson's disease and has been followed by Dr. Sandria Manly.  He is on Sinemet

## 2012-04-23 ENCOUNTER — Telehealth: Payer: Self-pay | Admitting: Cardiology

## 2012-04-23 NOTE — Telephone Encounter (Signed)
Advised daughter 

## 2012-04-23 NOTE — Telephone Encounter (Signed)
Pt having tooth extracted, needs to come off coumadin for four days, can get in this Thursday if he can go off coumadin today, pls call dtr [phone] susan bullis

## 2012-04-23 NOTE — Telephone Encounter (Signed)
Okay to leave off Coumadin 4 days

## 2012-04-23 NOTE — Telephone Encounter (Signed)
Will forward to  Dr. Brackbill for review 

## 2012-04-27 ENCOUNTER — Telehealth: Payer: Self-pay | Admitting: *Deleted

## 2012-05-01 NOTE — Telephone Encounter (Signed)
CALLED TO RESCHEDULE PT.

## 2012-05-02 ENCOUNTER — Telehealth: Payer: Self-pay | Admitting: Internal Medicine

## 2012-05-02 MED ORDER — CITALOPRAM HYDROBROMIDE 20 MG PO TABS: 20.0000 mg | ORAL_TABLET | Freq: Every day | ORAL | Status: DC

## 2012-05-02 NOTE — Telephone Encounter (Signed)
Patient's wife called requesting rx for citalopram, She states [facility] has sent request, but never heard back from [place].

## 2012-05-02 NOTE — Telephone Encounter (Signed)
Rx sent electronically, this is the first notification we have received to refill Citalopram

## 2012-05-03 ENCOUNTER — Ambulatory Visit: Payer: Self-pay | Admitting: Neurology

## 2012-05-08 ENCOUNTER — Ambulatory Visit: Payer: Self-pay | Admitting: Neurology

## 2012-05-24 ENCOUNTER — Ambulatory Visit (INDEPENDENT_AMBULATORY_CARE_PROVIDER_SITE_OTHER): Payer: Medicare Other | Admitting: Neurology

## 2012-05-24 ENCOUNTER — Encounter: Payer: Self-pay | Admitting: Cardiology

## 2012-05-24 ENCOUNTER — Encounter: Payer: Self-pay | Admitting: Neurology

## 2012-05-24 VITALS — BP 113/65 | HR 64 | Temp 97.5°F | Ht 73.0 in | Wt 164.0 lb

## 2012-05-24 DIAGNOSIS — F0390 Unspecified dementia without behavioral disturbance: Secondary | ICD-10-CM

## 2012-05-24 DIAGNOSIS — G20A1 Parkinson's disease without dyskinesia, without mention of fluctuations: Secondary | ICD-10-CM

## 2012-05-24 DIAGNOSIS — G4700 Insomnia, unspecified: Secondary | ICD-10-CM

## 2012-05-24 DIAGNOSIS — G20 Parkinson's disease: Secondary | ICD-10-CM

## 2012-05-24 MED ORDER — QUETIAPINE FUMARATE 25 MG PO TABS: 25.0000 mg | ORAL_TABLET | Freq: Every day | ORAL | Status: DC

## 2012-05-24 MED ORDER — CARBIDOPA-LEVODOPA 25-100 MG PO TABS: 1.0000 | ORAL_TABLET | Freq: Four times a day (QID) | ORAL | Status: DC

## 2012-05-24 NOTE — Progress Notes (Signed)
Subjective:    Patient ID: Carlos Gonzalez is a 79 y.o. male.  HPI  Interim history:  Carlos Gonzalez is a very pleasant 79 year old right-handed gentleman who presents for followup consultation of his Parkinson's disease. He is accompanied by his daughter and son-in-law today and this is his first visit with me in he was previously followed by Dr. Fayrene Fearing love. He was last seen by Dr. love on 11/01/2011, at which time Dr. love felt he was doing well and because of hallucinations he was hesitant to increase his Parkinson's medication. His fall assessment tool score was 18 at the time. He is currently on Celexa 20 mg, carbidopa-levodopa 25-100 mg strength one tablet at 8 AM, 11 AM and 4 PM, digoxin 0.25 mg daily, Coumadin, Florinef. He has an underlying medical history of thyroid disease, atrial fibrillation, dementia, COPD, Parkinson's disease and postural hypotension. His family reports that his hallucinations have become more and he is currently residing at [facility] in the independent living portion but the staff has voiced concern that his care is at this moment beyond their care capacity and his family is requesting assessment whether he should be moved into the memory unit. In the last 2 months he has had more VH and AH and also tremors are worse. He has had multiple falls, but has had no head injury or LOC. His son states that pt did bump his head while trying to step over an ottoman. He needs more assistance with his ADLs, except for feeding himself. His son states that his sleep pattern is much worse and wakes very early in AM, sometimes at 2 AM, sometimes at 4 AM. He endorses VH, including seeing a man in the mirror.  He was given a rolling walker last week, but keeps leaving it by the elevator.   I reviewed Dr. Imagene Gurney prior notes and the patient's records and below is a summary of that review of:  79 year old right-handed gentleman with new onset left foot and leg tremor in 2000 followed  by left hand and on tremor. He was initially seen at [facility] in 2002 and was started on Requip and Sinemet as well as selegiline later. He developed postural hypotension and double vision and some unusual behaviors at night as well as excessive daytime somnolence. In November 2011 he started having visual hallucinations. He has also had memory loss. He had a fall in 2011 and his wife noticed some compulsive behaviors. He has lost some weight. There is no history of Parkinson's disease in the family. He is independent in his ADLs. He received speech therapy for 9 months. Swallowing was also evaluated with a swallow study. In July 2012 he was admitted to to [facility] with orthostatic hypotension in the context of urinary tract infection. CT head was normal at the time. In December 2012 his MMSE was 29, clock drawing was 4, animal fluency was 17. He in May 2013 he was admitted to [facility] with agitation and delirium and dehydration and again was found to have a urinary tract infection. He is now staying at independent living at [facility]. In May 2013 his MMSE was 27, clock drawing was poor, animal fluency was 24. After Requip was discontinued his hallucinations improved. In September 2013 his MMSE was 27, clock drawing was 4, animal fluency was 23.  His Past Medical History Is Significant For: Past Medical History  Diagnosis Date  . Parkinson's disease     Dr Avie Echevaria  . Hypertension   .  Hypothyroidism     Caused by Amiodarone, resolved  . Orthostatic hypotension     Chronic  . Atrial fibrillation   . Chronic anticoagulation   . BPH (benign prostatic hyperplasia)   . Cancer     hx of skin cancer;? basal cell  . Gastritis   . Shortness of breath   . Pneumonia     hx of   . Blood dyscrasia     chronic anticoagulation    His Past Surgical History Is Significant For: Past Surgical History  Procedure Laterality Date  . Hernia repair       X 4 total  . Appendectomy  Age  86    S/P  rupture  . Knee surgery  Age 34  . US echocardiography  July 2012    EF 55-60%  . Cardiovascular stress test  09-04-2003    EF 66%  . Middle ear surgery      His Family History Is Significant For: Family History  Problem Relation Age of Onset  . Dementia Mother   . Heart disease Father     CHF  . Heart disease Paternal Grandfather     MI  . Cancer Brother     LUNG  . Heart disease Brother     BY-PASS    His Social History Is Significant For: History   Social History  . Marital Status: Legally Separated    Spouse Name: N/A    Number of Children: N/A  . Years of Education: N/A   Social History Main Topics  . Smoking status: Never Smoker   . Smokeless tobacco: Never Used  . Alcohol Use: Yes     Comment: very rarely  . Drug Use: No  . Sexually Active: No   Other Topics Concern  . None   Social History Narrative   DAILY CAFFEINE   NO EXERCISE    His Allergies Are:  Allergies  Allergen Reactions  . Sulfonamide Derivatives Nausea Only    1955 post knee surgery  :   His Current Medications Are:  Outpatient Encounter Prescriptions as of 05/24/2012  Medication Sig Dispense Refill  . carbidopa-levodopa (SINEMET IR) 25-100 MG per tablet Take 1 tablet by mouth 3 (three) times daily. 8am,11am,4pm  90 tablet  0  . citalopram (CELEXA) 20 MG tablet Take 1 tablet (20 mg total) by mouth daily.  90 tablet  1  . digoxin (LANOXIN) 0.25 MG tablet Take 0.25 mg by mouth. 1/2 by mouth daily      . fludrocortisone (FLORINEF) 0.1 MG tablet Take 0.1 mg by mouth as directed. One tablet every other day      . warfarin (COUMADIN) 5 MG tablet 5 mg M/W/F, 7.5 mg S/T/TH/S      . [DISCONTINUED] warfarin (COUMADIN) 5 MG tablet Take as directed by anticoagulation clinic  45 tablet  3   No facility-administered encounter medications on file as of 05/24/2012.   Review of Systems  Constitutional: Positive for fatigue.  Eyes: Positive for visual disturbance (DOUBLE VISION).   Respiratory:       Snoring  Cardiovascular: Positive for leg swelling.  Gastrointestinal:       Incontinence  Genitourinary: Positive for difficulty urinating.       Incontinence  Neurological: Positive for dizziness, tremors, weakness and numbness.       Memory loss, insomnia, sleepiness  Hematological: Bruises/bleeds easily.  Psychiatric/Behavioral: Positive for hallucinations, confusion and dysphoric mood.    Objective:  Neurologic Exam  Physical Exam Physical  Examination:   Filed Vitals:   05/24/12 1019  BP: 113/65  Pulse: 64  Temp: 97.5 F (36.4 C)    General Examination: The patient is a very pleasant 79 y.o. male in no acute distress.  HEENT: Normocephalic, atraumatic, pupils are equal, round and reactive to light and accommodation. Funduscopic exam is normal with sharp disc margins noted. Extraocular tracking shows mild saccadic breakdown without nystagmus noted. There is limitation to upper gaze. There is moderate decrease in eye blink rate. Hearing is intact. Face is symmetric with moderate facial masking and normal facial sensation. There is no lip, neck or jaw tremor. Neck is moderately rigid with intact passive ROM. There are no carotid bruits on auscultation. Oropharynx exam reveals mild mouth dryness. No significant airway crowding is noted. Mallampati is class II. Tongue protrudes centrally and palate elevates symmetrically.   There is mild drooling.   Chest: is clear to auscultation without wheezing, rhonchi or crackles noted.  Heart: sounds are regular and normal without murmurs, rubs or gallops noted.   Abdomen: is soft, non-tender and non-distended with normal bowel sounds appreciated on auscultation.  Extremities: There is no pitting edema in the distal lower extremities bilaterally.   Skin: is warm and dry with no trophic changes noted.  Musculoskeletal: exam reveals no obvious joint deformities, tenderness or joint swelling or  erythema.  Neurologically:  Mental status: The patient is awake and alert, paying fair  attention. He is unable to partially provide the history. His son and daughter provides details. He is oriented to: person, place, situation and year. His memory, attention, language and knowledge are impaired. There is no aphasia, agnosia, apraxia or anomia. There is a mild degree of bradyphrenia. Speech is moderately hypophonic with mild dysarthria noted. Mood is congruent and affect is normal.  His MMSE score is 24/30. CDT is 2/4. AFT (Animal Fluency Test) score is 15.   Cranial nerves are as described above under HEENT exam. In addition, shoulder shrug is normal with equal shoulder height noted.  Motor exam: Normal bulk, and strength for age is noted. There are no dyskinesias noted. Tone is moderately rigid with presence of cogwheeling in the bilateral upper extremities. There is overall moderate bradykinesia. There is no drift or rebound. There is a moderate resting tremor in the left upper extremity and a no resting tremor in the right extremity. The tremor is intermittent.  Romberg is negative. Reflexes are 1+ in the upper extremities and trace in the lower extremities. Fine motor skills exam reveals: Finger taps are mildly impaired on the right and moderately impaired on the left. Hand movements are mildly impaired on the right and moderately impaired on the left. RAP (rapid alternating patting) is mildly impaired on the right and moderately impaired on the left. Foot taps are moderately impaired on the right and moderately impaired on the left. Foot agility (in the form of heel stomping) is moderately impaired on the right and moderately impaired on the left.    Cerebellar testing shows no dysmetria or intention tremor on finger to nose testing. Heel to shin is unremarkable bilaterally. There is no truncal or gait ataxia.   Sensory exam is intact to light touch, pinprick, vibration, temperature sense and  proprioception in the upper and lower extremities.   Gait, station and balance exan: He stands up from the seated position with severe difficulty and needs marked assistance. No veering to one side is noted. He is not noted to lean to the side. Posture is  moderately stooped. Stance is wide-based. He walks with significant assistance needed and turns and sits with difficulty. He turns in 4 steps. Tandem walk is not possible. Balance is moderately impaired. He is not able to do a toe or heel stance.     Assessment and Plan:   Assessment and Plan:  In summary, Carlos Gonzalez is a very pleasant 79 y.o.-year old male with a history of advanced Parkinson's disease, left-sided predominant and memory loss. I do believe he has progressed in the last 6 months. This is true for his memory loss, his hallucinations but also his motor findings. I a long chat with the patient and his family about my findings and the diagnosis of PD, dementia and the prognosis and treatment options. We talked about medical treatments and non-pharmacological approaches. We talked about the possibility of placing him in memory care rather than continuing with independent living. I do believe that he is at the point where he needs more closer monitoring and care. I did suggest an increase in his levodopa to 4 times daily, namely at 8 AM, 11 AM, 4 PM and 8 PM. I also suggested a trial of Seroquel, starting at 12.5 mg nightly and increasing it in weekly increments to up to 50 mg nightly. I hope that it will help with his sleep and also tone down the hallucinations. I didn't make his son and daughter aware of treating the elderly with dementia with antipsychotic drugs in that area as a description of an increased risk of death by approximately 1.4-1.6 times in the elderly when they were given antipsychotics. However sometimes we have to resort to trying some of these medications and Seroquel has been widely used in this context with very good  success and very good tolerance generally speaking. His son and daughter were in agreement with the plan and I have suggested a followup with soon for checkup since we are making medication changes. I encouraged them to call with any interim questions, concerns, problems, updates or refill requests.

## 2012-05-24 NOTE — Patient Instructions (Addendum)
I do have some generic suggestions for you today:   Please make sure that you drink plenty of fluids. I would like for you to exercise daily for example in the form of walking 20-30 minutes every day, if you can. Please keep a regular sleep-wake schedule, keep regular meal times, do not skip any meals, eat  healthy snacks in between meals, such as fruit or nuts. Try to eat protein with every meal.   Engage in social activities with your family and try to keep up with current events by reading the newspaper or watching the news.   I do think we need to increase your medication at this point: I would like for you to take Carbidopa-Levodopa 25/100 mg, four times a day, at 8 AM, 11 AM, 4PM and 8 PM.  I also want to try you on a medication to help your sleep and your hallucinations: Seroquel 25 mg, take 1/2 pill each night for 1 week, then 1 pill nightly thereafter.   I would like to suggest the possibility that you transfer to the memory care unit at [facility] so they can take better care of your needs.  I want to see you back in 3 months, sooner if we need to.   Please call us if you have any interim questions, concerns, or problems or updates to need to discuss.   Brett Canales is my clinical assistant and will answer any of your questions and relay your messages to me and will give you my messages.   Our phone number is [phone]. We also have an after hours call service for urgent matters and there is a physician on-call for urgent questions. For any emergencies you know to call 911 or go to the nearest emergency room.

## 2012-05-28 ENCOUNTER — Other Ambulatory Visit: Payer: Self-pay

## 2012-05-28 MED ORDER — FLUDROCORTISONE ACETATE 0.1 MG PO TABS: 0.1000 mg | ORAL_TABLET | Freq: Every day | ORAL | Status: DC

## 2012-05-28 NOTE — Telephone Encounter (Signed)
Former [facility] patient assigned to Dr Athar.  

## 2012-05-30 ENCOUNTER — Ambulatory Visit: Payer: Medicare Other | Admitting: Nurse Practitioner

## 2012-06-06 ENCOUNTER — Telehealth: Payer: Self-pay | Admitting: *Deleted

## 2012-06-06 NOTE — Telephone Encounter (Signed)
Called Darl Pikes and left her a message stating that she can get the [facility] form form the patients PCP. Anymore questions she can call the office back.

## 2012-06-06 NOTE — Telephone Encounter (Signed)
Message copied by Ardeth Sportsman on Wed Jun 06, 2012  9:40 AM ------      Message from: Richrd Prime      Created: Mon Jun 04, 2012  3:13 PM      Contact: Teofilo Pod - daughter       Please give Darl Pikes a call--she's telling me her Father needs a RL2 form before he can be transferred to another facility?  Susan's phone number is:  [phone] ------

## 2012-06-08 ENCOUNTER — Ambulatory Visit: Payer: Medicare Other | Admitting: Nurse Practitioner

## 2012-06-15 ENCOUNTER — Telehealth: Payer: Self-pay | Admitting: Neurology

## 2012-06-15 NOTE — Telephone Encounter (Signed)
I called Trip back at [phone] re: [facility] about clarification on Florinef 0.1mg  tab po q daily.  He verbalized understanding.

## 2012-08-12 ENCOUNTER — Encounter (HOSPITAL_COMMUNITY): Payer: Self-pay | Admitting: Emergency Medicine

## 2012-08-12 ENCOUNTER — Emergency Department (HOSPITAL_COMMUNITY)
Admission: EM | Admit: 2012-08-12 | Discharge: 2012-08-12 | Disposition: A | Discharge status: Skilled Nursing Facility | Payer: Medicare Other | Attending: Emergency Medicine | Admitting: Emergency Medicine

## 2012-08-12 ENCOUNTER — Emergency Department (HOSPITAL_COMMUNITY): Payer: Medicare Other

## 2012-08-12 DIAGNOSIS — N481 Balanitis: Secondary | ICD-10-CM

## 2012-08-12 DIAGNOSIS — Z8639 Personal history of other endocrine, nutritional and metabolic disease: Secondary | ICD-10-CM | POA: Insufficient documentation

## 2012-08-12 DIAGNOSIS — Z8679 Personal history of other diseases of the circulatory system: Secondary | ICD-10-CM | POA: Insufficient documentation

## 2012-08-12 DIAGNOSIS — Y939 Activity, unspecified: Secondary | ICD-10-CM | POA: Insufficient documentation

## 2012-08-12 DIAGNOSIS — Z862 Personal history of diseases of the blood and blood-forming organs and certain disorders involving the immune mechanism: Secondary | ICD-10-CM | POA: Insufficient documentation

## 2012-08-12 DIAGNOSIS — G20 Parkinson's disease: Secondary | ICD-10-CM | POA: Insufficient documentation

## 2012-08-12 DIAGNOSIS — S51009A Unspecified open wound of unspecified elbow, initial encounter: Secondary | ICD-10-CM | POA: Insufficient documentation

## 2012-08-12 DIAGNOSIS — S0990XA Unspecified injury of head, initial encounter: Secondary | ICD-10-CM | POA: Insufficient documentation

## 2012-08-12 DIAGNOSIS — N476 Balanoposthitis: Secondary | ICD-10-CM | POA: Insufficient documentation

## 2012-08-12 DIAGNOSIS — R296 Repeated falls: Secondary | ICD-10-CM | POA: Insufficient documentation

## 2012-08-12 DIAGNOSIS — Z85828 Personal history of other malignant neoplasm of skin: Secondary | ICD-10-CM | POA: Insufficient documentation

## 2012-08-12 DIAGNOSIS — Z8701 Personal history of pneumonia (recurrent): Secondary | ICD-10-CM | POA: Insufficient documentation

## 2012-08-12 DIAGNOSIS — I4891 Unspecified atrial fibrillation: Secondary | ICD-10-CM | POA: Insufficient documentation

## 2012-08-12 DIAGNOSIS — I10 Essential (primary) hypertension: Secondary | ICD-10-CM | POA: Insufficient documentation

## 2012-08-12 DIAGNOSIS — IMO0002 Reserved for concepts with insufficient information to code with codable children: Secondary | ICD-10-CM

## 2012-08-12 DIAGNOSIS — Z7901 Long term (current) use of anticoagulants: Secondary | ICD-10-CM | POA: Insufficient documentation

## 2012-08-12 DIAGNOSIS — Y9210 Unspecified residential institution as the place of occurrence of the external cause: Secondary | ICD-10-CM | POA: Insufficient documentation

## 2012-08-12 DIAGNOSIS — Z79899 Other long term (current) drug therapy: Secondary | ICD-10-CM | POA: Insufficient documentation

## 2012-08-12 DIAGNOSIS — Z87448 Personal history of other diseases of urinary system: Secondary | ICD-10-CM | POA: Insufficient documentation

## 2012-08-12 DIAGNOSIS — Z8719 Personal history of other diseases of the digestive system: Secondary | ICD-10-CM | POA: Insufficient documentation

## 2012-08-12 DIAGNOSIS — G20A1 Parkinson's disease without dyskinesia, without mention of fluctuations: Secondary | ICD-10-CM | POA: Insufficient documentation

## 2012-08-12 MED ORDER — CLOTRIMAZOLE 1 % EX CREA: TOPICAL_CREAM | CUTANEOUS | Status: DC

## 2012-08-12 NOTE — ED Notes (Signed)
WUJ:[id number]> Expected date:08/12/12<BR> Expected time: 3:40 AM<BR> Means of arrival:Ambulance<BR> Comments:<BR> Fall  Skin tear to elbow

## 2012-08-12 NOTE — ED Provider Notes (Addendum)
History    CSN: [id number] Arrival date & time 08/12/12  2130  First MD Initiated Contact with Patient 08/12/12 0354     Chief Complaint  Patient presents with  . Fall   (Consider location/radiation/quality/duration/timing/severity/associated sxs/prior Treatment) HPI Comments: Presents today with his after a fall. Patient reports that he fell from a standing position. He scraped his elbow on the way down. He did hit his head, area, but denies headache currently. Patient has neck and back pain. No chest pain or shortness of breath.  Patient is a 79 y.o. male presenting with fall.  Fall Pertinent negatives include no headaches.   Past Medical History  Diagnosis Date  . Parkinson's disease     Dr Avie Echevaria  . Hypertension   . Hypothyroidism     Caused by Amiodarone, resolved  . Orthostatic hypotension     Chronic  . Atrial fibrillation   . Chronic anticoagulation   . BPH (benign prostatic hyperplasia)   . Cancer     hx of skin cancer;? basal cell  . Gastritis   . Shortness of breath   . Pneumonia     hx of   . Blood dyscrasia     chronic anticoagulation   Past Surgical History  Procedure Laterality Date  . Hernia repair       X 4 total  . Appendectomy  Age 58    S/P  rupture  . Knee surgery  Age 49  . US echocardiography  July 2012    EF 55-60%  . Cardiovascular stress test  09-04-2003    EF 66%  . Middle ear surgery     Family History  Problem Relation Age of Onset  . Dementia Mother   . Heart disease Father     CHF  . Heart disease Paternal Grandfather     MI  . Cancer Brother     LUNG  . Heart disease Brother     BY-PASS   History  Substance Use Topics  . Smoking status: Never Smoker   . Smokeless tobacco: Never Used  . Alcohol Use: Yes     Comment: very rarely    Review of Systems  Skin: Positive for wound.  Neurological: Negative for headaches.  All other systems reviewed and are negative.    Allergies  Sulfonamide derivatives  Home  Medications   Current Outpatient Rx  Name  Route  Sig  Dispense  Refill  . carbidopa-levodopa (SINEMET IR) 25-100 MG per tablet   Oral   Take 1 tablet by mouth 4 (four) times daily. 8 am,11 am,4 pm and 8 PM   120 tablet   5   . citalopram (CELEXA) 20 MG tablet   Oral   Take 1 tablet (20 mg total) by mouth daily.   90 tablet   1   . digoxin (LANOXIN) 0.25 MG tablet   Oral   Take 0.25 mg by mouth. 1/2 by mouth daily         . fludrocortisone (FLORINEF) 0.1 MG tablet   Oral   Take 1 tablet (0.1 mg total) by mouth daily.   90 tablet   1   . QUEtiapine (SEROQUEL) 25 MG tablet   Oral   Take 1 tablet (25 mg total) by mouth at bedtime. Take 1/2 pill each night for 1 week, then 1 pill each night thereafter.   30 tablet   5   . warfarin (COUMADIN) 5 MG tablet  5 mg M/W/F, 7.5 mg S/T/TH/S          BP 130/73  Pulse 71  Temp(Src) 97.8 F (36.6 C) (Oral)  Resp 16  SpO2 99% Physical Exam  Constitutional: He is oriented to person, place, and time. He appears well-developed and well-nourished. No distress.  HENT:  Head: Normocephalic.  Right Ear: Hearing normal.  Left Ear: Hearing normal.  Nose: Nose normal.  Mouth/Throat: Oropharynx is clear and moist and mucous membranes are normal.  Neck: Normal range of motion. Neck supple.  Cardiovascular: Regular rhythm, S1 normal and S2 normal.  Exam reveals no gallop and no friction rub.   No murmur heard. Pulmonary/Chest: Effort normal and breath sounds normal. No respiratory distress. He exhibits no tenderness.  Abdominal: Soft. Normal appearance and bowel sounds are normal. There is no hepatosplenomegaly. There is no tenderness. There is no rebound, no guarding, no tenderness at McBurney's point and negative Murphy's sign. No hernia.  Genitourinary: Testes normal.    Circumcised.  Musculoskeletal: Normal range of motion.       Arms: Neurological: He is alert and oriented to person, place, and time. He has normal  strength. No cranial nerve deficit or sensory deficit. Coordination normal. GCS eye subscore is 4. GCS verbal subscore is 5. GCS motor subscore is 6.  Skin: Skin is warm, dry and intact. No rash noted. No cyanosis.  Psychiatric: He has a normal mood and affect. His speech is normal and behavior is normal. Thought content normal.    ED Course  Procedures (including critical care time) Labs Reviewed - No data to display Ct Head Wo Contrast  08/12/2012   *RADIOLOGY REPORT*  Clinical Data:  Injury after fall.  CT HEAD WITHOUT CONTRAST  Technique:  Contiguous axial images were obtained from the base of the skull through the vertex without contrast.  Comparison: 06/09/2011  Findings: The ventricles and sulci are symmetrical without significant effacement, displacement, or dilatation. No mass effect or midline shift. No abnormal extra-axial fluid collections. The grey-white matter junction is distinct. Basal cisterns are not effaced. No acute intracranial hemorrhage. No depressed skull fractures.  Visualized paranasal sinuses and mastoid air cells are not opacified.  No significant changes since the previous study.  IMPRESSION: No acute intracranial abnormalities.   Original Report Authenticated By: Burman Nieves, M.D.    Diagnosis: 1. Skin Tear 2. Balanitis  MDM  Patient comes to the ER after a ground-level fall. Patient has a skin tear on his left elbow, but no bony tenderness of the elbow, normal range of motion. No concern for fracture. He did hit his head, denies headache currently. Because he is on Coumadin, however, CT scan was performed. CT was negative skin tear was cleaned and dressed. Patient was assisted by the nurse to urinate, she noticed that his penis was swollen. Examination revealed some edema just proximal to the glans. We'll treat with clotrimazole.  Gilda Crease, MD 08/12/12 1610  Gilda Crease, MD 08/12/12 [id number]

## 2012-08-12 NOTE — ED Notes (Signed)
Per EMS: Pt from the [place]. Pt fell forward and has skin tear to elbow. No active bleeding. Pt states he did not hit head, no loss of consciousness.

## 2012-08-24 ENCOUNTER — Encounter: Payer: Self-pay | Admitting: Neurology

## 2012-08-24 ENCOUNTER — Ambulatory Visit (INDEPENDENT_AMBULATORY_CARE_PROVIDER_SITE_OTHER): Payer: Medicare Other | Admitting: Neurology

## 2012-08-24 VITALS — BP 139/89 | HR 69 | Ht 69.0 in | Wt 168.0 lb

## 2012-08-24 DIAGNOSIS — I951 Orthostatic hypotension: Secondary | ICD-10-CM

## 2012-08-24 DIAGNOSIS — R413 Other amnesia: Secondary | ICD-10-CM

## 2012-08-24 DIAGNOSIS — R296 Repeated falls: Secondary | ICD-10-CM

## 2012-08-24 DIAGNOSIS — I4891 Unspecified atrial fibrillation: Secondary | ICD-10-CM

## 2012-08-24 DIAGNOSIS — Z9181 History of falling: Secondary | ICD-10-CM

## 2012-08-24 DIAGNOSIS — G20 Parkinson's disease: Secondary | ICD-10-CM

## 2012-08-24 NOTE — Patient Instructions (Addendum)
Remember to drink plenty of fluid, eat healthy meals and do not skip any meals. Try to eat protein with a every meal and eat a healthy snack such as fruit or nuts in between meals. Try to keep a regular sleep-wake schedule and try to exercise daily, particularly in the form of walking, 10-20 minutes a day, if you can.   Use your walker at all times!  As far as your medications are concerned, I would like to suggest no changes.   As far as diagnostic testing: no new test.  I would like to see you back in 3 months, sooner if we need to. Please call us with any interim questions, concerns, problems, updates or refill requests.  Brett Canales is my clinical assistant and will answer any of your questions and relay your messages to me and also relay most of my messages to you.  Our phone number is [phone]. We also have an after hours call service for urgent matters and there is a physician on-call for urgent questions. For any emergencies you know to call 911 or go to the nearest emergency room.

## 2012-08-24 NOTE — Progress Notes (Signed)
Subjective:    Patient ID: Carlos Gonzalez is a 79 y.o. male.  HPI  Interim history:   Mr. Blyth is a very pleasant 73 -year-old right-handed gentleman who presents for followup consultation of his Parkinson's disease. He is accompanied by his daughter and son again today and I first met him on 05/24/2012 and he previously followed with Dr. Fayrene Fearing love and was last seen by him on 11/01/2011. His symptoms date back to 2000 when he noted a new left foot and left leg tremor. He was initially seen at [facility] and 2002 and was started on Requip and Sinemet as well as selegiline. He developed postural hypotension and double vision and unusual behaviors at night as well as excessive daytime somnolence and started having visual hallucinations in November 2011 as well as memory loss. In September 2013 his MMSE was 27, clock drawing was 4 out of 4 and animal fluency was 23. He is legally separated from his wife. At the time of his first visit with me I felt he had left-sided predominant Parkinson's disease, advanced and associated with memory loss and hallucinations as well as postural hypotension. He has been living in independent living and we talked about potential placement and memory care because of felt he needed more closer monitoring and care. I also suggested increasing his levodopa to 4 times a day and suggested a trial of Seroquel low dose at night to help with his sleep and tone down his hallucinations. He has fallen a few times. He is now in Memory Care at [facility] since the first week of May. He fell a couple of weeks ago and was taken to [facility] and he had a CTH, which did not show any overt injury.  He fell again yesterday in the middle of the floor in his room. He has a rolling walker, but may not be using it consistently. He has been more confused recently. He has A fib and is no longer on coumadin and is on ASA, and digoxin. He is no Florinef as well for a Hx of [place]. His memory is  worse. Both his son and his daughter indicate that while motor-wise his tremor seems to be a little better since the increase in levodopa to 4 times a day, his overall balance and his cognitive function are worse. They also indicate that he has had a difficult transition to memory care.  His Past Medical History Is Significant For: Past Medical History  Diagnosis Date  . Parkinson's disease     Dr Avie Echevaria  . Hypertension   . Hypothyroidism     Caused by Amiodarone, resolved  . Orthostatic hypotension     Chronic  . Atrial fibrillation   . Chronic anticoagulation   . BPH (benign prostatic hyperplasia)   . Cancer     hx of skin cancer;? basal cell  . Gastritis   . Shortness of breath   . Pneumonia     hx of   . Blood dyscrasia     chronic anticoagulation    His Past Surgical History Is Significant For: Past Surgical History  Procedure Laterality Date  . Hernia repair       X 4 total  . Appendectomy  Age 48    S/P  rupture  . Knee surgery  Age 3  . US echocardiography  July 2012    EF 55-60%  . Cardiovascular stress test  09-04-2003    EF 66%  . Middle ear surgery  His Family History Is Significant For: Family History  Problem Relation Age of Onset  . Dementia Mother   . Heart disease Father     CHF  . Heart disease Paternal Grandfather     MI  . Cancer Brother     LUNG  . Heart disease Brother     BY-PASS    His Social History Is Significant For: History   Social History  . Marital Status: Legally Separated    Spouse Name: N/A    Number of Children: 2  . Years of Education: Undergrad   Occupational History  . RETIRED    Social History Main Topics  . Smoking status: Never Smoker   . Smokeless tobacco: Never Used  . Alcohol Use: Yes     Comment: very rarely  . Drug Use: No  . Sexually Active: No   Other Topics Concern  . None   Social History Narrative   Patient lives in [place]   DAILY CAFFEINE   NO EXERCISE    His Allergies  Are:  Allergies  Allergen Reactions  . Sulfonamide Derivatives Nausea Only    1957 post knee surgery  :   His Current Medications Are:  Outpatient Encounter Prescriptions as of 08/24/2012  Medication Sig Dispense Refill  . aspirin EC 81 MG tablet Take 81 mg by mouth every morning.      . carbidopa-levodopa (SINEMET IR) 25-100 MG per tablet Take 1 tablet by mouth 4 (four) times daily. 8 am,11 am,4 pm and 8 PM  120 tablet  5  . citalopram (CELEXA) 20 MG tablet Take 20 mg by mouth every morning.      . clotrimazole (LOTRIMIN) 1 % cream Apply to swollen area on penis 2 times daily  15 g  0  . digoxin (LANOXIN) 0.25 MG tablet Take 0.125 mg by mouth every morning. 1/2 by mouth daily      . fludrocortisone (FLORINEF) 0.1 MG tablet Take 0.1 mg by mouth every morning.      Marland Kitchen levothyroxine (SYNTHROID, LEVOTHROID) 25 MCG tablet Take 25 mcg by mouth daily before breakfast.      . LORazepam (ATIVAN) 0.5 MG tablet Take 0.25 mg by mouth every evening.      . nystatin cream (MYCOSTATIN) Apply 1 application topically 2 (two) times daily.      . QUEtiapine (SEROQUEL) 25 MG tablet Take 25 mg by mouth at bedtime.      . selenium sulfide (SELSUN) 1 % LOTN Apply 1 application topically every morning. Wash face and scalp everyday       No facility-administered encounter medications on file as of 08/24/2012.  : Review of Systems  Constitutional: Positive for fatigue.  HENT: Positive for hearing loss and trouble swallowing.   Eyes: Positive for visual disturbance. Photophobia: double vision.  Respiratory: Positive for cough.   Cardiovascular: Positive for leg swelling.  Gastrointestinal:       Incontinence  Endocrine: Positive for cold intolerance (feeling cold).  Genitourinary: Positive for difficulty urinating (urination prob).       Incontinence  Skin: Positive for rash.  Allergic/Immunologic:       Runny nose, skin sensitivity  Neurological: Positive for dizziness, tremors, speech difficulty (slurred  speech) and weakness.       Memory loss, confusion  Hematological: Bruises/bleeds easily.  Psychiatric/Behavioral:       Depression, decreased energy, hallucinations    Objective:  Neurologic Exam  Physical Exam Physical Examination:   Filed Vitals:  08/24/12 1153  BP: 139/89  Pulse: 69    General Examination: The patient is a very pleasant 79 y.o. male in no acute distress. He is calm and cooperative with the exam. He denies Auditory Hallucinations and Visual Hallucinations currently.  HEENT: Normocephalic, atraumatic, pupils are equal, round and reactive to light and accommodation. Extraocular tracking shows mild saccadic breakdown without nystagmus noted. There is limitation to upper gaze. There is moderate decrease in eye blink rate. Hearing is intact. Face is symmetric with moderate facial masking and normal facial sensation. There is no lip, neck or jaw tremor. Neck is moderately rigid with intact passive ROM. There are no carotid bruits on auscultation. Oropharynx exam reveals mild mouth dryness. No significant airway crowding is noted. Mallampati is class II. Tongue protrudes centrally and palate elevates symmetrically.   There is mild drooling.   Chest: is clear to auscultation without wheezing, rhonchi or crackles noted.  Heart: sounds are regular and normal without murmurs, rubs or gallops noted.   Abdomen: is soft, non-tender and non-distended with normal bowel sounds appreciated on auscultation.  Extremities: There is no pitting edema in the distal lower extremities bilaterally.   Skin: is warm and dry with no trophic changes noted.  Musculoskeletal: exam reveals no obvious joint deformities, tenderness or joint swelling or erythema.  Neurologically:  Mental status: The patient is awake and alert, paying fair  attention. He is able to partially provide the history, but at times he seems to talk out of context. His son and daughter provide most of the history. He is  oriented to: person, place, situation and year. His memory, attention, language and knowledge are impaired and seems worse than last time., . There is no aphasia, agnosia, apraxia or anomia. There is a mild degree of bradyphrenia. Speech is moderately hypophonic with mild dysarthria noted. Mood is congruent and affect is normal. From last time: MMSE score is 24/30. CDT is 2/4. AFT (Animal Fluency Test) score is 15.   Cranial nerves are as described above under HEENT exam. In addition, shoulder shrug is normal with equal shoulder height noted.  Motor exam: Normal bulk, and strength for age is noted. There are no dyskinesias noted. Tone is mildly rigid with presence of cogwheeling in the bilateral upper extremities. There is overall moderate bradykinesia. There is no drift or rebound. There is an intermittent mild resting tremor in the left upper extremity and a no resting tremor in the right extremity. The tremor is intermittent.  Romberg is negative. Reflexes are 1+ in the upper extremities and trace in the lower extremities. Fine motor skills exam reveals: Finger taps are mildly impaired on the right and moderately impaired on the left. Hand movements are mildly impaired on the right and moderately impaired on the left. RAP (rapid alternating patting) is mildly impaired on the right and moderately impaired on the left. Foot taps are moderately impaired on the right and moderately impaired on the left. Foot agility (in the form of heel stomping) is moderately impaired on the right and moderately impaired on the left.    Cerebellar testing shows no dysmetria or intention tremor on finger to nose testing. There is no truncal or gait ataxia.   Sensory exam is intact to light touch, pinprick, vibration, temperature sense and proprioception in the upper and lower extremities.   Gait, station and balance exan: He stands up from the seated position with severe difficulty and needs marked assistance provided by  his son. No veering to one  side is noted. He is not noted to lean to the side, but has a tendency to fall backwards. Posture is moderately stooped. Stance is wide-based. He walks with significant assistance needed and turns and sits with difficulty. He turns in 4 steps. Tandem walk is not possible. Balance is moderately impaired. He is not able to do a toe or heel stance.     Assessment and Plan:   Assessment and Plan:  In summary, Treysean R Schear is a very pleasant 79 y.o.-year old male with a history of advanced Parkinson's disease, left-sided predominant, associated with memory loss, urinary incontinence, hallucinations and more falls with a Hx of  orthostatic hypotension. I do believe he has progressed in terms of his memory and his gait dysfunction since last time. I again had a long chat with the patient and his family about my findings and the diagnosis of advanced PD, dementia and the prognosis and treatment options. He is now in Memory care and  while that has been a difficult transition I do believe that he is in the right place as opposed to an independent living. He will require more care and a higher level of supervision and care down the road if he continues to progress at this rate. I have only seen him two times and I believe comparing my findings in April with Dr. Imagene Gurney last note approximately 6 months prior to that and comparing him between April and now, that his cognitive function and his balance and gait have become worse. Tremor-wise and with respect to his rigidity he seems stable or slightly better than last time and I do think we need to continue with levodopa at 4 times a day. He seems to sleep a little better since we started the Seroquel at 25 mg and the hallucinations are stable. I would not want to increase the Sinemet at this time and levodopa will stay at 4 times daily, namely at 8 AM, 11 AM, 4 PM and 8 PM. I talked with them about potentially starting him on donepezil at 5  mg, but given his Hx of hypotension, a fib and hallucinations we mutually decided to hold off for fear of side effects. I encouraged them to call with any interim questions, concerns, problems, updates or refill requests and would like to reevaluate him in 3 months from now, sooner if the need arises.

## 2012-08-28 ENCOUNTER — Other Ambulatory Visit: Payer: Self-pay | Admitting: Dermatology

## 2012-09-12 ENCOUNTER — Telehealth: Payer: Self-pay | Admitting: Neurology

## 2012-12-06 ENCOUNTER — Ambulatory Visit: Payer: Medicare Other | Admitting: Neurology

## 2012-12-07 ENCOUNTER — Ambulatory Visit: Payer: Medicare Other | Admitting: Neurology

## 2012-12-18 ENCOUNTER — Ambulatory Visit: Payer: Medicare Other | Admitting: Neurology

## 2013-01-14 ENCOUNTER — Encounter: Payer: Self-pay | Admitting: Neurology

## 2013-01-14 ENCOUNTER — Ambulatory Visit (INDEPENDENT_AMBULATORY_CARE_PROVIDER_SITE_OTHER): Payer: Medicare Other | Admitting: Neurology

## 2013-01-14 ENCOUNTER — Encounter (INDEPENDENT_AMBULATORY_CARE_PROVIDER_SITE_OTHER): Payer: Self-pay

## 2013-01-14 VITALS — BP 121/67 | HR 59 | Temp 97.5°F | Ht 69.0 in | Wt 172.0 lb

## 2013-01-14 DIAGNOSIS — F0390 Unspecified dementia without behavioral disturbance: Secondary | ICD-10-CM

## 2013-01-14 DIAGNOSIS — I951 Orthostatic hypotension: Secondary | ICD-10-CM

## 2013-01-14 DIAGNOSIS — G20 Parkinson's disease: Secondary | ICD-10-CM

## 2013-01-14 DIAGNOSIS — R296 Repeated falls: Secondary | ICD-10-CM

## 2013-01-14 DIAGNOSIS — I4891 Unspecified atrial fibrillation: Secondary | ICD-10-CM

## 2013-01-14 DIAGNOSIS — Z9181 History of falling: Secondary | ICD-10-CM

## 2013-01-14 NOTE — Patient Instructions (Signed)
Let's continue your medications as before.  Use your walker at all times.  Follow up in 4 months.  Keep your feet up when you sit.

## 2013-01-14 NOTE — Progress Notes (Signed)
Subjective:    Patient ID: Carlos Gonzalez is a 79 y.o. male.  HPI    Interim history:   Mr. Carlos Gonzalez is a very pleasant 79 year old right-handed gentleman, who presents for followup consultation of his advanced, left-sided predominant Parkinson's disease, complicated by memory loss, urinary incontinence, hallucinations, orthostatic hypotension, gait disorder with recurrent falls and evidence of RBD. He is accompanied by his daughter, Darl Pikes, again today and I last saw him on 08/24/2012, at which time we talked about his difficulty transitioning to memory care. He resides at [facility]. I felt that motor-wise he was slightly better or stable compared to his previous visit and the hallucinations were stable on Seroquel. I talked to them about potentially starting Aricept generic but given his history of hypotension, atrial fibrillation, and hallucinations, we mutually decided to hold off for fear of side effects.  Today, he reports no complaints, his daughter reports worse memory function. He states, he is bothered by the lack of independence. He has had fetal incontinence. He does not always use his 4 wheeled walker and did not do well with a 2 wheeled walker.   I first met him on 05/24/2012 and he previously followed with Dr. Avie Echevaria and was last seen by him on 11/01/2011. His symptoms date back to 2000 when he noted a new left foot and left leg tremor. He was initially seen at [facility] in 2002 and was started on Requip and Sinemet as well as selegiline. He developed postural hypotension and double vision and unusual behaviors at night as well as excessive daytime somnolence and started having visual hallucinations in November 2011 as well as memory loss. In September 2013 his MMSE was 27, clock drawing was 4 out of 4 and animal fluency was 23. He is legally separated from his wife. At the time of his first visit with me I felt he had left-sided predominant Parkinson's disease,  advanced and associated with memory loss and hallucinations as well as postural hypotension. He was in independent living at the time and we talked about potential placement and memory care because I felt he needed more closer monitoring and care. I also suggested increasing his levodopa to 4 times a day and suggested a trial of Seroquel low dose at night to help with his sleep and tone down his hallucinations.  He has fallen a few times. He is now in Memory Care at [facility] since May 2014. He fell in July 2014 and was taken to [facility] and he had a CTH, which did not show any overt injury. He has a rolling walker, but may not be using it consistently. He has been more confused recently. He has A fib and is no longer on coumadin and is on ASA, and digoxin and on Florinef for a Hx of OH. His memory, balance and overall cognitive function has been worsening. His son and his daughter indicated that motor-wise his tremor seemed to be a little better since the increase in levodopa to 4 times a day. He has had a difficult transition into memory care.   His Past Medical History Is Significant For: Past Medical History  Diagnosis Date  . Parkinson's disease     Dr Avie Echevaria  . Hypertension   . Hypothyroidism     Caused by Amiodarone, resolved  . Orthostatic hypotension     Chronic  . Atrial fibrillation   . Chronic anticoagulation   . BPH (benign prostatic hyperplasia)   .  Cancer     hx of skin cancer;? basal cell  . Gastritis   . Shortness of breath   . Pneumonia     hx of   . Blood dyscrasia     chronic anticoagulation    His Past Surgical History Is Significant For: Past Surgical History  Procedure Laterality Date  . Hernia repair       X 4 total  . Appendectomy  Age 42    S/P  rupture  . Knee surgery  Age 79  . US echocardiography  July 2012    EF 55-60%  . Cardiovascular stress test  09-04-2003    EF 66%  . Middle ear surgery      His Family History Is Significant  For: Family History  Problem Relation Age of Onset  . Dementia Mother   . Heart disease Father     CHF  . Heart disease Paternal Grandfather     MI  . Cancer Brother     LUNG  . Heart disease Brother     BY-PASS    His Social History Is Significant For: History   Social History  . Marital Status: Legally Separated    Spouse Name: N/A    Number of Children: 2  . Years of Education: Undergrad   Occupational History  . RETIRED    Social History Main Topics  . Smoking status: Never Smoker   . Smokeless tobacco: Never Used  . Alcohol Use: Yes     Comment: very rarely  . Drug Use: No  . Sexual Activity: No   Other Topics Concern  . None   Social History Narrative   Patient lives in [place]   DAILY CAFFEINE   NO EXERCISE    His Allergies Are:  Allergies  Allergen Reactions  . Sulfonamide Derivatives Nausea Only    1957 post knee surgery  :   His Current Medications Are:  Outpatient Encounter Prescriptions as of 01/14/2013  Medication Sig  . aspirin EC 81 MG tablet Take 81 mg by mouth every morning.  . carbidopa-levodopa (SINEMET IR) 25-100 MG per tablet Take 1 tablet by mouth 4 (four) times daily. 8 am,11 am,4 pm and 8 PM  . citalopram (CELEXA) 20 MG tablet Take 20 mg by mouth every morning.  . clotrimazole (LOTRIMIN) 1 % cream Apply to swollen area on penis 2 times daily  . digoxin (LANOXIN) 0.25 MG tablet Take 0.125 mg by mouth every morning. 1/2 by mouth daily  . fludrocortisone (FLORINEF) 0.1 MG tablet Take 0.1 mg by mouth every morning.  Marland Kitchen levothyroxine (SYNTHROID, LEVOTHROID) 25 MCG tablet Take 25 mcg by mouth daily before breakfast.  . loperamide (IMODIUM) 2 MG capsule Take 4 mg by mouth as needed for diarrhea or loose stools.  Marland Kitchen LORazepam (ATIVAN) 0.5 MG tablet Take 0.25 mg by mouth every evening.  . nystatin cream (MYCOSTATIN) Apply 1 application topically 2 (two) times daily.  . QUEtiapine (SEROQUEL) 25 MG tablet Take 25 mg by mouth at bedtime.   . selenium sulfide (SELSUN) 1 % LOTN Apply 1 application topically every morning. Wash face and scalp everyday   Review of Systems:  Out of a complete 14 point review of systems, all are reviewed and negative with the exception of these symptoms as listed below:   Review of Systems  Constitutional: Positive for fatigue.  HENT: Positive for rhinorrhea.   Eyes: Positive for visual disturbance (diplopia).  Respiratory: Negative.   Cardiovascular: Negative.  Gastrointestinal: Positive for diarrhea.       Incontinence  Endocrine: Positive for cold intolerance.  Genitourinary: Positive for frequency and enuresis.  Musculoskeletal: Positive for gait problem.  Skin: Negative.   Neurological: Positive for speech difficulty and weakness.       Memory loss  Hematological: Negative.   Psychiatric/Behavioral: Positive for hallucinations, confusion, sleep disturbance (frequent waking, sleep walking, insomnia) and agitation.    Objective:  Neurologic Exam  Physical Exam Physical Examination:   Filed Vitals:   01/14/13 1508  BP: 121/67  Pulse: 59  Temp: 97.5 F (36.4 C)    General Examination: The patient is a very pleasant 79 y.o. male in no acute distress. He is calm and cooperative with the exam. He denies Auditory Hallucinations and Visual Hallucinations currently.  HEENT: Normocephalic, atraumatic, pupils are equal, round and reactive to light and accommodation. Extraocular tracking shows mild saccadic breakdown without nystagmus noted. There is limitation to upper gaze. There is moderate decrease in eye blink rate. Hearing is intact. Face is symmetric with moderate facial masking and normal facial sensation. There is no lip, neck or jaw tremor. Neck is moderately rigid with intact passive ROM. There are no carotid bruits on auscultation. Oropharynx exam reveals mild mouth dryness. No significant airway crowding is noted. Mallampati is class II. Tongue protrudes centrally and palate  elevates symmetrically.   There is mild drooling.   Chest: is clear to auscultation without wheezing, rhonchi or crackles noted.  Heart: sounds are regular and normal without murmurs, rubs or gallops noted.   Abdomen: is soft, non-tender and non-distended with normal bowel sounds appreciated on auscultation.  Extremities: There is 1+ pitting edema in the distal lower extremities bilaterally, R>L.   Skin: is warm and dry with no trophic changes noted.  Musculoskeletal: exam reveals no obvious joint deformities, tenderness or joint swelling or erythema.  Neurologically:  Mental status: The patient is awake and alert, paying fair  attention. He is able to provide very little history, and at times he seems to talk out of context. His daughter provides most of the history. He is oriented to: person, place, situation and year. His memory, attention, language and knowledge are impaired and worse than last time. There is no aphasia, agnosia, apraxia or anomia. There is a moderate degree of bradyphrenia. Speech is moderately hypophonic with mild dysarthria noted. Mood is congruent and affect is normal.   Cranial nerves are as described above under HEENT exam. In addition, shoulder shrug is normal with equal shoulder height noted.  Motor exam: Normal bulk, and strength for age is noted. There are no dyskinesias noted. Tone is mildly rigid with presence of cogwheeling in the bilateral upper extremities. There is overall moderate bradykinesia. There is no drift or rebound. There is an intermittent mild to moderate resting tremor in the left upper extremity and a no resting tremor in the right extremity. The tremor is intermittent.  Romberg is negative. Reflexes are 1+ in the upper extremities and trace in the lower extremities. Fine motor skills exam reveals: Finger taps are moderately impaired on the right and worse on the left. Hand movements are mildly impaired on the right and moderately impaired on the  left. RAP (rapid alternating patting) is mildly impaired on the right and moderately impaired on the left. Foot taps are moderately impaired on the right and severely on the left. Foot agility (in the form of heel stomping) is moderately impaired on the right and moderately impaired on the left.  Cerebellar testing shows no dysmetria or intention tremor on finger to nose testing. There is no truncal or gait ataxia.   Sensory exam is intact to light touch, pinprick, vibration, temperature sense in the upper and lower extremities.   Gait, station and balance exan: He stands up from the seated position with severe difficulty and needs marked assistance provided by his daughter. No veering to one side is noted. He is not noted to lean to the side, but has a tendency to fall backwards. Posture is moderately stooped. Stance is wide-based. He walks with significant assistance needed and turns and sits with difficulty. He turns in 4 steps. Tandem walk is not possible. Balance is moderately impaired. He is not able to do a toe or heel stance. He did not bring his walker.   Assessment and Plan:   In summary, Carlos Gonzalez is a very pleasant 79 year old male with a history of advanced Parkinson's disease, left-sided predominant, associated with memory loss, urinary incontinence, hallucinations, recurrent falls, and a history of orthostatic hypotension. He has had progression in his memory loss and his motor function and his gait dysfunction since last time. I again had a long chat with the patient and his daughter regarding the difficulties with advanced PD, and the associated motor and nonmotor issues including dementia, hallucinations, blood pressure lability, gait disorder, and overall decline. He has been in Memory care. He has taken a continual decline in motor function as well as his cognitive function. His daughter is reluctant to change his medication regimen and I understand. We do have to be cautious  with any medication changes at this time and at this time we decided not to rock the boat. He will continue with Sinemet 4 times a day and Seroquel 25 mg at night to help him with sleep and to tone down hallucinations. I encouraged them to call with any interim questions, concerns, problems, updates or refill requests and would like to reevaluate him in 6 months from now, sooner if the need arises.

## 2013-03-09 IMAGING — CT CT HEAD W/O CM
2 series · 16 of 30 positions shown, 20 images · non-contrast
Comparison: Head CT 06/07/2011

CLINICAL DATA: Evaluate for stroke.  Complete memory loss for 2
days.

CT HEAD WITHOUT CONTRAST
TECHNIQUE: Contiguous axial images were obtained from the base of
the skull through the vertex without contrast.

[Series 2: head w/o · axial · non-contrast · 0.49mm/px · z∈[+80,+220]mm · 13 of 34 slices shown, 17 images]
[im 3/34  brain]
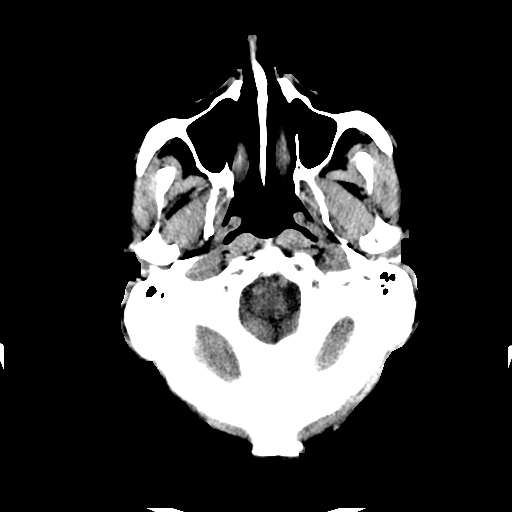
[im 3/34  bone]
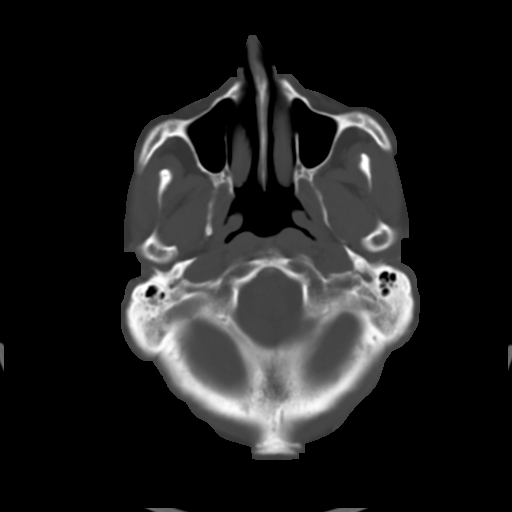
[im 5/34  brain]
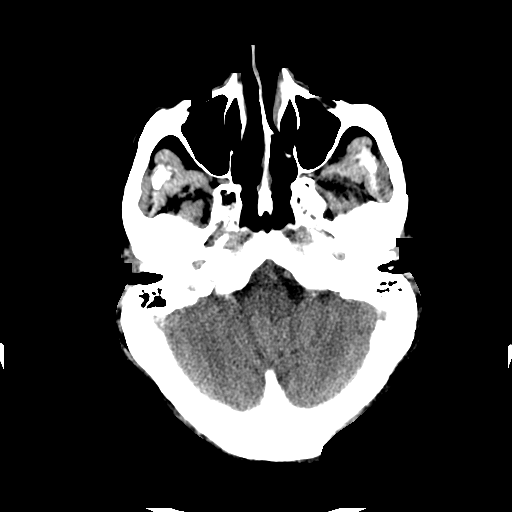
[im 8/34  brain]
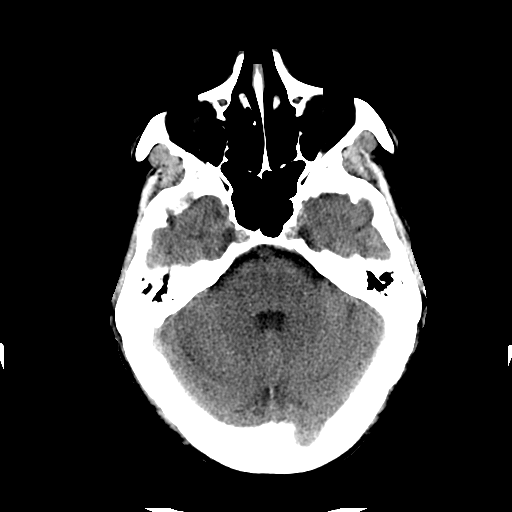
[im 10/34  brain]
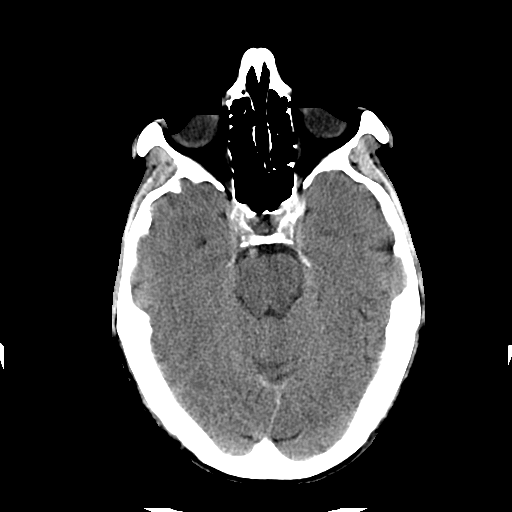
[im 12/34  brain]
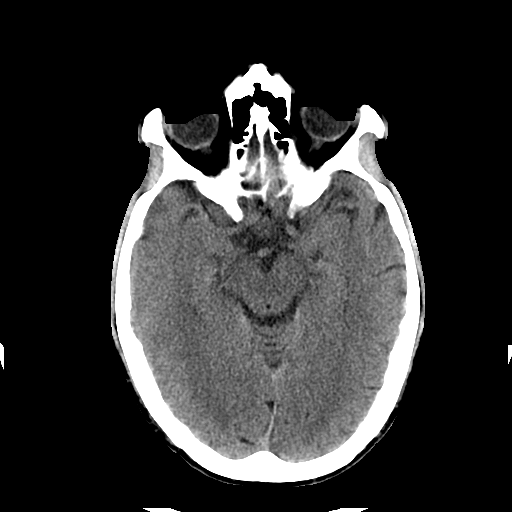
[im 12/34  bone]
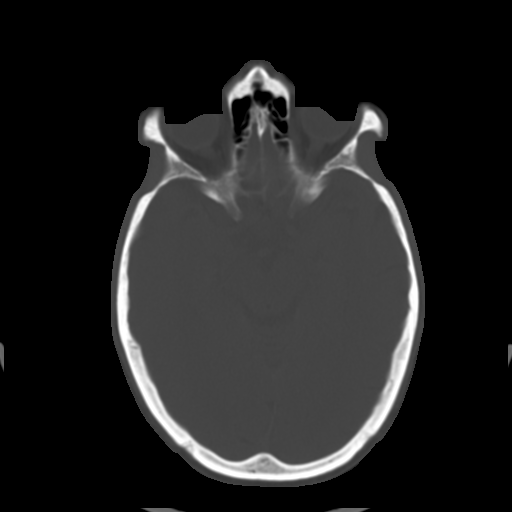
[im 15/34  brain]
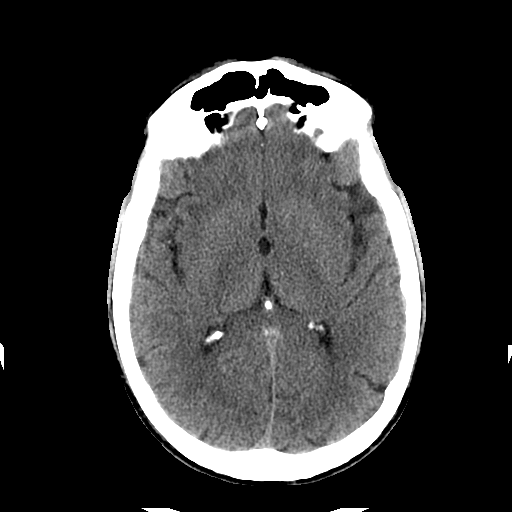
[im 17/34  brain]
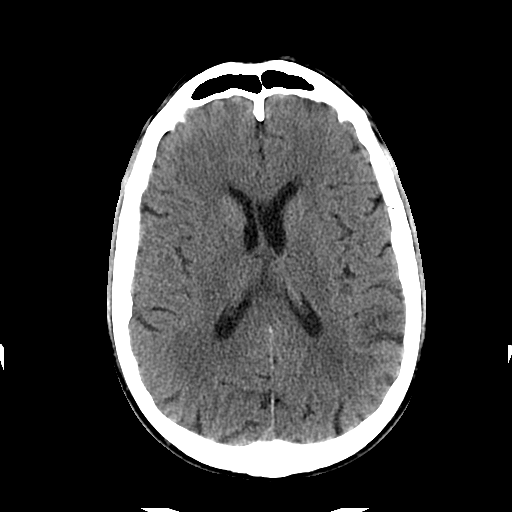
[im 19/34  brain]
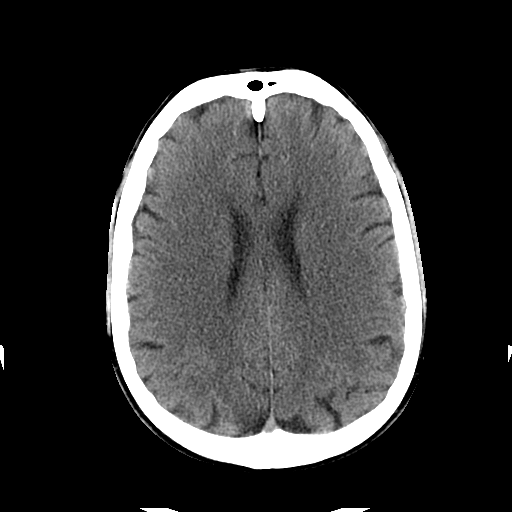
[im 22/34  brain]
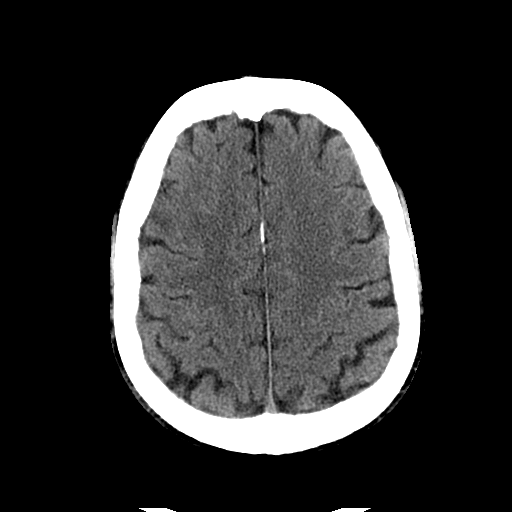
[im 22/34  bone]
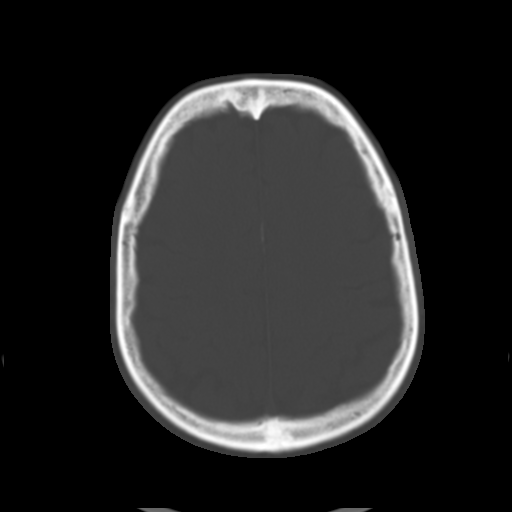
[im 24/34  brain]
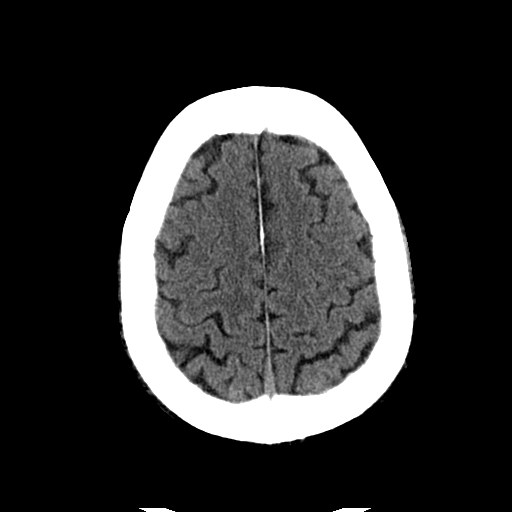
[im 26/34  brain]
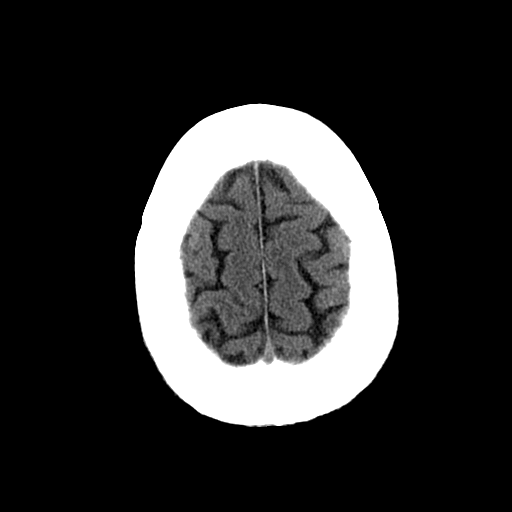
[im 29/34  brain]
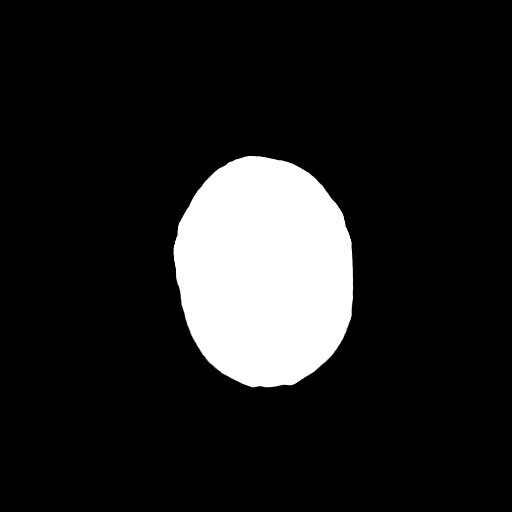
[im 31/34  brain]
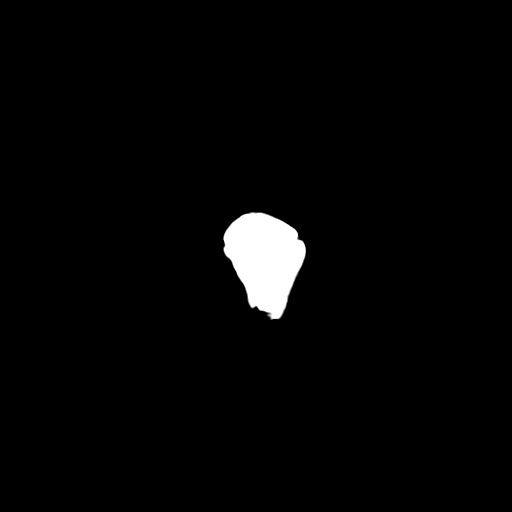
[im 31/34  bone]
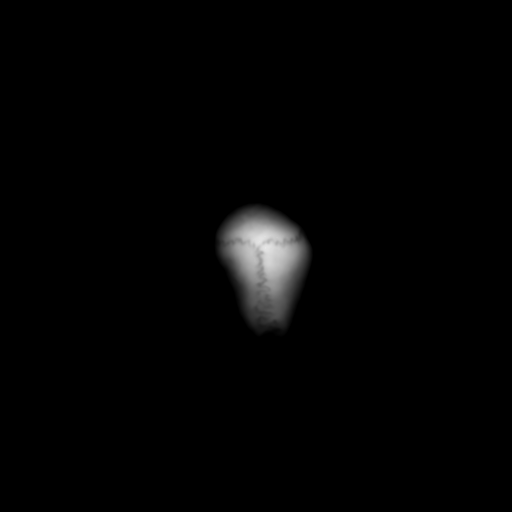

[Series 3: head w/o bone · axial · non-contrast · 0.49mm/px · z∈[+80,+125]mm · 3 of 34 slices shown]
[im 3/34  bone]
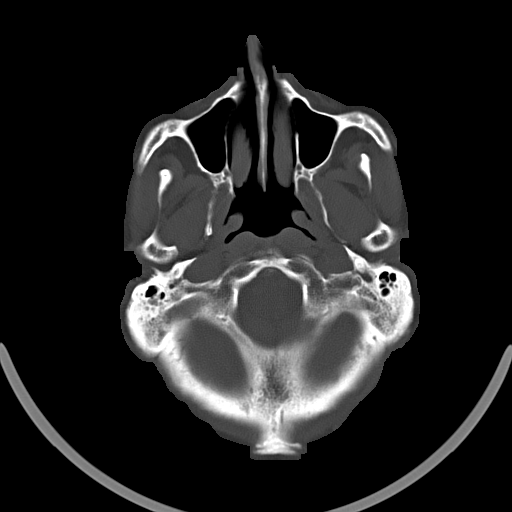
[im 8/34  bone]
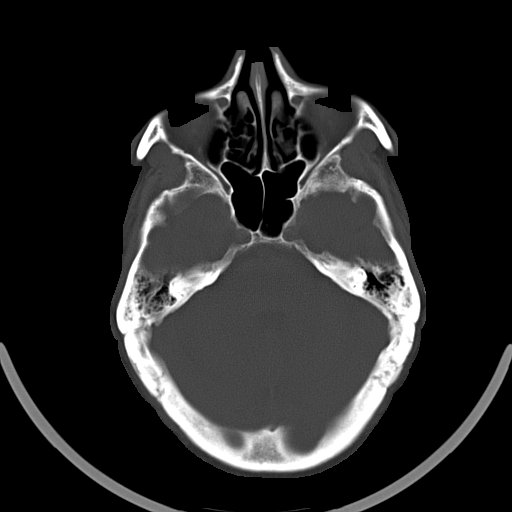
[im 12/34  bone]
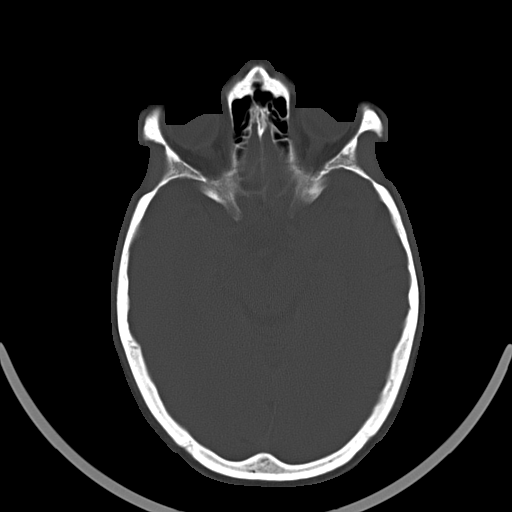

[16 of 30 positions shown; findings below may reference images not displayed]

FINDINGS: No acute intracranial abnormality is identified.  The
cerebral volume is stable.  Ventricles are normal and stable in
size.  Negative for hemorrhage, hydrocephalus, mass effect, mass
lesion, or evidence of acute cortically based infarction.
Atherosclerotic calcification of the intracranial internal carotid
arteries noted.  Left stapes implant again noted.  The paranasal
sinuses and mastoid air cells are clear.  The skull is intact.
Soft tissues of the scalp are symmetric.
IMPRESSION: Stable exam.  No acute intracranial abnormality.

## 2013-04-10 ENCOUNTER — Emergency Department (HOSPITAL_COMMUNITY)
Admission: EM | Admit: 2013-04-10 | Discharge: 2013-04-10 | Disposition: A | Discharge status: Home / Self Care | Payer: Medicare HMO | Attending: Emergency Medicine | Admitting: Emergency Medicine

## 2013-04-10 ENCOUNTER — Encounter (HOSPITAL_COMMUNITY): Payer: Self-pay | Admitting: Emergency Medicine

## 2013-04-10 DIAGNOSIS — G20A1 Parkinson's disease without dyskinesia, without mention of fluctuations: Secondary | ICD-10-CM | POA: Insufficient documentation

## 2013-04-10 DIAGNOSIS — Z85828 Personal history of other malignant neoplasm of skin: Secondary | ICD-10-CM | POA: Insufficient documentation

## 2013-04-10 DIAGNOSIS — Z87448 Personal history of other diseases of urinary system: Secondary | ICD-10-CM | POA: Insufficient documentation

## 2013-04-10 DIAGNOSIS — Z8719 Personal history of other diseases of the digestive system: Secondary | ICD-10-CM | POA: Insufficient documentation

## 2013-04-10 DIAGNOSIS — Z7982 Long term (current) use of aspirin: Secondary | ICD-10-CM | POA: Insufficient documentation

## 2013-04-10 DIAGNOSIS — E039 Hypothyroidism, unspecified: Secondary | ICD-10-CM | POA: Insufficient documentation

## 2013-04-10 DIAGNOSIS — Z79899 Other long term (current) drug therapy: Secondary | ICD-10-CM | POA: Insufficient documentation

## 2013-04-10 DIAGNOSIS — Z8701 Personal history of pneumonia (recurrent): Secondary | ICD-10-CM | POA: Insufficient documentation

## 2013-04-10 DIAGNOSIS — IMO0002 Reserved for concepts with insufficient information to code with codable children: Secondary | ICD-10-CM | POA: Insufficient documentation

## 2013-04-10 DIAGNOSIS — Z862 Personal history of diseases of the blood and blood-forming organs and certain disorders involving the immune mechanism: Secondary | ICD-10-CM | POA: Insufficient documentation

## 2013-04-10 DIAGNOSIS — B349 Viral infection, unspecified: Secondary | ICD-10-CM

## 2013-04-10 DIAGNOSIS — Z7901 Long term (current) use of anticoagulants: Secondary | ICD-10-CM | POA: Insufficient documentation

## 2013-04-10 DIAGNOSIS — B9789 Other viral agents as the cause of diseases classified elsewhere: Secondary | ICD-10-CM | POA: Insufficient documentation

## 2013-04-10 DIAGNOSIS — I10 Essential (primary) hypertension: Secondary | ICD-10-CM | POA: Insufficient documentation

## 2013-04-10 DIAGNOSIS — G20 Parkinson's disease: Secondary | ICD-10-CM | POA: Insufficient documentation

## 2013-04-10 DIAGNOSIS — I4891 Unspecified atrial fibrillation: Secondary | ICD-10-CM | POA: Insufficient documentation

## 2013-04-10 LAB — COMPREHENSIVE METABOLIC PANEL
ALBUMIN: 3.5 g/dL (ref 3.5–5.2)
ALK PHOS: 59 U/L (ref 39–117)
ALT: 7 U/L (ref 0–53)
AST: 22 U/L (ref 0–37)
BILIRUBIN TOTAL: 2.1 mg/dL — AB (ref 0.3–1.2)
BUN: 19 mg/dL (ref 6–23)
CHLORIDE: 104 meq/L (ref 96–112)
CO2: 23 mEq/L (ref 19–32)
Calcium: 8.5 mg/dL (ref 8.4–10.5)
Creatinine, Ser: 0.99 mg/dL (ref 0.50–1.35)
GFR calc Af Amer: 87 mL/min — ABNORMAL LOW (ref >90)
GFR calc non Af Amer: 75 mL/min — ABNORMAL LOW (ref >90)
GLUCOSE: 95 mg/dL (ref 70–99)
POTASSIUM: 3.6 meq/L — AB (ref 3.7–5.3)
SODIUM: 142 meq/L (ref 137–147)
TOTAL PROTEIN: 6 g/dL (ref 6.0–8.3)

## 2013-04-10 LAB — CBC WITH DIFFERENTIAL/PLATELET
BASOS PCT: 0 % (ref 0–1)
Basophils Absolute: 0 10*3/uL (ref 0.0–0.1)
EOS ABS: 0 10*3/uL (ref 0.0–0.7)
Eosinophils Relative: 0 % (ref 0–5)
HCT: 40 % (ref 39.0–52.0)
HEMOGLOBIN: 13.9 g/dL (ref 13.0–17.0)
LYMPHS ABS: 1.1 10*3/uL (ref 0.7–4.0)
Lymphocytes Relative: 20 % (ref 12–46)
MCH: 32.3 pg (ref 26.0–34.0)
MCHC: 34.8 g/dL (ref 30.0–36.0)
MCV: 93 fL (ref 78.0–100.0)
Monocytes Absolute: 0.7 10*3/uL (ref 0.1–1.0)
Monocytes Relative: 14 % — ABNORMAL HIGH (ref 3–12)
NEUTROS ABS: 3.6 10*3/uL (ref 1.7–7.7)
NEUTROS PCT: 66 % (ref 43–77)
PLATELETS: 133 10*3/uL — AB (ref 150–400)
RBC: 4.3 MIL/uL (ref 4.22–5.81)
RDW: 13.2 % (ref 11.5–15.5)
WBC: 5.4 10*3/uL (ref 4.0–10.5)

## 2013-04-10 LAB — TROPONIN I: Troponin I: <0.3 ng/mL (ref <0.30)

## 2013-04-10 LAB — DIGOXIN LEVEL: Digoxin Level: 0.9 ng/mL (ref 0.8–2.0)

## 2013-04-10 LAB — LIPASE, BLOOD: Lipase: 19 U/L (ref 11–59)

## 2013-04-10 MED ORDER — SODIUM CHLORIDE 0.9 % IV SOLN
INTRAVENOUS | Status: DC
  Administered 2013-04-10: 14:00:00 via INTRAVENOUS

## 2013-04-10 MED ORDER — ONDANSETRON HCL 4 MG/2ML IJ SOLN: 4.0000 mg | Freq: Once | INTRAMUSCULAR | Status: DC

## 2013-04-10 NOTE — ED Notes (Signed)
To ED via [facility] from [place] unit of the [facility] with c/o diarrhea x 1 day, and vomiting x 1. Staff at nursing home state that they have other residents with similar symptoms-- pt placed on Enteric precautions on arrival. Pt awake, alert, has some rambling conversation, transferred with 3 assist from EMS stretcher to ED stretcher.

## 2013-04-10 NOTE — Discharge Instructions (Signed)
Continue with the patient's current treatment at that facility. Rotavirus Infection Rotaviruses are a group of viruses that cause acute stomach and bowel upset (gastroenteritis) in all ages. Rotavirus infection may also be called infantile diarrhea, winter diarrhea, acute nonbacterial infectious gastroenteritis, and acute viral gastroenteritis. It occurs especially in young children. Children 6 months to 78 years of age, premature infants, the elderly, and the immunocompromised are more likely to have severe symptoms.  CAUSES  Rotaviruses are transmitted by the fecal-oral route. This means the virus is spread by eating or drinking food or water that is contaminated with infected stool. The virus is most commonly spread from person to person when somone's hands are contaminated with infected stool. For example, infected food handlers may contaminate foods. This can occur with foods that require handling and no further cooking, such as salads, fruits, and hors d'oeuvres. Rotaviruses are quite stable. They can be hard to control and eliminate in water supplies. Rotaviruses are a common cause of infection and diarrhea in child-care settings. SYMPTOMS  Some children have no symptoms. The period after infection but before symptoms begin (incubation period) ranges from 1 to 3 days. Symptoms usually begin with vomiting. Diarrhea follows for 4 to 8 days. Other symptoms may include:  Low-grade fever.  Temporary dairy (lactose) intolerance.  Cough.  Runny nose. DIAGNOSIS  The disease is diagnosed by identifying the virus in the stool. A person with rotavirus diarrhea often has large numbers of viruses in his or her stool. TREATMENT  There is no cure for rotavirus infection. Most people develop an immune response that eventually gets rid of the virus. While this natural response develops, the virus can make you very ill. The majority of people affected are young infants, so the disease can be dangerous. The  most common symptom is diarrhea. Diarrhea alone can cause severe dehydration. It can also cause an electrolyte imbalance. Treatments are aimed at rehydration. Rehydration treatment can prevent the severe effects of dehydration. Antidiarrheal medicines are not recommended. Such medicines may prolong the infection, since they prevent you from passing the viruses out of your body. Severe diarrhea without fluid and electrolyte replacement may be life-threatening. HOME CARE INSTRUCTIONS Ask your caregiver for specific rehydration instructions. SEEK IMMEDIATE MEDICAL CARE IF:   There is decreased urination.  You have a dry mouth, tongue, or lips.  You notice decreased tears or sunken eyes.  You have dry skin.  Your breathing is fast.  Your fingertip takes more than 2 seconds to turn pink again after a gentle squeeze.  There is blood in your vomit or stool.  Your abdomen is enlarged (distended) or very tender.  There is persistent vomiting. Most of this information is courtesy of the Center for Disease Control and Prevention of Food Illness Fact Sheet. Document Released: 01/24/2005 Document Revised: 04/18/2011 Document Reviewed: 04/22/2010 [facility] Patient Information 2014 [place], [place].

## 2013-04-10 NOTE — ED Notes (Signed)
PTR HAS ARRIVED TO TRANSPORT 

## 2013-04-10 NOTE — ED Notes (Signed)
Called ptar to transport pt back to [facility]

## 2013-04-10 NOTE — ED Provider Notes (Addendum)
CSN: [id number]     Arrival date & time 04/10/13  1223 History   First MD Initiated Contact with Patient 04/10/13 1227     Chief Complaint  Patient presents with  . Diarrhea  . Emesis     (Consider location/radiation/quality/duration/timing/severity/associated sxs/prior Treatment) Patient is a 80 y.o. male presenting with diarrhea and vomiting. The history is limited by the condition of the patient.  Diarrhea Associated symptoms: vomiting   Emesis Associated symptoms: diarrhea    patient here with severe diffuse watery diarrhea x2 days with emesis x1. No reported history of fever. Patient is in the memory care unit therefore cannot give me any history at this time. According to nursing home records, he is on loperamide. He also has positive sick exposures at his place of residence. No other history obtainable  Past Medical History  Diagnosis Date  . Parkinson's disease     Dr Morene Antu  . Hypertension   . Hypothyroidism     Caused by Amiodarone, resolved  . Orthostatic hypotension     Chronic  . Atrial fibrillation   . Chronic anticoagulation   . BPH (benign prostatic hyperplasia)   . Cancer     hx of skin cancer;? basal cell  . Gastritis   . Shortness of breath   . Pneumonia     hx of   . Blood dyscrasia     chronic anticoagulation   Past Surgical History  Procedure Laterality Date  . Hernia repair       X 4 total  . Appendectomy  Age 70    S/P  rupture  . Knee surgery  Age 57  . US echocardiography  July 2012    EF 55-60%  . Cardiovascular stress test  09-04-2003    EF 66%  . Middle ear surgery     Family History  Problem Relation Age of Onset  . Dementia Mother   . Heart disease Father     CHF  . Heart disease Paternal Grandfather     MI  . Cancer Brother     LUNG  . Heart disease Brother     BY-PASS   History  Substance Use Topics  . Smoking status: Never Smoker   . Smokeless tobacco: Never Used  . Alcohol Use: No     Comment: very rarely     Review of Systems  Unable to perform ROS Gastrointestinal: Positive for vomiting and diarrhea.      Allergies  Sulfonamide derivatives  Home Medications   Current Outpatient Rx  Name  Route  Sig  Dispense  Refill  . aspirin EC 81 MG tablet   Oral   Take 81 mg by mouth every morning.         . carbidopa-levodopa (SINEMET IR) 25-100 MG per tablet   Oral   Take 1 tablet by mouth 4 (four) times daily. 8 am,11 am,4 pm and 8 PM   120 tablet   5   . citalopram (CELEXA) 20 MG tablet   Oral   Take 20 mg by mouth every morning.         . clotrimazole (LOTRIMIN) 1 % cream      Apply to swollen area on penis 2 times daily   15 g   0   . digoxin (LANOXIN) 0.25 MG tablet   Oral   Take 0.125 mg by mouth every morning. 1/2 by mouth daily         . fludrocortisone (FLORINEF) 0.1  MG tablet   Oral   Take 0.1 mg by mouth every morning.         Marland Kitchen levothyroxine (SYNTHROID, LEVOTHROID) 25 MCG tablet   Oral   Take 25 mcg by mouth daily before breakfast.         . loperamide (IMODIUM) 2 MG capsule   Oral   Take 4 mg by mouth as needed for diarrhea or loose stools.         Marland Kitchen LORazepam (ATIVAN) 0.5 MG tablet   Oral   Take 0.25 mg by mouth every evening.         . nystatin cream (MYCOSTATIN)   Topical   Apply 1 application topically 2 (two) times daily.         . QUEtiapine (SEROQUEL) 25 MG tablet   Oral   Take 25 mg by mouth at bedtime.         . selenium sulfide (SELSUN) 1 % LOTN   Topical   Apply 1 application topically every morning. Wash face and scalp everyday          BP 118/79  Pulse 89  Temp(Src) 97.8 F (36.6 C) (Oral)  Resp 18  SpO2 99% Physical Exam  Nursing note and vitals reviewed. Constitutional: He is oriented to person, place, and time. He appears well-developed and well-nourished.  Non-toxic appearance. No distress.  HENT:  Head: Normocephalic and atraumatic.  Eyes: Conjunctivae, EOM and lids are normal. Pupils are equal,  round, and reactive to light.  Neck: Normal range of motion. Neck supple. No tracheal deviation present. No mass present.  Cardiovascular: Normal rate, regular rhythm and normal heart sounds.  Exam reveals no gallop.   No murmur heard. Pulmonary/Chest: Effort normal and breath sounds normal. No stridor. No respiratory distress. He has no decreased breath sounds. He has no wheezes. He has no rhonchi. He has no rales.  Abdominal: Soft. Normal appearance and bowel sounds are normal. He exhibits no distension. There is no tenderness. There is no rigidity, no rebound, no guarding and no CVA tenderness.  Musculoskeletal: Normal range of motion. He exhibits no edema and no tenderness.  Neurological: He is alert and oriented to person, place, and time. He has normal strength. No cranial nerve deficit or sensory deficit. GCS eye subscore is 4. GCS verbal subscore is 5. GCS motor subscore is 6.  Skin: Skin is warm and dry. No abrasion and no rash noted.  Psychiatric: His affect is blunt. His speech is tangential. He is slowed.    ED Course  Procedures (including critical care time) Labs Review Labs Reviewed  URINE CULTURE  STOOL CULTURE  CBC WITH DIFFERENTIAL  COMPREHENSIVE METABOLIC PANEL  LIPASE, BLOOD  TROPONIN I  DIGOXIN LEVEL  URINALYSIS, ROUTINE W REFLEX MICROSCOPIC  ROTAVIRUS ANTIGEN, STOOL  GI PATHOGEN PANEL BY PCR, STOOL   Imaging Review No results found.   EKG Interpretation None      MDM   Final diagnoses:  None   IV fluids here and has made urine as noted in his diaper. Attempted to contact pts daughter without success. Pt without stool here. No diarrhea noted here. Electrolytes are stable. He is already on Lomotil for his diarrhea. Given Zofran for nausea no emesis here. Will be discharged back to his facility     Leota Jacobsen, MD 04/10/13 [place], MD 04/10/13 [phone]

## 2013-04-10 NOTE — ED Notes (Signed)
Butch Penny, RN at [facility] pt has not been on any antibiotics recently, has had diarrhea all day yesterday-- watery, also had vomiting this weekend. Pt is weaker today than normal, has needed help walking, sleeping more. [phone].

## 2013-04-10 NOTE — ED Notes (Signed)
CALLED [facility] AND THEY ADVISE PT IS NEXT ON THE LIST FOR TRANSPORT

## 2013-05-09 NOTE — Telephone Encounter (Signed)
Closing encounter

## 2013-05-21 ENCOUNTER — Emergency Department (HOSPITAL_COMMUNITY)
Admission: EM | Admit: 2013-05-21 | Discharge: 2013-05-22 | Disposition: A | Discharge status: Home / Self Care | Payer: Medicare HMO | Attending: Emergency Medicine | Admitting: Emergency Medicine

## 2013-05-21 ENCOUNTER — Encounter (HOSPITAL_COMMUNITY): Payer: Self-pay | Admitting: Emergency Medicine

## 2013-05-21 ENCOUNTER — Emergency Department (HOSPITAL_COMMUNITY): Payer: Medicare HMO

## 2013-05-21 DIAGNOSIS — Z8719 Personal history of other diseases of the digestive system: Secondary | ICD-10-CM | POA: Insufficient documentation

## 2013-05-21 DIAGNOSIS — Z8701 Personal history of pneumonia (recurrent): Secondary | ICD-10-CM | POA: Insufficient documentation

## 2013-05-21 DIAGNOSIS — W19XXXA Unspecified fall, initial encounter: Secondary | ICD-10-CM

## 2013-05-21 DIAGNOSIS — Z85828 Personal history of other malignant neoplasm of skin: Secondary | ICD-10-CM | POA: Insufficient documentation

## 2013-05-21 DIAGNOSIS — Z7901 Long term (current) use of anticoagulants: Secondary | ICD-10-CM | POA: Insufficient documentation

## 2013-05-21 DIAGNOSIS — G20A1 Parkinson's disease without dyskinesia, without mention of fluctuations: Secondary | ICD-10-CM | POA: Insufficient documentation

## 2013-05-21 DIAGNOSIS — S0990XA Unspecified injury of head, initial encounter: Secondary | ICD-10-CM

## 2013-05-21 DIAGNOSIS — Z87448 Personal history of other diseases of urinary system: Secondary | ICD-10-CM | POA: Insufficient documentation

## 2013-05-21 DIAGNOSIS — G20 Parkinson's disease: Secondary | ICD-10-CM | POA: Insufficient documentation

## 2013-05-21 DIAGNOSIS — Y9389 Activity, other specified: Secondary | ICD-10-CM | POA: Insufficient documentation

## 2013-05-21 DIAGNOSIS — Z79899 Other long term (current) drug therapy: Secondary | ICD-10-CM | POA: Insufficient documentation

## 2013-05-21 DIAGNOSIS — I10 Essential (primary) hypertension: Secondary | ICD-10-CM | POA: Insufficient documentation

## 2013-05-21 DIAGNOSIS — Z7982 Long term (current) use of aspirin: Secondary | ICD-10-CM | POA: Insufficient documentation

## 2013-05-21 DIAGNOSIS — Y9210 Unspecified residential institution as the place of occurrence of the external cause: Secondary | ICD-10-CM | POA: Insufficient documentation

## 2013-05-21 DIAGNOSIS — W1809XA Striking against other object with subsequent fall, initial encounter: Secondary | ICD-10-CM | POA: Insufficient documentation

## 2013-05-21 DIAGNOSIS — IMO0002 Reserved for concepts with insufficient information to code with codable children: Secondary | ICD-10-CM | POA: Insufficient documentation

## 2013-05-21 DIAGNOSIS — E039 Hypothyroidism, unspecified: Secondary | ICD-10-CM | POA: Insufficient documentation

## 2013-05-21 DIAGNOSIS — I4891 Unspecified atrial fibrillation: Secondary | ICD-10-CM | POA: Insufficient documentation

## 2013-05-21 LAB — CBC WITH DIFFERENTIAL/PLATELET
Basophils Absolute: 0 10*3/uL (ref 0.0–0.1)
Basophils Relative: 0 % (ref 0–1)
EOS PCT: 3 % (ref 0–5)
Eosinophils Absolute: 0.1 10*3/uL (ref 0.0–0.7)
HEMATOCRIT: 34.3 % — AB (ref 39.0–52.0)
Hemoglobin: 11 g/dL — ABNORMAL LOW (ref 13.0–17.0)
LYMPHS ABS: 1.3 10*3/uL (ref 0.7–4.0)
LYMPHS PCT: 29 % (ref 12–46)
MCH: 31.2 pg (ref 26.0–34.0)
MCHC: 32.1 g/dL (ref 30.0–36.0)
MCV: 97.2 fL (ref 78.0–100.0)
MONO ABS: 0.6 10*3/uL (ref 0.1–1.0)
MONOS PCT: 13 % — AB (ref 3–12)
Neutro Abs: 2.6 10*3/uL (ref 1.7–7.7)
Neutrophils Relative %: 55 % (ref 43–77)
Platelets: 178 10*3/uL (ref 150–400)
RBC: 3.53 MIL/uL — AB (ref 4.22–5.81)
RDW: 13.9 % (ref 11.5–15.5)
WBC: 4.7 10*3/uL (ref 4.0–10.5)

## 2013-05-21 NOTE — ED Notes (Signed)
Per EMS pt from [facility], had an unwitnessed fall up against dresser, pt said he hit his head on floor, left side, denied LOC, denies pain, passed spinal assessment, here for evaluation. BP 130/90, HR 92, RR 18, CBG 129.

## 2013-05-21 NOTE — ED Notes (Signed)
Pt states he fell and bounced on the floor, states was laying flat on floor, not able to tell what happen, denies pain.

## 2013-05-21 NOTE — ED Notes (Signed)
Bed: UY23 Expected date:  Expected time:  Means of arrival:  Comments: EMS 80yo Fall

## 2013-05-21 NOTE — Progress Notes (Signed)
CSW met with patient at bedside without any family.  Patient present a little drowsy, slight mumbling, and some confusion.  Patient was able to answer some questions appropriately.  He reports that he will be returning to [facility] once medically cleared.  Patient reports that when he first arrived staff contacted his family.  By the end of the conversation the patient appeared confused unlike the initial presentation.      Chesley Noon, MSW, [place], 05/21/2013 Evening Clinical Social Worker [phone]

## 2013-05-21 NOTE — ED Provider Notes (Signed)
TIME SEEN: 11:05 PM  CHIEF COMPLAINT: Fall, head injury  HPI: Patient is an 80 year old male with history of Parkinson's, hypertension, hypothyroidism, A. fib not on anticoagulation who presents to the emergency department with a fall today. He lives at [facility] [place] in his fall was unwitnessed. He states he hit his head on the floor but denies loss of consciousness. Denies any current pain. He states he is not sure why he fell but denies any chest pain, shortness of breath, dizziness or palpitations. No recent cough, vomiting or diarrhea. No numbness, tingling or focal weakness.  ROS: See HPI Constitutional: no fever  Eyes: no drainage  ENT: no runny nose   Cardiovascular:  no chest pain  Resp: no SOB  GI: no vomiting GU: no dysuria Integumentary: no rash  Allergy: no hives  Musculoskeletal: no leg swelling  Neurological: no slurred speech ROS otherwise negative  PAST MEDICAL HISTORY/PAST SURGICAL HISTORY:  Past Medical History  Diagnosis Date  . Parkinson's disease     Dr Morene Antu  . Hypertension   . Hypothyroidism     Caused by Amiodarone, resolved  . Orthostatic hypotension     Chronic  . Atrial fibrillation   . Chronic anticoagulation   . BPH (benign prostatic hyperplasia)   . Cancer     hx of skin cancer;? basal cell  . Gastritis   . Shortness of breath   . Pneumonia     hx of   . Blood dyscrasia     chronic anticoagulation    MEDICATIONS:  Prior to Admission medications   Medication Sig Start Date End Date Taking? Authorizing Provider  aspirin 81 MG chewable tablet Chew 81 mg by mouth daily.   Yes Historical Provider, MD  carbidopa-levodopa (SINEMET IR) 25-100 MG per tablet Take 1 tablet by mouth 4 (four) times daily. 9am, 1pm, 5pm, and 9pm 05/24/12 05/24/13 Yes Star Age, MD  citalopram (CELEXA) 20 MG tablet Take 20 mg by mouth every morning.   Yes Historical Provider, MD  digoxin (LANOXIN) 0.125 MG tablet Take 0.125 mg by mouth daily.   Yes Historical  Provider, MD  diphenhydramine-acetaminophen (TYLENOL PM) 25-500 MG TABS Take 1 tablet by mouth at bedtime as needed (sleep).   Yes Historical Provider, MD  ENSURE (ENSURE) Take 1 Can by mouth 3 (three) times daily - between meals and at bedtime.   Yes Historical Provider, MD  fludrocortisone (FLORINEF) 0.1 MG tablet Take 0.1 mg by mouth every morning.   Yes Historical Provider, MD  levothyroxine (SYNTHROID, LEVOTHROID) 50 MCG tablet Take 50 mcg by mouth daily before breakfast.   Yes Historical Provider, MD  loperamide (IMODIUM) 2 MG capsule Take 2-4 mg by mouth every 6 (six) hours as needed for diarrhea or loose stools. Take 2 capsules by mouth after first loose stool then take 1 capsule by mouth every 6 hours as needed for additional loose stools. Do not exceed 6 capsules in 24 hours.   Yes Historical Provider, MD  LORazepam (ATIVAN) 0.5 MG tablet Take 0.25 mg by mouth at bedtime.    Yes Historical Provider, MD  QUEtiapine (SEROQUEL) 25 MG tablet Take 25 mg by mouth at bedtime. 05/24/12  Yes Star Age, MD  selenium sulfide (SELSUN) 1 % LOTN Apply 1 application topically every morning. Wash face and scalp everyday   Yes Historical Provider, MD    ALLERGIES:  Allergies  Allergen Reactions  . Sulfonamide Derivatives Nausea Only    1957 post knee surgery    SOCIAL  HISTORY:  History  Substance Use Topics  . Smoking status: Never Smoker   . Smokeless tobacco: Never Used  . Alcohol Use: No     Comment: very rarely    FAMILY HISTORY: Family History  Problem Relation Age of Onset  . Dementia Mother   . Heart disease Father     CHF  . Heart disease Paternal Grandfather     MI  . Cancer Brother     LUNG  . Heart disease Brother     BY-PASS    EXAM: BP 140/91  Pulse 83  Temp(Src) 98.7 F (37.1 C) (Oral)  Resp 18  SpO2 99% CONSTITUTIONAL: Alert and oriented and responds appropriately to questions. Well-appearing; well-nourished; GCS 81, elderly, in no apparent distress HEAD:  Normocephalic; atraumatic EYES: Conjunctivae clear, PERRL, EOMI ENT: normal nose; no rhinorrhea; moist mucous membranes; pharynx without lesions noted; no dental injury; ; no septal hematoma NECK: Supple, no meningismus, no LAD; no midline spinal tenderness, step-off or deformity CARD: RRR; S1 and S2 appreciated; no murmurs, no clicks, no rubs, no gallops RESP: Normal chest excursion without splinting or tachypnea; breath sounds clear and equal bilaterally; no wheezes, no rhonchi, no rales; chest wall stable, nontender to palpation ABD/GI: Normal bowel sounds; non-distended; soft, non-tender, no rebound, no guarding PELVIS:  stable, nontender to palpation BACK:  The back appears normal and is non-tender to palpation, there is no CVA tenderness; no midline spinal tenderness, step-off or deformity EXT: Normal ROM in all joints; non-tender to palpation; no edema; normal capillary refill; no cyanosis    SKIN: Normal color for age and race; warm NEURO: Moves all extremities equally, sensation to light touch intact diffusely, cranial nerves II through XII intact PSYCH: The patient's mood and manner are appropriate. Grooming and personal hygiene are appropriate.  MEDICAL DECISION MAKING: Patient here with unwitnessed fall. We'll obtain basic labs, urine, CT imaging of his head and cervical spine.  He is hemodynamically stable, neurologically intact with no complaints of pain. No signs of trauma on exam.  ED PROGRESS: Patient's labs are unremarkable. Urine shows no sign of infection. Head and cervical spine CT are also negative. His INR is 1.10. We'll discharge patient home with head injury return precautions and supportive care instructions. Patient verbalizes understanding and is comfortable with this plan.     EKG Interpretation  Date/Time:  Tuesday May 21 2013 21:58:56 EDT Ventricular Rate:  70 PR Interval:    QRS Duration: 87 QT Interval:  410 QTC Calculation: 442 R Axis:   63 Text  Interpretation:  Atrial fibrillation No significant change since last tracing Confirmed by Trysten Bernard,  DO, Jesusa Stenerson [phone]) on 05/21/2013 11:20:48 PM           [place], DO 05/22/13 [id number]

## 2013-05-22 LAB — BASIC METABOLIC PANEL
BUN: 21 mg/dL (ref 6–23)
CALCIUM: 8.4 mg/dL (ref 8.4–10.5)
CO2: 29 meq/L (ref 19–32)
CREATININE: 1.14 mg/dL (ref 0.50–1.35)
Chloride: 105 mEq/L (ref 96–112)
GFR calc Af Amer: 68 mL/min — ABNORMAL LOW (ref >90)
GFR calc non Af Amer: 58 mL/min — ABNORMAL LOW (ref >90)
GLUCOSE: 102 mg/dL — AB (ref 70–99)
Potassium: 4.3 mEq/L (ref 3.7–5.3)
Sodium: 141 mEq/L (ref 137–147)

## 2013-05-22 LAB — URINALYSIS, ROUTINE W REFLEX MICROSCOPIC
Bilirubin Urine: NEGATIVE
GLUCOSE, UA: NEGATIVE mg/dL
HGB URINE DIPSTICK: NEGATIVE
Ketones, ur: NEGATIVE mg/dL
Leukocytes, UA: NEGATIVE
Nitrite: NEGATIVE
PROTEIN: NEGATIVE mg/dL
Specific Gravity, Urine: 1.008 (ref 1.005–1.030)
Urobilinogen, UA: 1 mg/dL (ref 0.0–1.0)
pH: 7 (ref 5.0–8.0)

## 2013-05-22 LAB — PROTIME-INR
INR: 1.1 (ref 0.00–1.49)
Prothrombin Time: 14 seconds (ref 11.6–15.2)

## 2013-05-22 NOTE — Discharge Instructions (Signed)
Head Injury, Adult °You have received a head injury. It does not appear serious at this time. Headaches and vomiting are common following head injury. It should be easy to awaken from sleeping. Sometimes it is necessary for you to stay in the emergency department for a while for observation. Sometimes admission to the hospital may be needed. After injuries such as yours, most problems occur within the first 24 hours, but side effects may occur up to 7 10 days after the injury. It is important for you to carefully monitor your condition and contact your health care provider or seek immediate medical care if there is a change in your condition. °WHAT ARE THE TYPES OF HEAD INJURIES? °Head injuries can be as minor as a bump. Some head injuries can be more severe. More severe head injuries include: °· A jarring injury to the brain (concussion). °· A bruise of the brain (contusion). This mean there is bleeding in the brain that can cause swelling. °· A cracked skull (skull fracture). °· Bleeding in the brain that collects, clots, and forms a bump (hematoma). °WHAT CAUSES A HEAD INJURY? °A serious head injury is most likely to happen to someone who is in a car wreck and is not wearing a seat belt. Other causes of major head injuries include bicycle or motorcycle accidents, sports injuries, and falls. °HOW ARE HEAD INJURIES DIAGNOSED? °A complete history of the event leading to the injury and your current symptoms will be helpful in diagnosing head injuries. Many times, pictures of the brain, such as CT or MRI are needed to see the extent of the injury. Often, an overnight hospital stay is necessary for observation.  °WHEN SHOULD I SEEK IMMEDIATE MEDICAL CARE?  °You should get help right away if: °· You have confusion or drowsiness. °· You feel sick to your stomach (nauseous) or have continued, forceful vomiting. °· You have dizziness or unsteadiness that is getting worse. °· You have severe, continued headaches not  relieved by medicine. Only take over-the-counter or prescription medicines for pain, fever, or discomfort as directed by your health care provider. °· You do not have normal function of the arms or legs or are unable to walk. °· You notice changes in the black spots in the center of the colored part of your eye (pupil). °· You have a clear or bloody fluid coming from your nose or ears. °· You have a loss of vision. °During the next 24 hours after the injury, you must stay with someone who can watch you for the warning signs. This person should contact local emergency services (911 in the [place] if you have seizures, you become unconscious, or you are unable to wake up. °HOW CAN I PREVENT A HEAD INJURY IN THE FUTURE? °The most important factor for preventing major head injuries is avoiding motor vehicle accidents.  To minimize the potential for damage to your head, it is crucial to wear seat belts while riding in motor vehicles. Wearing helmets while bike riding and playing collision sports (like football) is also helpful. Also, avoiding dangerous activities around the house will further help reduce your risk of head injury.  °WHEN CAN I RETURN TO NORMAL ACTIVITIES AND ATHLETICS? °You should be reevaluated by your health care provider before returning to these activities. If you have any of the following symptoms, you should not return to activities or contact sports until 1 week after the symptoms have stopped: °· Persistent headache. °· Dizziness or vertigo. °· Poor attention and concentration. °·   Confusion.  Memory problems.  Nausea or vomiting.  Fatigue or tire easily.  Irritability.  Intolerant of bright lights or loud noises.  Anxiety or depression.  Disturbed sleep. MAKE SURE YOU:   Understand these instructions.  Will watch your condition.  Will get help right away if you are not doing well or get worse. Document Released: 01/24/2005 Document Revised: 11/14/2012 Document Reviewed:  10/01/2012 [facility] Patient Information 2014 [place]. Fall Prevention and Home Safety Falls cause injuries and can affect all age groups. It is possible to use preventive measures to significantly decrease the likelihood of falls. There are many simple measures which can make your home safer and prevent falls. OUTDOORS  Repair cracks and edges of walkways and driveways.  Remove high doorway thresholds.  Trim shrubbery on the main path into your home.  Have good outside lighting.  Clear walkways of tools, rocks, debris, and clutter.  Check that handrails are not broken and are securely fastened. Both sides of steps should have handrails.  Have leaves, snow, and ice cleared regularly.  Use sand or salt on walkways during winter months.  In the garage, clean up grease or oil spills. BATHROOM  Install night lights.  Install grab bars by the toilet and in the tub and shower.  Use non-skid mats or decals in the tub or shower.  Place a plastic non-slip stool in the shower to sit on, if needed.  Keep floors dry and clean up all water on the floor immediately.  Remove soap buildup in the tub or shower on a regular basis.  Secure bath mats with non-slip, double-sided rug tape.  Remove throw rugs and tripping hazards from the floors. BEDROOMS  Install night lights.  Make sure a bedside light is easy to reach.  Do not use oversized bedding.  Keep a telephone by your bedside.  Have a firm chair with side arms to use for getting dressed.  Remove throw rugs and tripping hazards from the floor. KITCHEN  Keep handles on pots and pans turned toward the center of the stove. Use back burners when possible.  Clean up spills quickly and allow time for drying.  Avoid walking on wet floors.  Avoid hot utensils and knives.  Position shelves so they are not too high or low.  Place commonly used objects within easy reach.  If necessary, use a sturdy step stool with a  grab bar when reaching.  Keep electrical cables out of the way.  Do not use floor polish or wax that makes floors slippery. If you must use wax, use non-skid floor wax.  Remove throw rugs and tripping hazards from the floor. STAIRWAYS  Never leave objects on stairs.  Place handrails on both sides of stairways and use them. Fix any loose handrails. Make sure handrails on both sides of the stairways are as long as the stairs.  Check carpeting to make sure it is firmly attached along stairs. Make repairs to worn or loose carpet promptly.  Avoid placing throw rugs at the top or bottom of stairways, or properly secure the rug with carpet tape to prevent slippage. Get rid of throw rugs, if possible.  Have an electrician put in a light switch at the top and bottom of the stairs. OTHER FALL PREVENTION TIPS  Wear low-heel or rubber-soled shoes that are supportive and fit well. Wear closed toe shoes.  When using a stepladder, make sure it is fully opened and both spreaders are firmly locked. Do not climb a  closed stepladder.  Add color or contrast paint or tape to grab bars and handrails in your home. Place contrasting color strips on first and last steps.  Learn and use mobility aids as needed. Install an electrical emergency response system.  Turn on lights to avoid dark areas. Replace light bulbs that burn out immediately. Get light switches that glow.  Arrange furniture to create clear pathways. Keep furniture in the same place.  Firmly attach carpet with non-skid or double-sided tape.  Eliminate uneven floor surfaces.  Select a carpet pattern that does not visually hide the edge of steps.  Be aware of all pets. OTHER HOME SAFETY TIPS  Set the water temperature for 120 F (48.8 C).  Keep emergency numbers on or near the telephone.  Keep smoke detectors on every level of the home and near sleeping areas. Document Released: 01/14/2002 Document Revised: 07/26/2011 Document  Reviewed: 04/15/2011 [facility] Patient Information 2014 [place].

## 2013-05-29 ENCOUNTER — Other Ambulatory Visit: Payer: Self-pay | Admitting: Neurology

## 2013-05-29 NOTE — Telephone Encounter (Signed)
Okay to renew.  Star Age, MD, PhD [facility])

## 2013-05-29 NOTE — Telephone Encounter (Signed)
Dr Erling Cruz had been prescribing this for the patient.  Would you like to fill, or should patient request from PCP/Other provider.  Please advise.  Thank you.

## 2013-06-15 ENCOUNTER — Emergency Department (HOSPITAL_COMMUNITY)
Admission: EM | Admit: 2013-06-15 | Discharge: 2013-06-15 | Disposition: A | Discharge status: Home / Self Care | Payer: Medicare HMO | Attending: Emergency Medicine | Admitting: Emergency Medicine

## 2013-06-15 ENCOUNTER — Emergency Department (HOSPITAL_COMMUNITY): Payer: Medicare HMO

## 2013-06-15 ENCOUNTER — Encounter (HOSPITAL_COMMUNITY): Payer: Self-pay | Admitting: Emergency Medicine

## 2013-06-15 DIAGNOSIS — G20 Parkinson's disease: Secondary | ICD-10-CM | POA: Insufficient documentation

## 2013-06-15 DIAGNOSIS — Y9389 Activity, other specified: Secondary | ICD-10-CM | POA: Insufficient documentation

## 2013-06-15 DIAGNOSIS — F0280 Dementia in other diseases classified elsewhere without behavioral disturbance: Secondary | ICD-10-CM | POA: Insufficient documentation

## 2013-06-15 DIAGNOSIS — S32009A Unspecified fracture of unspecified lumbar vertebra, initial encounter for closed fracture: Secondary | ICD-10-CM | POA: Insufficient documentation

## 2013-06-15 DIAGNOSIS — Z79899 Other long term (current) drug therapy: Secondary | ICD-10-CM | POA: Insufficient documentation

## 2013-06-15 DIAGNOSIS — I4891 Unspecified atrial fibrillation: Secondary | ICD-10-CM | POA: Insufficient documentation

## 2013-06-15 DIAGNOSIS — Z7982 Long term (current) use of aspirin: Secondary | ICD-10-CM | POA: Insufficient documentation

## 2013-06-15 DIAGNOSIS — I10 Essential (primary) hypertension: Secondary | ICD-10-CM | POA: Insufficient documentation

## 2013-06-15 DIAGNOSIS — Y9210 Unspecified residential institution as the place of occurrence of the external cause: Secondary | ICD-10-CM | POA: Insufficient documentation

## 2013-06-15 DIAGNOSIS — Z8701 Personal history of pneumonia (recurrent): Secondary | ICD-10-CM | POA: Insufficient documentation

## 2013-06-15 DIAGNOSIS — Z859 Personal history of malignant neoplasm, unspecified: Secondary | ICD-10-CM | POA: Insufficient documentation

## 2013-06-15 DIAGNOSIS — Z87448 Personal history of other diseases of urinary system: Secondary | ICD-10-CM | POA: Insufficient documentation

## 2013-06-15 DIAGNOSIS — Z7901 Long term (current) use of anticoagulants: Secondary | ICD-10-CM | POA: Insufficient documentation

## 2013-06-15 DIAGNOSIS — Z8719 Personal history of other diseases of the digestive system: Secondary | ICD-10-CM | POA: Insufficient documentation

## 2013-06-15 DIAGNOSIS — W19XXXA Unspecified fall, initial encounter: Secondary | ICD-10-CM

## 2013-06-15 DIAGNOSIS — E039 Hypothyroidism, unspecified: Secondary | ICD-10-CM | POA: Insufficient documentation

## 2013-06-15 DIAGNOSIS — W1809XA Striking against other object with subsequent fall, initial encounter: Secondary | ICD-10-CM | POA: Insufficient documentation

## 2013-06-15 DIAGNOSIS — G20A1 Parkinson's disease without dyskinesia, without mention of fluctuations: Secondary | ICD-10-CM | POA: Insufficient documentation

## 2013-06-15 DIAGNOSIS — S32010A Wedge compression fracture of first lumbar vertebra, initial encounter for closed fracture: Secondary | ICD-10-CM

## 2013-06-15 DIAGNOSIS — IMO0002 Reserved for concepts with insufficient information to code with codable children: Secondary | ICD-10-CM | POA: Insufficient documentation

## 2013-06-15 HISTORY — DX: Unspecified dementia, unspecified severity, without behavioral disturbance, psychotic disturbance, mood disturbance, and anxiety: F03.90

## 2013-06-15 NOTE — ED Provider Notes (Signed)
CSN: [id number]     Arrival date & time 06/15/13  1734 History   First MD Initiated Contact with Patient 06/15/13 1754     Chief Complaint  Patient presents with  . Fall     (Consider location/radiation/quality/duration/timing/severity/associated sxs/prior Treatment) Patient is a 80 y.o. male presenting with fall. The history is provided by the patient, a relative and medical records. No language interpreter was used.  [place] is a 80 y.o. male  with a hx of parkinson's disease, dementia, HTN, a-fib (not anticoagulated) presents to the Emergency Department via EMS from [facility]. Per EMS [facility] reported that patient fell onto his bottom while attempting to sit in a chair.  Pt reports he hit his head when he fell, but denies back pain.  He is unable to remember the events under which he fell.  Pt currently denies all pain.  Level 5 caveat for dementia.      Past Medical History  Diagnosis Date  . Parkinson's disease     Dr Morene Antu  . Hypertension   . Hypothyroidism     Caused by Amiodarone, resolved  . Orthostatic hypotension     Chronic  . Atrial fibrillation   . Chronic anticoagulation   . BPH (benign prostatic hyperplasia)   . Cancer     hx of skin cancer;? basal cell  . Gastritis   . Shortness of breath   . Pneumonia     hx of   . Blood dyscrasia     chronic anticoagulation  . Dementia    Past Surgical History  Procedure Laterality Date  . Hernia repair       X 4 total  . Appendectomy  Age 40    S/P  rupture  . Knee surgery  Age 67  . US echocardiography  July 2012    EF 55-60%  . Cardiovascular stress test  09-04-2003    EF 66%  . Middle ear surgery     Family History  Problem Relation Age of Onset  . Dementia Mother   . Heart disease Father     CHF  . Heart disease Paternal Grandfather     MI  . Cancer Brother     LUNG  . Heart disease Brother     BY-PASS   History  Substance Use Topics  . Smoking status: Never  Smoker   . Smokeless tobacco: Never Used  . Alcohol Use: No     Comment: very rarely    Review of Systems  Unable to perform ROS: Dementia      Allergies  Sulfonamide derivatives  Home Medications   Prior to Admission medications   Medication Sig Start Date End Date Taking? Authorizing Provider  aspirin 81 MG chewable tablet Chew 81 mg by mouth daily.   Yes Historical Provider, MD  carbidopa-levodopa (SINEMET IR) 25-100 MG per tablet Take 1 tablet by mouth 4 (four) times daily.   Yes Historical Provider, MD  citalopram (CELEXA) 20 MG tablet Take 20 mg by mouth every morning.   Yes Historical Provider, MD  digoxin (LANOXIN) 0.125 MG tablet Take 0.125 mg by mouth daily.   Yes Historical Provider, MD  diphenhydramine-acetaminophen (TYLENOL PM) 25-500 MG TABS Take 1 tablet by mouth at bedtime as needed (sleep).   Yes Historical Provider, MD  ENSURE (ENSURE) Take 1 Can by mouth 3 (three) times daily - between meals and at bedtime.   Yes Historical Provider, MD  fludrocortisone (  FLORINEF) 0.1 MG tablet Take 0.1 mg by mouth daily.   Yes Historical Provider, MD  levothyroxine (SYNTHROID, LEVOTHROID) 50 MCG tablet Take 50 mcg by mouth daily before breakfast.   Yes Historical Provider, MD  loperamide (IMODIUM) 2 MG capsule Take 2-4 mg by mouth every 6 (six) hours as needed for diarrhea or loose stools. Take 2 capsules by mouth after first loose stool then take 1 capsule by mouth every 6 hours as needed for additional loose stools. Do not exceed 6 capsules in 24 hours.   Yes Historical Provider, MD  LORazepam (ATIVAN) 0.5 MG tablet Take 0.25 mg by mouth at bedtime.    Yes Historical Provider, MD  QUEtiapine (SEROQUEL) 25 MG tablet Take 25 mg by mouth at bedtime. 05/24/12  Yes Star Age, MD  selenium sulfide (SELSUN) 1 % LOTN Apply 1 application topically every morning. Wash face and scalp everyday   Yes Historical Provider, MD   BP 121/78  Pulse 72  Temp(Src) 98.5 F (36.9 C) (Oral)  Resp  14  SpO2 99% Physical Exam  Nursing note and vitals reviewed. Constitutional: He appears well-developed and well-nourished. No distress.  Awake, alert, nontoxic appearance  HENT:  Head: Normocephalic and atraumatic.  Mouth/Throat: Oropharynx is clear and moist. No oropharyngeal exudate.  No contusions, abrasions, or erythema anywhere on the head.    Eyes: Conjunctivae and EOM are normal. Pupils are equal, round, and reactive to light. No scleral icterus.  Neck: Normal range of motion. Neck supple.  Full ROM without pain No midline or paraspinal tenderness   Cardiovascular: Normal rate, regular rhythm, normal heart sounds and intact distal pulses.   No murmur heard. Pulmonary/Chest: Effort normal and breath sounds normal. No respiratory distress. He has no wheezes.  No contusions No deformity, crepitus or flail segment  Abdominal: Soft. Bowel sounds are normal. He exhibits no distension and no mass. There is no tenderness. There is no rebound and no guarding.  Soft and nontender on exam No contusion or ecchymosis  Musculoskeletal: Normal range of motion. He exhibits no edema.  Mild tenderness to palpation of the spinous processes of the upper L-spine, but no TTP of the t-spine; No tenderness to palpation of the paraspinous muscles of the L-spine; no tenderness to the coccyx No contusions, swelling, step-offs or deformities of the C-spine, T-spine or L-spine  Lymphadenopathy:    He has no cervical adenopathy.  Neurological: He is alert. He has normal reflexes.  Pt alert and oriented to person only Moves extremities without ataxia Sensation grossly intact to all extremities  Skin: Skin is warm and dry. No rash noted. He is not diaphoretic. No erythema.  No skin tears or lacerations  Psychiatric: He has a normal mood and affect. His behavior is normal.    ED Course  Procedures (including critical care time) Labs Review Labs Reviewed - No data to display  Imaging Review Dg Lumbar  Spine Complete  06/15/2013   CLINICAL DATA:  Low back pain  EXAM: LUMBAR SPINE - COMPLETE 4+ VIEW  COMPARISON:  None.  FINDINGS: Compression deformity involving L1 age indeterminate of the magnitude of 30 to 35% vertebral body height loss. There is no appreciable cortical irregularity. No further evidence of fracture or dislocation. Degenerative disc disease changes at L3-4 and L5-S1.  IMPRESSION: Compression deformity involving L1 age indeterminate  Degenerative disc disease changes L3-4 L5-S1.   Electronically Signed   By: Margaree Mackintosh M.D.   On: 06/15/2013 19:03   Dg Sacrum/coccyx  06/15/2013  CLINICAL DATA:  FALL  EXAM: SACRUM AND COCCYX - 2+ VIEW  COMPARISON:  None.  FINDINGS: There is no evidence of fracture or other focal bone lesions osteoarthritic changes appreciated within the hips. Degenerative disc disease changes at L5-S1.  IMPRESSION: No acute osseous abnormalities. Degenerative disc disease L5-S1 and osteoarthritic changes within the right left hips.   Electronically Signed   By: Margaree Mackintosh M.D.   On: 06/15/2013 19:04   Ct Head Wo Contrast  06/15/2013   CLINICAL DATA:  Fall.  EXAM: CT HEAD WITHOUT CONTRAST  CT CERVICAL SPINE WITHOUT CONTRAST  TECHNIQUE: Multidetector CT imaging of the head and cervical spine was performed following the standard protocol without intravenous contrast. Multiplanar CT image reconstructions of the cervical spine were also generated.  COMPARISON:  05/21/2013  FINDINGS: CT HEAD FINDINGS  The ventricles, cisterns and other CSF spaces are within normal. There is no mass, mass effect, shift of midline structures or acute hemorrhage. There is no evidence of acute infarction. There is mild chronic ischemic microvascular disease. Remaining bones soft tissues are unremarkable.  CT CERVICAL SPINE FINDINGS  Vertebral body alignment and heights are within normal. There is mild spondylosis throughout the cervical spine. There is minimal disc space narrowing at the C4-5 and  C5-6 levels unchanged. Prevertebral soft tissues are within normal. There is moderate uncovertebral joint spurring and facet arthropathy. The atlantoaxial articulation is within normal. There is no acute fracture or subluxation. There is bilateral neural foraminal narrowing at multiple levels due to bony spurring. Remainder the exam is unremarkable  IMPRESSION: No acute intracranial findings.  No acute cervical spine injury.  Mild chronic ischemic microvascular disease.  Spondylosis of the cervical spine with disc disease at the C4-5 and C5-6 levels. Multilevel neural foraminal narrowing.   Electronically Signed   By: Marin Olp M.D.   On: 06/15/2013 19:21   Ct Cervical Spine Wo Contrast  06/15/2013   CLINICAL DATA:  Fall.  EXAM: CT HEAD WITHOUT CONTRAST  CT CERVICAL SPINE WITHOUT CONTRAST  TECHNIQUE: Multidetector CT imaging of the head and cervical spine was performed following the standard protocol without intravenous contrast. Multiplanar CT image reconstructions of the cervical spine were also generated.  COMPARISON:  05/21/2013  FINDINGS: CT HEAD FINDINGS  The ventricles, cisterns and other CSF spaces are within normal. There is no mass, mass effect, shift of midline structures or acute hemorrhage. There is no evidence of acute infarction. There is mild chronic ischemic microvascular disease. Remaining bones soft tissues are unremarkable.  CT CERVICAL SPINE FINDINGS  Vertebral body alignment and heights are within normal. There is mild spondylosis throughout the cervical spine. There is minimal disc space narrowing at the C4-5 and C5-6 levels unchanged. Prevertebral soft tissues are within normal. There is moderate uncovertebral joint spurring and facet arthropathy. The atlantoaxial articulation is within normal. There is no acute fracture or subluxation. There is bilateral neural foraminal narrowing at multiple levels due to bony spurring. Remainder the exam is unremarkable  IMPRESSION: No acute  intracranial findings.  No acute cervical spine injury.  Mild chronic ischemic microvascular disease.  Spondylosis of the cervical spine with disc disease at the C4-5 and C5-6 levels. Multilevel neural foraminal narrowing.   Electronically Signed   By: Marin Olp M.D.   On: 06/15/2013 19:21     EKG Interpretation None      MDM   Final diagnoses:  Compression fracture of L1 lumbar vertebra  Fall   Creed R Pickerill presents from [facility] after [facility].  I have tried several times with several different phone numbers and have been unable to reach anyone at [facility].    6:15 PM Pt daughter reports this same incident has happened one time per week for the last 4 weeks.  Pt was going to sit in the chair for dinner and missed the chair, falling onto his bottom.  She reports that he was complaining of low back pain after the fall, prompting his ED visit.  Pt's daughter reports that he has not been evaluated for the previous falls.    7:26 PM CT head and neck without acute abnormality.  L-spine plain films with Compression deformity involving L1 age indeterminate of the magnitude of 30 to 35% vertebral body height loss.  I personally reviewed the imaging tests through PACS system.  I reviewed available ER/hospitalization records through the EMR.  It has been determined that no acute conditions requiring further emergency intervention are present at this time. The patient/guardian have been advised of the diagnosis and plan. We have discussed signs and symptoms that warrant return to the ED, such as changes or worsening in symptoms.   Vital signs are stable at discharge.   BP 121/78  Pulse 72  Temp(Src) 98.5 F (36.9 C) (Oral)  Resp 14  SpO2 99%  Patient/guardian has voiced understanding and agreed to follow-up with the PCP or specialist.   The patient was discussed with and seen by Dr. Winfred Leeds who agrees with the treatment plan.   Jarrett Soho Ailyne Pawley, PA-C 06/15/13  2104

## 2013-06-15 NOTE — Discharge Instructions (Signed)
1. Medications: tylenol for pain control, usual home medications 2. Treatment: rest, drink plenty of fluids,  3. Follow Up: Please followup with your primary doctor within 1 week for discussion of your diagnoses and further evaluation after today's visit;     Back, Compression Fracture A compression fracture happens when a force is put upon the length of your spine. Slipping and falling on your bottom are examples of such a force. When this happens, sometimes the force is great enough to compress the building blocks (vertebral bodies) of your spine. Although this causes a lot of pain, this can usually be treated at home, unless your caregiver feels hospitalization is needed for pain control. Your backbone (spinal column) is made up of 24 main vertebral bodies in addition to the sacrum and coccyx (see illustration). These are held together by tough fibrous tissues (ligaments) and by support of your muscles. Nerve roots pass through the openings between the vertebrae. A sudden wrenching move, injury, or a fall may cause a compression fracture of one of the vertebral bodies. This may result in back pain or spread of pain into the belly (abdomen), the buttocks, and down the leg into the foot. Pain may also be created by muscle spasm alone. Large studies have been undertaken to determine the best possible course of action to help your back following injury and also to prevent future problems. The recommendations are as follows. FOLLOWING A COMPRESSION FRACTURE: Do the following only if advised by your caregiver.   If a back brace has been suggested or provided, wear it as directed.  DO NOT stop wearing the back brace unless instructed by your caregiver.  When allowed to return to regular activities, avoid a sedentary life style. Actively exercise. Sporadic weekend binges of tennis, racquetball, water skiing, may actually aggravate or create problems, especially if you are not in condition for that  activity.  Avoid sports requiring sudden body movements until you are in condition for them. Swimming and walking are safer activities.  Maintain good posture.  Avoid obesity.  If not already done, you should have a DEXA scan. Based on the results, be treated for osteoporosis. FOLLOWING ACUTE (SUDDEN) INJURY:  Only take over-the-counter or prescription medicines for pain, discomfort, or fever as directed by your caregiver.  Use bed rest for only the most extreme acute episode. Prolonged bed rest may aggravate your condition. Ice used for acute conditions is effective. Use a large plastic bag filled with ice. Wrap it in a towel. This also provides excellent pain relief. This may be continuous. Or use it for 30 minutes every 2 hours during acute phase, then as needed. Heat for 30 minutes prior to activities is helpful.  As soon as the acute phase (the time when your back is too painful for you to do normal activities) is over, it is important to resume normal activities and work [employer] information centre manager. Back injuries can cause potentially marked changes in lifestyle. So it is important to attack these problems aggressively.  See your caregiver for continued problems. He or she can help or refer you for appropriate exercises, physical therapy and work hardening if needed.  If you are given narcotic medications for your condition, for the next 24 hours DO NOT:  Drive  Operate machinery or power tools.  Sign legal documents.  DO NOT drink alcohol, take sleeping pills or other medications that may interfere with treatment. If your caregiver has given you a follow-up appointment, it is very important to keep  that appointment. Not keeping the appointment could result in a chronic or permanent injury, pain, and disability. If there is any problem keeping the appointment, you must call back to this facility for assistance.  SEEK IMMEDIATE MEDICAL CARE IF:  You develop numbness, tingling, weakness, or  problems with the use of your arms or legs.  You develop severe back pain not relieved with medications.  You have changes in bowel or bladder control.  You have increasing pain in any areas of the body. Document Released: 01/24/2005 Document Revised: 04/18/2011 Document Reviewed: 08/29/2007 [facility] Patient Information 2014 [place].

## 2013-06-15 NOTE — ED Provider Notes (Signed)
Medical screening examination/treatment/procedure(s) were conducted as a shared visit with non-physician practitioner(s) and myself.  I personally evaluated the patient during the encounter.   EKG Interpretation None       Orlie Dakin, MD 06/15/13 2356

## 2013-06-15 NOTE — ED Notes (Signed)
Pt reports hit back of head on the floor when he fell. Denies any pain at this time, head not tender to palpation, no deformities noted.

## 2013-06-15 NOTE — ED Provider Notes (Signed)
Level V caveat. Patient demented patient reportedly fell today while in the chair. He does not complaining of pain anywhere. Patient is alert follows simple commands moves all extremities. HEENT exam normocephalic atraumatic entire spine is nontender pelvis stable nontender. All 4 extremities without deformity or tenderness. Neurologic cranial nerves II through XII intact moves all extremities motor strength 5 over 5 overall  Orlie Dakin, MD 06/15/13 [phone]

## 2013-06-15 NOTE — ED Notes (Signed)
Per EMS: Pt missed chair when sitting down for dinner, fell onto bottom. Pt not complaining of any pain. No injuries noted.

## 2013-06-15 NOTE — ED Notes (Signed)
[facility] called for transportation to [facility]

## 2013-06-15 NOTE — ED Notes (Signed)
Bed: WT88 Expected date:  Expected time:  Means of arrival:  Comments: EMS fell on bottom

## 2013-06-16 ENCOUNTER — Emergency Department (HOSPITAL_COMMUNITY): Payer: Medicare HMO

## 2013-06-16 ENCOUNTER — Encounter (HOSPITAL_COMMUNITY): Payer: Self-pay | Admitting: Emergency Medicine

## 2013-06-16 ENCOUNTER — Emergency Department (HOSPITAL_COMMUNITY)
Admission: EM | Admit: 2013-06-16 | Discharge: 2013-06-16 | Disposition: A | Discharge status: Home / Self Care | Payer: Medicare HMO | Attending: Emergency Medicine | Admitting: Emergency Medicine

## 2013-06-16 DIAGNOSIS — R296 Repeated falls: Secondary | ICD-10-CM | POA: Insufficient documentation

## 2013-06-16 DIAGNOSIS — F0390 Unspecified dementia without behavioral disturbance: Secondary | ICD-10-CM

## 2013-06-16 DIAGNOSIS — Y9389 Activity, other specified: Secondary | ICD-10-CM | POA: Insufficient documentation

## 2013-06-16 DIAGNOSIS — Z7901 Long term (current) use of anticoagulants: Secondary | ICD-10-CM | POA: Insufficient documentation

## 2013-06-16 DIAGNOSIS — Y9210 Unspecified residential institution as the place of occurrence of the external cause: Secondary | ICD-10-CM | POA: Insufficient documentation

## 2013-06-16 DIAGNOSIS — G20 Parkinson's disease: Secondary | ICD-10-CM | POA: Insufficient documentation

## 2013-06-16 DIAGNOSIS — W19XXXA Unspecified fall, initial encounter: Secondary | ICD-10-CM

## 2013-06-16 DIAGNOSIS — G20A1 Parkinson's disease without dyskinesia, without mention of fluctuations: Secondary | ICD-10-CM | POA: Insufficient documentation

## 2013-06-16 DIAGNOSIS — Z859 Personal history of malignant neoplasm, unspecified: Secondary | ICD-10-CM | POA: Insufficient documentation

## 2013-06-16 DIAGNOSIS — Z8719 Personal history of other diseases of the digestive system: Secondary | ICD-10-CM | POA: Insufficient documentation

## 2013-06-16 DIAGNOSIS — Z87448 Personal history of other diseases of urinary system: Secondary | ICD-10-CM | POA: Insufficient documentation

## 2013-06-16 DIAGNOSIS — Z862 Personal history of diseases of the blood and blood-forming organs and certain disorders involving the immune mechanism: Secondary | ICD-10-CM | POA: Insufficient documentation

## 2013-06-16 DIAGNOSIS — IMO0002 Reserved for concepts with insufficient information to code with codable children: Secondary | ICD-10-CM | POA: Insufficient documentation

## 2013-06-16 DIAGNOSIS — I10 Essential (primary) hypertension: Secondary | ICD-10-CM | POA: Insufficient documentation

## 2013-06-16 DIAGNOSIS — F0280 Dementia in other diseases classified elsewhere without behavioral disturbance: Secondary | ICD-10-CM | POA: Insufficient documentation

## 2013-06-16 DIAGNOSIS — Z79899 Other long term (current) drug therapy: Secondary | ICD-10-CM | POA: Insufficient documentation

## 2013-06-16 DIAGNOSIS — Z043 Encounter for examination and observation following other accident: Secondary | ICD-10-CM | POA: Insufficient documentation

## 2013-06-16 DIAGNOSIS — Z8701 Personal history of pneumonia (recurrent): Secondary | ICD-10-CM | POA: Insufficient documentation

## 2013-06-16 DIAGNOSIS — Z7982 Long term (current) use of aspirin: Secondary | ICD-10-CM | POA: Insufficient documentation

## 2013-06-16 DIAGNOSIS — I4891 Unspecified atrial fibrillation: Secondary | ICD-10-CM | POA: Insufficient documentation

## 2013-06-16 NOTE — Discharge Instructions (Signed)
If you were given medicines take as directed.  If you are on coumadin or contraceptives realize their levels and effectiveness is altered by many different medicines.  If you have any reaction (rash, tongues swelling, other) to the medicines stop taking and see a physician.   Please follow up as directed and return to the ER or see a physician for new or worsening symptoms.  Thank you. Filed Vitals:   06/16/13 0155  BP: 129/81  Pulse: 79  Temp: 97.7 F (36.5 C)  TempSrc: Oral  Resp: 16  SpO2: 100%    Fall Prevention and Home Safety Falls cause injuries and can affect all age groups. It is possible to use preventive measures to significantly decrease the likelihood of falls. There are many simple measures which can make your home safer and prevent falls. OUTDOORS  Repair cracks and edges of walkways and driveways.  Remove high doorway thresholds.  Trim shrubbery on the main path into your home.  Have good outside lighting.  Clear walkways of tools, rocks, debris, and clutter.  Check that handrails are not broken and are securely fastened. Both sides of steps should have handrails.  Have leaves, snow, and ice cleared regularly.  Use sand or salt on walkways during winter months.  In the garage, clean up grease or oil spills. BATHROOM  Install night lights.  Install grab bars by the toilet and in the tub and shower.  Use non-skid mats or decals in the tub or shower.  Place a plastic non-slip stool in the shower to sit on, if needed.  Keep floors dry and clean up all water on the floor immediately.  Remove soap buildup in the tub or shower on a regular basis.  Secure bath mats with non-slip, double-sided rug tape.  Remove throw rugs and tripping hazards from the floors. BEDROOMS  Install night lights.  Make sure a bedside light is easy to reach.  Do not use oversized bedding.  Keep a telephone by your bedside.  Have a firm chair with side arms to use for  getting dressed.  Remove throw rugs and tripping hazards from the floor. KITCHEN  Keep handles on pots and pans turned toward the center of the stove. Use back burners when possible.  Clean up spills quickly and allow time for drying.  Avoid walking on wet floors.  Avoid hot utensils and knives.  Position shelves so they are not too high or low.  Place commonly used objects within easy reach.  If necessary, use a sturdy step stool with a grab bar when reaching.  Keep electrical cables out of the way.  Do not use floor polish or wax that makes floors slippery. If you must use wax, use non-skid floor wax.  Remove throw rugs and tripping hazards from the floor. STAIRWAYS  Never leave objects on stairs.  Place handrails on both sides of stairways and use them. Fix any loose handrails. Make sure handrails on both sides of the stairways are as long as the stairs.  Check carpeting to make sure it is firmly attached along stairs. Make repairs to worn or loose carpet promptly.  Avoid placing throw rugs at the top or bottom of stairways, or properly secure the rug with carpet tape to prevent slippage. Get rid of throw rugs, if possible.  Have an electrician put in a light switch at the top and bottom of the stairs. OTHER FALL PREVENTION TIPS  Wear low-heel or rubber-soled shoes that are supportive and fit  well. Wear closed toe shoes.  When using a stepladder, make sure it is fully opened and both spreaders are firmly locked. Do not climb a closed stepladder.  Add color or contrast paint or tape to grab bars and handrails in your home. Place contrasting color strips on first and last steps.  Learn and use mobility aids as needed. Install an electrical emergency response system.  Turn on lights to avoid dark areas. Replace light bulbs that burn out immediately. Get light switches that glow.  Arrange furniture to create clear pathways. Keep furniture in the same place.  Firmly  attach carpet with non-skid or double-sided tape.  Eliminate uneven floor surfaces.  Select a carpet pattern that does not visually hide the edge of steps.  Be aware of all pets. OTHER HOME SAFETY TIPS  Set the water temperature for 120 F (48.8 C).  Keep emergency numbers on or near the telephone.  Keep smoke detectors on every level of the home and near sleeping areas. Document Released: 01/14/2002 Document Revised: 07/26/2011 Document Reviewed: 04/15/2011 [facility] Patient Information 2014 [place].

## 2013-06-16 NOTE — ED Notes (Signed)
Pt. Ready for discharged , awaiting for PTAR.

## 2013-06-16 NOTE — ED Notes (Signed)
Bed: KD59 Expected date:  Expected time:  Means of arrival:  Comments: EMS 71M fall

## 2013-06-16 NOTE — ED Provider Notes (Signed)
CSN: [id number]     Arrival date & time 06/16/13  0154 History   First MD Initiated Contact with Patient 06/16/13 0158     Chief Complaint  Patient presents with  . Fall     (Consider location/radiation/quality/duration/timing/severity/associated sxs/prior Treatment) HPI Comments: 80 year old male with atrial fibrillation, Parkinson's, dementia presents from the nursing home after unwitnessed fall at 1 AM today. Patient was found sitting on the floor facing the bathroom. Reportedly patient is at baseline. Unknown head injury. Patient had a fall yesterday and has multiple falls. No obvious injuries reported.  Patient is a 80 y.o. male presenting with fall. The history is provided by the nursing home.  Fall    Past Medical History  Diagnosis Date  . Parkinson's disease     Dr Morene Antu  . Hypertension   . Hypothyroidism     Caused by Amiodarone, resolved  . Orthostatic hypotension     Chronic  . Atrial fibrillation   . Chronic anticoagulation   . BPH (benign prostatic hyperplasia)   . Cancer     hx of skin cancer;? basal cell  . Gastritis   . Shortness of breath   . Pneumonia     hx of   . Blood dyscrasia     chronic anticoagulation  . Dementia    Past Surgical History  Procedure Laterality Date  . Hernia repair       X 4 total  . Appendectomy  Age 41    S/P  rupture  . Knee surgery  Age 21  . US echocardiography  July 2012    EF 55-60%  . Cardiovascular stress test  09-04-2003    EF 66%  . Middle ear surgery     Family History  Problem Relation Age of Onset  . Dementia Mother   . Heart disease Father     CHF  . Heart disease Paternal Grandfather     MI  . Cancer Brother     LUNG  . Heart disease Brother     BY-PASS   History  Substance Use Topics  . Smoking status: Never Smoker   . Smokeless tobacco: Never Used  . Alcohol Use: No     Comment: very rarely    Review of Systems  Unable to perform ROS: Dementia      Allergies  Sulfonamide  derivatives  Home Medications   Prior to Admission medications   Medication Sig Start Date End Date Taking? Authorizing Provider  aspirin 81 MG chewable tablet Chew 81 mg by mouth daily.   Yes Historical Provider, MD  carbidopa-levodopa (SINEMET IR) 25-100 MG per tablet Take 1 tablet by mouth 4 (four) times daily.   Yes Historical Provider, MD  citalopram (CELEXA) 20 MG tablet Take 20 mg by mouth every morning.   Yes Historical Provider, MD  digoxin (LANOXIN) 0.125 MG tablet Take 0.125 mg by mouth daily.   Yes Historical Provider, MD  diphenhydramine-acetaminophen (TYLENOL PM) 25-500 MG TABS Take 1 tablet by mouth at bedtime as needed (for insomnia).   Yes Historical Provider, MD  ENSURE (ENSURE) Take 1 Can by mouth 3 (three) times daily - between meals and at bedtime.   Yes Historical Provider, MD  fludrocortisone (FLORINEF) 0.1 MG tablet Take 0.1 mg by mouth daily.   Yes Historical Provider, MD  levothyroxine (SYNTHROID, LEVOTHROID) 50 MCG tablet Take 50 mcg by mouth daily before breakfast.   Yes Historical Provider, MD  loperamide (IMODIUM) 2 MG capsule Take 2 mg  by mouth as needed for diarrhea or loose stools.   Yes Historical Provider, MD  LORazepam (ATIVAN) 0.5 MG tablet Take 0.25 mg by mouth at bedtime.    Yes Historical Provider, MD  QUEtiapine (SEROQUEL) 25 MG tablet Take 25 mg by mouth at bedtime. 05/24/12  Yes Star Age, MD  selenium sulfide (SELSUN) 1 % LOTN Apply 1 application topically every morning. Wash face and scalp everyday   Yes Historical Provider, MD   BP 129/81  Pulse 79  Temp(Src) 97.7 F (36.5 C) (Oral)  Resp 16  SpO2 100% Physical Exam  Nursing note and vitals reviewed. Constitutional: He appears well-developed and well-nourished. No distress.  HENT:  Head: Normocephalic and atraumatic.  Eyes: Conjunctivae are normal. Right eye exhibits no discharge. Left eye exhibits no discharge.  Neck: Normal range of motion. Neck supple. No tracheal deviation present.   Cardiovascular: Normal rate and regular rhythm.   Pulmonary/Chest: Effort normal and breath sounds normal.  Abdominal: Soft. He exhibits no distension. There is no tenderness. There is no guarding.  Musculoskeletal: He exhibits no edema.  No midline vertebral tenderness cervical, thoracic, lumbar.  Neurological: He is alert.  Pleasant dementia, moves all extremities equal bilateral, sensation grossly intact, pupils equal bilateral neck supple, no drift or facial droop  Skin: Skin is warm. No rash noted.    ED Course  Procedures (including critical care time) Labs Review Labs Reviewed - No data to display  Imaging Review Dg Lumbar Spine Complete  06/15/2013   CLINICAL DATA:  Low back pain  EXAM: LUMBAR SPINE - COMPLETE 4+ VIEW  COMPARISON:  None.  FINDINGS: Compression deformity involving L1 age indeterminate of the magnitude of 30 to 35% vertebral body height loss. There is no appreciable cortical irregularity. No further evidence of fracture or dislocation. Degenerative disc disease changes at L3-4 and L5-S1.  IMPRESSION: Compression deformity involving L1 age indeterminate  Degenerative disc disease changes L3-4 L5-S1.   Electronically Signed   By: Margaree Mackintosh M.D.   On: 06/15/2013 19:03   Dg Pelvis 1-2 Views  06/16/2013   CLINICAL DATA:  Unwitnessed fall.  EXAM: PELVIS - 1-2 VIEW  COMPARISON:  Sacral radiograph Jun 15, 2013.  FINDINGS: There is no evidence of pelvic fracture or diastasis. No other pelvic bone lesions are seen. Coil material projecting in the pelvis likely reflects herniorrhaphy. Degenerative change of the included lumbar spine.  IMPRESSION: No acute fracture deformity or dislocation.   Electronically Signed   By: Elon Alas   On: 06/16/2013 03:05   Dg Sacrum/coccyx  06/15/2013   CLINICAL DATA:  FALL  EXAM: SACRUM AND COCCYX - 2+ VIEW  COMPARISON:  None.  FINDINGS: There is no evidence of fracture or other focal bone lesions osteoarthritic changes appreciated within  the hips. Degenerative disc disease changes at L5-S1.  IMPRESSION: No acute osseous abnormalities. Degenerative disc disease L5-S1 and osteoarthritic changes within the right left hips.   Electronically Signed   By: Margaree Mackintosh M.D.   On: 06/15/2013 19:04   Ct Head Wo Contrast  06/16/2013   CLINICAL DATA:  Fall.  EXAM: CT HEAD WITHOUT CONTRAST  CT CERVICAL SPINE WITHOUT CONTRAST  TECHNIQUE: Multidetector CT imaging of the head and cervical spine was performed following the standard protocol without intravenous contrast. Multiplanar CT image reconstructions of the cervical spine were also generated.  COMPARISON:  Head CT from yesterday  FINDINGS: CT HEAD FINDINGS  Skull and Sinuses:No significant abnormality.  Orbits: Bilateral cataract resection.  Brain:  No evidence of acute abnormality, such as acute infarction, hemorrhage, hydrocephalus, or mass lesion/mass effect. Cerebral volume loss which is age appropriate. There is chronic small vessel disease, with white matter low-attenuation around the lateral ventricles. White matter disease is mild, and not unexpected for age.  CT CERVICAL SPINE FINDINGS  No evidence of acute fracture or subluxation. There is unchanged, degenerative C3-4 and C5-6 anterolisthesis. Unchanged reversal cervical lordosis centered at C3-4. No gross cervical canal hematoma or prevertebral edema. There is diffuse degenerative disc and facet disease, with disc changes most advanced at C4-5 and C5-6 where there disc osteophyte complexes narrowing the ventral canal. No high-grade canal stenosis. No prevertebral edema or gross cervical canal hematoma.  IMPRESSION: No acute intracranial or cervical spine injury.   Electronically Signed   By: Jorje Guild M.D.   On: 06/16/2013 03:12   Ct Head Wo Contrast  06/15/2013   CLINICAL DATA:  Fall.  EXAM: CT HEAD WITHOUT CONTRAST  CT CERVICAL SPINE WITHOUT CONTRAST  TECHNIQUE: Multidetector CT imaging of the head and cervical spine was performed  following the standard protocol without intravenous contrast. Multiplanar CT image reconstructions of the cervical spine were also generated.  COMPARISON:  05/21/2013  FINDINGS: CT HEAD FINDINGS  The ventricles, cisterns and other CSF spaces are within normal. There is no mass, mass effect, shift of midline structures or acute hemorrhage. There is no evidence of acute infarction. There is mild chronic ischemic microvascular disease. Remaining bones soft tissues are unremarkable.  CT CERVICAL SPINE FINDINGS  Vertebral body alignment and heights are within normal. There is mild spondylosis throughout the cervical spine. There is minimal disc space narrowing at the C4-5 and C5-6 levels unchanged. Prevertebral soft tissues are within normal. There is moderate uncovertebral joint spurring and facet arthropathy. The atlantoaxial articulation is within normal. There is no acute fracture or subluxation. There is bilateral neural foraminal narrowing at multiple levels due to bony spurring. Remainder the exam is unremarkable  IMPRESSION: No acute intracranial findings.  No acute cervical spine injury.  Mild chronic ischemic microvascular disease.  Spondylosis of the cervical spine with disc disease at the C4-5 and C5-6 levels. Multilevel neural foraminal narrowing.   Electronically Signed   By: Marin Olp M.D.   On: 06/15/2013 19:21   Ct Cervical Spine Wo Contrast  06/16/2013   CLINICAL DATA:  Fall.  EXAM: CT HEAD WITHOUT CONTRAST  CT CERVICAL SPINE WITHOUT CONTRAST  TECHNIQUE: Multidetector CT imaging of the head and cervical spine was performed following the standard protocol without intravenous contrast. Multiplanar CT image reconstructions of the cervical spine were also generated.  COMPARISON:  Head CT from yesterday  FINDINGS: CT HEAD FINDINGS  Skull and Sinuses:No significant abnormality.  Orbits: Bilateral cataract resection.  Brain: No evidence of acute abnormality, such as acute infarction, hemorrhage,  hydrocephalus, or mass lesion/mass effect. Cerebral volume loss which is age appropriate. There is chronic small vessel disease, with white matter low-attenuation around the lateral ventricles. White matter disease is mild, and not unexpected for age.  CT CERVICAL SPINE FINDINGS  No evidence of acute fracture or subluxation. There is unchanged, degenerative C3-4 and C5-6 anterolisthesis. Unchanged reversal cervical lordosis centered at C3-4. No gross cervical canal hematoma or prevertebral edema. There is diffuse degenerative disc and facet disease, with disc changes most advanced at C4-5 and C5-6 where there disc osteophyte complexes narrowing the ventral canal. No high-grade canal stenosis. No prevertebral edema or gross cervical canal hematoma.  IMPRESSION: No acute intracranial or  cervical spine injury.   Electronically Signed   By: Jorje Guild M.D.   On: 06/16/2013 03:12   Ct Cervical Spine Wo Contrast  06/15/2013   CLINICAL DATA:  Fall.  EXAM: CT HEAD WITHOUT CONTRAST  CT CERVICAL SPINE WITHOUT CONTRAST  TECHNIQUE: Multidetector CT imaging of the head and cervical spine was performed following the standard protocol without intravenous contrast. Multiplanar CT image reconstructions of the cervical spine were also generated.  COMPARISON:  05/21/2013  FINDINGS: CT HEAD FINDINGS  The ventricles, cisterns and other CSF spaces are within normal. There is no mass, mass effect, shift of midline structures or acute hemorrhage. There is no evidence of acute infarction. There is mild chronic ischemic microvascular disease. Remaining bones soft tissues are unremarkable.  CT CERVICAL SPINE FINDINGS  Vertebral body alignment and heights are within normal. There is mild spondylosis throughout the cervical spine. There is minimal disc space narrowing at the C4-5 and C5-6 levels unchanged. Prevertebral soft tissues are within normal. There is moderate uncovertebral joint spurring and facet arthropathy. The atlantoaxial  articulation is within normal. There is no acute fracture or subluxation. There is bilateral neural foraminal narrowing at multiple levels due to bony spurring. Remainder the exam is unremarkable  IMPRESSION: No acute intracranial findings.  No acute cervical spine injury.  Mild chronic ischemic microvascular disease.  Spondylosis of the cervical spine with disc disease at the C4-5 and C5-6 levels. Multilevel neural foraminal narrowing.   Electronically Signed   By: Marin Olp M.D.   On: 06/15/2013 19:21     EKG Interpretation None      MDM   Final diagnoses:  Fall  Dementia   Patient had baseline dementia and reportedly at baseline. With unwitnessed fall and dementia with difficulty obtaining history CT head, cervical spine and pelvic x-ray done which revealed no acute findings.  Results reviewed no acute findings. Patient is safe to return to nursing home at this time. No fever and vitals unremarkable in ED.   Mariea Clonts, MD 06/16/13 [phone]

## 2013-06-16 NOTE — ED Notes (Signed)
Per EMS, pt. From [facility] who was reported of fall at 0100 this morning, pt. Was found sitting on the floor  facing the bathroom in  His bedroom. pt. Has dementia, fall un witnessed , no injury reported. Pt. Was seen here yesterday of the same complaint , fell at 2pm yesterday.

## 2013-06-28 ENCOUNTER — Encounter (HOSPITAL_COMMUNITY): Payer: Self-pay | Admitting: Emergency Medicine

## 2013-06-28 ENCOUNTER — Emergency Department (HOSPITAL_COMMUNITY)
Admission: EM | Admit: 2013-06-28 | Discharge: 2013-06-28 | Disposition: A | Discharge status: Home / Self Care | Payer: Medicare HMO | Attending: Emergency Medicine | Admitting: Emergency Medicine

## 2013-06-28 ENCOUNTER — Emergency Department (HOSPITAL_COMMUNITY): Payer: Medicare HMO

## 2013-06-28 DIAGNOSIS — Y9389 Activity, other specified: Secondary | ICD-10-CM | POA: Insufficient documentation

## 2013-06-28 DIAGNOSIS — Y9210 Unspecified residential institution as the place of occurrence of the external cause: Secondary | ICD-10-CM | POA: Insufficient documentation

## 2013-06-28 DIAGNOSIS — Z8719 Personal history of other diseases of the digestive system: Secondary | ICD-10-CM | POA: Insufficient documentation

## 2013-06-28 DIAGNOSIS — Z7982 Long term (current) use of aspirin: Secondary | ICD-10-CM | POA: Insufficient documentation

## 2013-06-28 DIAGNOSIS — Z7901 Long term (current) use of anticoagulants: Secondary | ICD-10-CM | POA: Insufficient documentation

## 2013-06-28 DIAGNOSIS — Z87448 Personal history of other diseases of urinary system: Secondary | ICD-10-CM | POA: Insufficient documentation

## 2013-06-28 DIAGNOSIS — Z79899 Other long term (current) drug therapy: Secondary | ICD-10-CM | POA: Insufficient documentation

## 2013-06-28 DIAGNOSIS — Z8701 Personal history of pneumonia (recurrent): Secondary | ICD-10-CM | POA: Insufficient documentation

## 2013-06-28 DIAGNOSIS — W19XXXA Unspecified fall, initial encounter: Secondary | ICD-10-CM

## 2013-06-28 DIAGNOSIS — G20 Parkinson's disease: Secondary | ICD-10-CM | POA: Insufficient documentation

## 2013-06-28 DIAGNOSIS — Z85828 Personal history of other malignant neoplasm of skin: Secondary | ICD-10-CM | POA: Insufficient documentation

## 2013-06-28 DIAGNOSIS — E039 Hypothyroidism, unspecified: Secondary | ICD-10-CM | POA: Insufficient documentation

## 2013-06-28 DIAGNOSIS — I4891 Unspecified atrial fibrillation: Secondary | ICD-10-CM | POA: Insufficient documentation

## 2013-06-28 DIAGNOSIS — I10 Essential (primary) hypertension: Secondary | ICD-10-CM | POA: Insufficient documentation

## 2013-06-28 DIAGNOSIS — W1809XA Striking against other object with subsequent fall, initial encounter: Secondary | ICD-10-CM | POA: Insufficient documentation

## 2013-06-28 DIAGNOSIS — F0280 Dementia in other diseases classified elsewhere without behavioral disturbance: Secondary | ICD-10-CM | POA: Insufficient documentation

## 2013-06-28 DIAGNOSIS — G20A1 Parkinson's disease without dyskinesia, without mention of fluctuations: Secondary | ICD-10-CM | POA: Insufficient documentation

## 2013-06-28 DIAGNOSIS — IMO0002 Reserved for concepts with insufficient information to code with codable children: Secondary | ICD-10-CM | POA: Insufficient documentation

## 2013-06-28 NOTE — ED Provider Notes (Signed)
Medical screening examination/treatment/procedure(s) were conducted as a shared visit with non-physician practitioner(s) and myself.  I personally evaluated the patient during the encounter.   EKG Interpretation None     Patient here after a witnessed fall the nursing home. Head CT negative. No focal deficits. Stable for discharge  Leota Jacobsen, MD 06/28/13 2019

## 2013-06-28 NOTE — ED Notes (Signed)
Pt presented by EMS from [facility] skilled facility. Report of a witnessed fall during dinner, head impact, no LOC, denies neck pain or back pain, c/o of gluteal pain. No obvious hematoma(s) on assessment. Pt has hx of dementia, per report no change in baseline mental status. Facility sent pt to ED for check up per their protocol.

## 2013-06-28 NOTE — Discharge Instructions (Signed)
Fall Prevention in Hospitals  As a hospital patient, your condition and the treatments you receive can increase your risk for falls. Some additional risk factors for falls in a hospital include:   Being in an unfamiliar environment.   Being on bed rest.   Your surgery.   Taking certain medicines.   Your tubing requirements, such as intravenous (IV) therapy or catheters.  It is important that you learn how to decrease fall risks while at the hospital. Below are important tips that can help prevent falls.  SAFETY TIPS FOR PREVENTING FALLS  Talk about your risk of falling.   Ask your caregiver why you are at risk for falling. Is it your medicine, illness, tubing placement, or something else?   Make a plan with your caregiver to keep you safe from falls.   Ask your caregiver or pharmacist about side effect of your medicines. Some medicines can make you dizzy or affect your coordination.  Ask for help.   Ask for help before getting out of bed. You may need to press your call button.   Ask for assistance in getting you safely to the toilet.   Ask for a walker or cane to be put at your bedside. Ask that most of the side rails on your bed be placed up before your caregiver leaves the room.   Ask family or friends to sit with you.   Ask for things that are out of your reach, such as your glasses, hearing aids, telephone, bedside table, or call button.  Follow these tips to avoid falling:   Stay lying or seated, rather than standing, while waiting for help.   Wear rubber-soled slippers or shoes whenever you walk in the hospital.   Avoid quick, sudden movements.   Change positions slowly.   Sit on the side of your bed before standing.   Stand up slowly and wait before you start to walk.   Let your caregiver know if there is a spill on the floor.   Pay careful attention to the medical equipment, electrical cords, and tubes around you.   When you need help, use your call button by your bed or in the  bathroom. Wait for one of your caregivers to help you.   If you feel dizzy or unsure of your footing, return to bed and wait for assistance.   Avoid being distracted by the TV, telephone, or another person in your room.   Do not lean or support yourself on rolling objects, such as IV poles or bedside tables.  Document Released: 01/22/2000 Document Revised: 01/11/2012 Document Reviewed: 10/02/2011  [employer] Patient Information 2014 [employer]

## 2013-06-28 NOTE — ED Notes (Signed)
[facility] called for pt transfer 

## 2013-06-28 NOTE — ED Provider Notes (Signed)
CSN: [id number]     Arrival date & time 06/28/13  1906 History   First MD Initiated Contact with Patient 06/28/13 1907     Chief Complaint  Patient presents with  . Fall    Head impact     (Consider location/radiation/quality/duration/timing/severity/associated sxs/prior Treatment) HPI Comments: Patient sent per protocol from SNF. There is a level five caveat due to dementia. According to EMS. Patient had a witnessed fall during dinner. As he was sitting, he missed his chair sliding down onto his bottom. He then rolled back onto the floor hitting his head. No LOC. He did complain of some tailbone pain. No complaints here in the ED. No change in baseline mental status per facility.  Patient is a 80 y.o. male presenting with fall.  Fall This is a recurrent problem. The current episode started today. The problem occurs intermittently. The problem has been unchanged. Associated symptoms comments: Unable to review due to dementia.    Past Medical History  Diagnosis Date  . Parkinson's disease     Dr Morene Antu  . Hypertension   . Hypothyroidism     Caused by Amiodarone, resolved  . Orthostatic hypotension     Chronic  . Atrial fibrillation   . Chronic anticoagulation   . BPH (benign prostatic hyperplasia)   . Cancer     hx of skin cancer;? basal cell  . Gastritis   . Shortness of breath   . Pneumonia     hx of   . Blood dyscrasia     chronic anticoagulation  . Dementia    Past Surgical History  Procedure Laterality Date  . Hernia repair       X 4 total  . Appendectomy  Age 37    S/P  rupture  . Knee surgery  Age 63  . US echocardiography  July 2012    EF 55-60%  . Cardiovascular stress test  09-04-2003    EF 66%  . Middle ear surgery     Family History  Problem Relation Age of Onset  . Dementia Mother   . Heart disease Father     CHF  . Heart disease Paternal Grandfather     MI  . Cancer Brother     LUNG  . Heart disease Brother     BY-PASS   History   Substance Use Topics  . Smoking status: Never Smoker   . Smokeless tobacco: Never Used  . Alcohol Use: No     Comment: very rarely    Review of Systems  Unable to perform ROS: Dementia      Allergies  Sulfonamide derivatives  Home Medications   Prior to Admission medications   Medication Sig Start Date End Date Taking? Authorizing Provider  aspirin 81 MG chewable tablet Chew 81 mg by mouth daily.    Historical Provider, MD  carbidopa-levodopa (SINEMET IR) 25-100 MG per tablet Take 1 tablet by mouth 4 (four) times daily.    Historical Provider, MD  citalopram (CELEXA) 20 MG tablet Take 20 mg by mouth every morning.    Historical Provider, MD  digoxin (LANOXIN) 0.125 MG tablet Take 0.125 mg by mouth daily.    Historical Provider, MD  diphenhydramine-acetaminophen (TYLENOL PM) 25-500 MG TABS Take 1 tablet by mouth at bedtime as needed (for insomnia).    Historical Provider, MD  ENSURE (ENSURE) Take 1 Can by mouth 3 (three) times daily - between meals and at bedtime.    Historical Provider, MD  fludrocortisone (  FLORINEF) 0.1 MG tablet Take 0.1 mg by mouth daily.    Historical Provider, MD  levothyroxine (SYNTHROID, LEVOTHROID) 50 MCG tablet Take 50 mcg by mouth daily before breakfast.    Historical Provider, MD  loperamide (IMODIUM) 2 MG capsule Take 2 mg by mouth as needed for diarrhea or loose stools.    Historical Provider, MD  LORazepam (ATIVAN) 0.5 MG tablet Take 0.25 mg by mouth at bedtime.     Historical Provider, MD  QUEtiapine (SEROQUEL) 25 MG tablet Take 25 mg by mouth at bedtime. 05/24/12   Star Age, MD  selenium sulfide (SELSUN) 1 % LOTN Apply 1 application topically every morning. Wash face and scalp everyday    Historical Provider, MD   BP 115/71  Pulse 84  Temp(Src) 98.1 F (36.7 C) (Oral)  Resp 20  SpO2 98% Physical Exam  Nursing note and vitals reviewed. Constitutional: He appears well-developed and well-nourished. No distress.  HENT:  Head: Normocephalic  and atraumatic.  Eyes: Conjunctivae are normal. Pupils are equal, round, and reactive to light.  Neck: Normal range of motion. Neck supple.  No midline tenderness. Full ROM  Cardiovascular: Normal rate, regular rhythm and normal heart sounds.   Pulmonary/Chest: Effort normal and breath sounds normal. He exhibits no tenderness.  Abdominal: Soft. Bowel sounds are normal. There is no tenderness.  Musculoskeletal: Normal range of motion.  No bony tenederness midline. No sacral or coccyx tenderness. No step off or deformity. No bruising. No hip tenderness. Baseline ROM without pain.  Neurological: He is alert. He has normal reflexes.    ED Course  Procedures (including critical care time) Labs Review Labs Reviewed - No data to display  Imaging Review Ct Head Wo Contrast  06/28/2013   CLINICAL DATA:  Post fall  EXAM: CT HEAD WITHOUT CONTRAST  TECHNIQUE: Contiguous axial images were obtained from the base of the skull through the vertex without intravenous contrast.  COMPARISON:  06/16/2013  FINDINGS: No skull fracture is noted. Paranasal sinuses and mastoid air cells are unremarkable. No intracranial hemorrhage, mass effect or midline shift. Stable cerebral atrophy. Stable mild periventricular chronic white matter disease. Atherosclerotic calcifications of carotid siphon. No acute infarction. No mass lesion is noted on this unenhanced scan.  IMPRESSION: No acute intracranial abnormality.  Stable cerebral atrophy.   Electronically Signed   By: Lahoma Crocker M.D.   On: 06/28/2013 20:02     EKG Interpretation None      MDM   Final diagnoses:  Fall    Elderly patient with dementia. Fall at nursing home. At baseline mental status. No signs of injury on PE. No blood thinners. Negative labs and imaging. Appears safe to return to SNF     Margarita Mail, PA-C 06/30/13 1110

## 2013-06-28 NOTE — ED Notes (Signed)
Bed: WA20 Expected date:  Expected time:  Means of arrival:  Comments: EMS- fall, butt pain

## 2013-06-28 NOTE — ED Notes (Signed)
Patient transported to CT 

## 2013-06-28 NOTE — ED Notes (Signed)
PA-C at bedside 

## 2013-07-02 NOTE — ED Provider Notes (Signed)
Medical screening examination/treatment/procedure(s) were performed by non-physician practitioner and as supervising physician I was immediately available for consultation/collaboration.   Leota Jacobsen, MD 07/02/13 2010

## 2013-07-15 ENCOUNTER — Ambulatory Visit: Payer: Medicare Other | Admitting: Neurology

## 2013-07-21 ENCOUNTER — Encounter (HOSPITAL_COMMUNITY): Payer: Self-pay | Admitting: Emergency Medicine

## 2013-07-21 ENCOUNTER — Emergency Department (HOSPITAL_COMMUNITY)
Admission: EM | Admit: 2013-07-21 | Discharge: 2013-07-21 | Disposition: A | Discharge status: Home / Self Care | Payer: Medicare HMO | Attending: Emergency Medicine | Admitting: Emergency Medicine

## 2013-07-21 ENCOUNTER — Emergency Department (HOSPITAL_COMMUNITY): Payer: Medicare HMO

## 2013-07-21 DIAGNOSIS — Z8701 Personal history of pneumonia (recurrent): Secondary | ICD-10-CM | POA: Insufficient documentation

## 2013-07-21 DIAGNOSIS — W19XXXA Unspecified fall, initial encounter: Secondary | ICD-10-CM

## 2013-07-21 DIAGNOSIS — Z87448 Personal history of other diseases of urinary system: Secondary | ICD-10-CM | POA: Insufficient documentation

## 2013-07-21 DIAGNOSIS — Z7982 Long term (current) use of aspirin: Secondary | ICD-10-CM | POA: Insufficient documentation

## 2013-07-21 DIAGNOSIS — Z7901 Long term (current) use of anticoagulants: Secondary | ICD-10-CM | POA: Insufficient documentation

## 2013-07-21 DIAGNOSIS — Z85828 Personal history of other malignant neoplasm of skin: Secondary | ICD-10-CM | POA: Insufficient documentation

## 2013-07-21 DIAGNOSIS — R296 Repeated falls: Secondary | ICD-10-CM | POA: Insufficient documentation

## 2013-07-21 DIAGNOSIS — Y9210 Unspecified residential institution as the place of occurrence of the external cause: Secondary | ICD-10-CM | POA: Insufficient documentation

## 2013-07-21 DIAGNOSIS — IMO0002 Reserved for concepts with insufficient information to code with codable children: Secondary | ICD-10-CM | POA: Insufficient documentation

## 2013-07-21 DIAGNOSIS — F0280 Dementia in other diseases classified elsewhere without behavioral disturbance: Secondary | ICD-10-CM | POA: Insufficient documentation

## 2013-07-21 DIAGNOSIS — Z8719 Personal history of other diseases of the digestive system: Secondary | ICD-10-CM | POA: Insufficient documentation

## 2013-07-21 DIAGNOSIS — Z79899 Other long term (current) drug therapy: Secondary | ICD-10-CM | POA: Insufficient documentation

## 2013-07-21 DIAGNOSIS — E039 Hypothyroidism, unspecified: Secondary | ICD-10-CM | POA: Insufficient documentation

## 2013-07-21 DIAGNOSIS — I10 Essential (primary) hypertension: Secondary | ICD-10-CM | POA: Insufficient documentation

## 2013-07-21 DIAGNOSIS — G20 Parkinson's disease: Secondary | ICD-10-CM | POA: Insufficient documentation

## 2013-07-21 DIAGNOSIS — S59909A Unspecified injury of unspecified elbow, initial encounter: Secondary | ICD-10-CM | POA: Insufficient documentation

## 2013-07-21 DIAGNOSIS — F29 Unspecified psychosis not due to a substance or known physiological condition: Secondary | ICD-10-CM | POA: Insufficient documentation

## 2013-07-21 DIAGNOSIS — G20A1 Parkinson's disease without dyskinesia, without mention of fluctuations: Secondary | ICD-10-CM | POA: Insufficient documentation

## 2013-07-21 DIAGNOSIS — Y9389 Activity, other specified: Secondary | ICD-10-CM | POA: Insufficient documentation

## 2013-07-21 DIAGNOSIS — S59919A Unspecified injury of unspecified forearm, initial encounter: Principal | ICD-10-CM

## 2013-07-21 DIAGNOSIS — I4891 Unspecified atrial fibrillation: Secondary | ICD-10-CM | POA: Insufficient documentation

## 2013-07-21 DIAGNOSIS — S6990XA Unspecified injury of unspecified wrist, hand and finger(s), initial encounter: Principal | ICD-10-CM

## 2013-07-21 LAB — CBC WITH DIFFERENTIAL/PLATELET
Basophils Absolute: 0 10*3/uL (ref 0.0–0.1)
Basophils Relative: 0 % (ref 0–1)
EOS ABS: 0 10*3/uL (ref 0.0–0.7)
EOS PCT: 0 % (ref 0–5)
HCT: 35.7 % — ABNORMAL LOW (ref 39.0–52.0)
HEMOGLOBIN: 11.7 g/dL — AB (ref 13.0–17.0)
LYMPHS ABS: 0.8 10*3/uL (ref 0.7–4.0)
LYMPHS PCT: 12 % (ref 12–46)
MCH: 30.5 pg (ref 26.0–34.0)
MCHC: 32.8 g/dL (ref 30.0–36.0)
MCV: 93.2 fL (ref 78.0–100.0)
MONOS PCT: 11 % (ref 3–12)
Monocytes Absolute: 0.8 10*3/uL (ref 0.1–1.0)
Neutro Abs: 5.5 10*3/uL (ref 1.7–7.7)
Neutrophils Relative %: 77 % (ref 43–77)
Platelets: 195 10*3/uL (ref 150–400)
RBC: 3.83 MIL/uL — AB (ref 4.22–5.81)
RDW: 13.2 % (ref 11.5–15.5)
WBC: 7.1 10*3/uL (ref 4.0–10.5)

## 2013-07-21 LAB — TROPONIN I

## 2013-07-21 LAB — URINE MICROSCOPIC-ADD ON

## 2013-07-21 LAB — URINALYSIS, ROUTINE W REFLEX MICROSCOPIC
BILIRUBIN URINE: NEGATIVE
GLUCOSE, UA: NEGATIVE mg/dL
HGB URINE DIPSTICK: NEGATIVE
Ketones, ur: NEGATIVE mg/dL
Nitrite: NEGATIVE
Protein, ur: 30 mg/dL — AB
Specific Gravity, Urine: 1.025 (ref 1.005–1.030)
UROBILINOGEN UA: 2 mg/dL — AB (ref 0.0–1.0)
pH: 6 (ref 5.0–8.0)

## 2013-07-21 LAB — BASIC METABOLIC PANEL
BUN: 19 mg/dL (ref 6–23)
CO2: 28 meq/L (ref 19–32)
Calcium: 8.4 mg/dL (ref 8.4–10.5)
Chloride: 103 mEq/L (ref 96–112)
Creatinine, Ser: 0.93 mg/dL (ref 0.50–1.35)
GFR calc Af Amer: 89 mL/min — ABNORMAL LOW (ref >90)
GFR, EST NON AFRICAN AMERICAN: 77 mL/min — AB (ref >90)
GLUCOSE: 96 mg/dL (ref 70–99)
Potassium: 3.9 mEq/L (ref 3.7–5.3)
SODIUM: 142 meq/L (ref 137–147)

## 2013-07-21 LAB — DIGOXIN LEVEL: DIGOXIN LVL: 0.4 ng/mL — AB (ref 0.8–2.0)

## 2013-07-21 MED ORDER — SODIUM CHLORIDE 0.9 % IV SOLN
INTRAVENOUS | Status: DC
  Administered 2013-07-21: 10 mL/h via INTRAVENOUS

## 2013-07-21 NOTE — Discharge Instructions (Signed)

## 2013-07-21 NOTE — ED Notes (Signed)
The patient lives in [place] and personnel said they heard him yell. Personnel went in to his room and found him laying on the floor.  The patient had an umwitnessed fall.  Personnel called EMS, patient does have a deformity to his neck.  He is complaining of neck pain.  Patient does have dementia and his GCS is 14 which is his baseline.  According to EMS, he thought they were golfers.  EMS placed a 20 gauge IV in his left forearm and transported him here to the ED to be evaluated.

## 2013-07-21 NOTE — ED Notes (Signed)
Patient is resting comfortably. 

## 2013-07-21 NOTE — ED Notes (Signed)
The patient is confused and pulled his IV out.

## 2013-07-21 NOTE — ED Notes (Signed)
Patient is dressed is ready to go back to [facility], waiting on [facility].

## 2013-07-21 NOTE — ED Provider Notes (Signed)
CSN: [id number]     Arrival date & time 07/21/13  0102 History   First MD Initiated Contact with Patient 07/21/13 [phone]     Chief Complaint  Patient presents with  . Fall    The patient lives in [place] and personnel said they heard him yell. Personnel went in to his room and found him laying on the floor.  The patient had an umwitnessed fall.     (Consider location/radiation/quality/duration/timing/severity/associated sxs/prior Treatment) Patient is a 80 y.o. male presenting with fall. The history is provided by the EMS personnel. The history is limited by the condition of the patient.  Fall   patient here after being found on the ground by nursing home staff. He is in a memory care unit. He was alert per his normal. Questionable deformity of the neck. This is according to EMS. He does note that he has some neck pain. His confusion is at his baseline. Did have an abrasion to his left elbow. Patient place him backboard in C-spine precautions and transported here. No further history obtainable due to his baseline dementia.  Past Medical History  Diagnosis Date  . Parkinson's disease     Dr Morene Antu  . Hypertension   . Hypothyroidism     Caused by Amiodarone, resolved  . Orthostatic hypotension     Chronic  . Atrial fibrillation   . Chronic anticoagulation   . BPH (benign prostatic hyperplasia)   . Cancer     hx of skin cancer;? basal cell  . Gastritis   . Shortness of breath   . Pneumonia     hx of   . Blood dyscrasia     chronic anticoagulation  . Dementia    Past Surgical History  Procedure Laterality Date  . Hernia repair       X 4 total  . Appendectomy  Age 64    S/P  rupture  . Knee surgery  Age 37  . US echocardiography  July 2012    EF 55-60%  . Cardiovascular stress test  09-04-2003    EF 66%  . Middle ear surgery     Family History  Problem Relation Age of Onset  . Dementia Mother   . Heart disease Father     CHF  . Heart disease Paternal  Grandfather     MI  . Cancer Brother     LUNG  . Heart disease Brother     BY-PASS   History  Substance Use Topics  . Smoking status: Never Smoker   . Smokeless tobacco: Never Used  . Alcohol Use: No     Comment: very rarely    Review of Systems  Unable to perform ROS     Allergies  Sulfonamide derivatives  Home Medications   Prior to Admission medications   Medication Sig Start Date End Date Taking? Authorizing Provider  aspirin 81 MG chewable tablet Chew 81 mg by mouth daily.    Historical Provider, MD  carbidopa-levodopa (SINEMET IR) 25-100 MG per tablet Take 1 tablet by mouth 4 (four) times daily.    Historical Provider, MD  citalopram (CELEXA) 20 MG tablet Take 20 mg by mouth every morning.    Historical Provider, MD  digoxin (LANOXIN) 0.125 MG tablet Take 0.125 mg by mouth daily.    Historical Provider, MD  diphenhydramine-acetaminophen (TYLENOL PM) 25-500 MG TABS Take 1 tablet by mouth at bedtime as needed (for insomnia).    Historical Provider, MD  ENSURE (ENSURE) Take  1 Can by mouth 3 (three) times daily - between meals and at bedtime.    Historical Provider, MD  fludrocortisone (FLORINEF) 0.1 MG tablet Take 0.1 mg by mouth daily.    Historical Provider, MD  levothyroxine (SYNTHROID, LEVOTHROID) 50 MCG tablet Take 50 mcg by mouth daily before breakfast.    Historical Provider, MD  loperamide (IMODIUM) 2 MG capsule Take 2 mg by mouth as needed for diarrhea or loose stools.    Historical Provider, MD  LORazepam (ATIVAN) 0.5 MG tablet Take 0.25 mg by mouth at bedtime.     Historical Provider, MD  QUEtiapine (SEROQUEL) 25 MG tablet Take 25 mg by mouth at bedtime. 05/24/12   Star Age, MD  selenium sulfide (SELSUN) 1 % LOTN Apply 1 application topically every morning. Wash face and scalp everyday    Historical Provider, MD   BP 140/81  Pulse 90  Temp(Src) 98.1 F (36.7 C) (Oral)  SpO2 97% Physical Exam  Nursing note and vitals reviewed. Constitutional: Vital signs  are normal.  Non-toxic appearance. No distress. Cervical collar and backboard in place.  HENT:  Head: Normocephalic and atraumatic.  Eyes: Conjunctivae, EOM and lids are normal. Pupils are equal, round, and reactive to light.  Neck: No spinous process tenderness and no muscular tenderness present. No tracheal deviation present. No mass present.  No deformities noted  Cardiovascular: Normal rate, regular rhythm and normal heart sounds.  Exam reveals no gallop.   No murmur heard. Pulmonary/Chest: Effort normal and breath sounds normal. No stridor. No respiratory distress. He has no decreased breath sounds. He has no wheezes. He has no rhonchi. He has no rales.  Abdominal: Soft. Normal appearance and bowel sounds are normal. He exhibits no distension. There is no tenderness. There is no rebound and no CVA tenderness.  Musculoskeletal: Normal range of motion. He exhibits no edema and no tenderness.       Arms: Neurological: He is alert. He has normal strength. No cranial nerve deficit or sensory deficit. GCS eye subscore is 4. GCS verbal subscore is 4. GCS motor subscore is 6.  Skin: Skin is warm and dry. No abrasion and no rash noted.  Psychiatric: He has a normal mood and affect. His speech is normal and behavior is normal.    ED Course  Procedures (including critical care time) Labs Review Labs Reviewed  CBC WITH DIFFERENTIAL  BASIC METABOLIC PANEL  URINALYSIS, ROUTINE W REFLEX MICROSCOPIC    Imaging Review No results found.   EKG Interpretation   Date/Time:  Sunday July 21 2013 07:44:29 EDT Ventricular Rate:  83 PR Interval:    QRS Duration: 85 QT Interval:  376 QTC Calculation: 442 R Axis:   51 Text Interpretation:  Atrial fibrillation Minimal ST depression, inferior  leads Confirmed by Nicklos Gaxiola  MD, Kayren Holck ([id number]) on 07/21/2013 10:17:23 AM      MDM   Final diagnoses:  None    Workup here has been negative. No evidence of acute injury noted. He is stable for  discharge    Leota Jacobsen, MD 07/21/13 1120

## 2013-07-21 NOTE — ED Notes (Signed)
Patient transported to CT 

## 2013-07-28 ENCOUNTER — Encounter (HOSPITAL_COMMUNITY): Payer: Self-pay | Admitting: Emergency Medicine

## 2013-07-28 ENCOUNTER — Emergency Department (HOSPITAL_COMMUNITY): Payer: Medicare HMO

## 2013-07-28 ENCOUNTER — Inpatient Hospital Stay (HOSPITAL_COMMUNITY)
Admission: EM | Admit: 2013-07-28 | Discharge: 2013-07-30 | DRG: 177 | Disposition: A | Discharge status: Hospice | Payer: Medicare HMO | Attending: Internal Medicine | Admitting: Internal Medicine

## 2013-07-28 DIAGNOSIS — J159 Unspecified bacterial pneumonia: Secondary | ICD-10-CM | POA: Diagnosis present

## 2013-07-28 DIAGNOSIS — Z79899 Other long term (current) drug therapy: Secondary | ICD-10-CM

## 2013-07-28 DIAGNOSIS — R1310 Dysphagia, unspecified: Secondary | ICD-10-CM | POA: Diagnosis present

## 2013-07-28 DIAGNOSIS — Z8249 Family history of ischemic heart disease and other diseases of the circulatory system: Secondary | ICD-10-CM

## 2013-07-28 DIAGNOSIS — G3183 Dementia with Lewy bodies: Secondary | ICD-10-CM

## 2013-07-28 DIAGNOSIS — F0280 Dementia in other diseases classified elsewhere without behavioral disturbance: Secondary | ICD-10-CM | POA: Diagnosis present

## 2013-07-28 DIAGNOSIS — N400 Enlarged prostate without lower urinary tract symptoms: Secondary | ICD-10-CM | POA: Diagnosis present

## 2013-07-28 DIAGNOSIS — Z87898 Personal history of other specified conditions: Secondary | ICD-10-CM | POA: Diagnosis present

## 2013-07-28 DIAGNOSIS — G9349 Other encephalopathy: Secondary | ICD-10-CM | POA: Diagnosis present

## 2013-07-28 DIAGNOSIS — Z882 Allergy status to sulfonamides status: Secondary | ICD-10-CM

## 2013-07-28 DIAGNOSIS — N390 Urinary tract infection, site not specified: Secondary | ICD-10-CM | POA: Diagnosis present

## 2013-07-28 DIAGNOSIS — G9340 Encephalopathy, unspecified: Secondary | ICD-10-CM | POA: Diagnosis present

## 2013-07-28 DIAGNOSIS — I10 Essential (primary) hypertension: Secondary | ICD-10-CM | POA: Diagnosis present

## 2013-07-28 DIAGNOSIS — J189 Pneumonia, unspecified organism: Secondary | ICD-10-CM | POA: Diagnosis present

## 2013-07-28 DIAGNOSIS — IMO0002 Reserved for concepts with insufficient information to code with codable children: Secondary | ICD-10-CM | POA: Diagnosis not present

## 2013-07-28 DIAGNOSIS — F0390 Unspecified dementia without behavioral disturbance: Secondary | ICD-10-CM | POA: Diagnosis present

## 2013-07-28 DIAGNOSIS — J690 Pneumonitis due to inhalation of food and vomit: Principal | ICD-10-CM

## 2013-07-28 DIAGNOSIS — Z66 Do not resuscitate: Secondary | ICD-10-CM | POA: Diagnosis present

## 2013-07-28 DIAGNOSIS — I4891 Unspecified atrial fibrillation: Secondary | ICD-10-CM | POA: Diagnosis present

## 2013-07-28 DIAGNOSIS — I951 Orthostatic hypotension: Secondary | ICD-10-CM | POA: Diagnosis present

## 2013-07-28 DIAGNOSIS — E46 Unspecified protein-calorie malnutrition: Secondary | ICD-10-CM | POA: Diagnosis present

## 2013-07-28 DIAGNOSIS — G20A1 Parkinson's disease without dyskinesia, without mention of fluctuations: Secondary | ICD-10-CM | POA: Diagnosis present

## 2013-07-28 DIAGNOSIS — Z85828 Personal history of other malignant neoplasm of skin: Secondary | ICD-10-CM

## 2013-07-28 DIAGNOSIS — Z9181 History of falling: Secondary | ICD-10-CM

## 2013-07-28 DIAGNOSIS — Z9089 Acquired absence of other organs: Secondary | ICD-10-CM

## 2013-07-28 DIAGNOSIS — G20 Parkinson's disease: Secondary | ICD-10-CM | POA: Diagnosis present

## 2013-07-28 DIAGNOSIS — E039 Hypothyroidism, unspecified: Secondary | ICD-10-CM | POA: Diagnosis present

## 2013-07-28 DIAGNOSIS — Z515 Encounter for palliative care: Secondary | ICD-10-CM

## 2013-07-28 LAB — URINALYSIS, ROUTINE W REFLEX MICROSCOPIC
GLUCOSE, UA: NEGATIVE mg/dL
HGB URINE DIPSTICK: NEGATIVE
Ketones, ur: NEGATIVE mg/dL
Nitrite: NEGATIVE
Protein, ur: NEGATIVE mg/dL
Specific Gravity, Urine: 1.028 (ref 1.005–1.030)
Urobilinogen, UA: 2 mg/dL — ABNORMAL HIGH (ref 0.0–1.0)
pH: 5.5 (ref 5.0–8.0)

## 2013-07-28 LAB — COMPREHENSIVE METABOLIC PANEL
ALT: 7 U/L (ref 0–53)
AST: 31 U/L (ref 0–37)
Albumin: 2.3 g/dL — ABNORMAL LOW (ref 3.5–5.2)
Alkaline Phosphatase: 116 U/L (ref 39–117)
BILIRUBIN TOTAL: 1.5 mg/dL — AB (ref 0.3–1.2)
BUN: 22 mg/dL (ref 6–23)
CHLORIDE: 103 meq/L (ref 96–112)
CO2: 24 meq/L (ref 19–32)
CREATININE: 0.84 mg/dL (ref 0.50–1.35)
Calcium: 8.3 mg/dL — ABNORMAL LOW (ref 8.4–10.5)
GFR calc Af Amer: >90 mL/min (ref >90)
GFR, EST NON AFRICAN AMERICAN: 80 mL/min — AB (ref >90)
GLUCOSE: 103 mg/dL — AB (ref 70–99)
Potassium: 4.2 mEq/L (ref 3.7–5.3)
Sodium: 141 mEq/L (ref 137–147)
Total Protein: 6.2 g/dL (ref 6.0–8.3)

## 2013-07-28 LAB — DIGOXIN LEVEL: Digoxin Level: 0.6 ng/mL — ABNORMAL LOW (ref 0.8–2.0)

## 2013-07-28 LAB — CBC WITH DIFFERENTIAL/PLATELET
BASOS ABS: 0 10*3/uL (ref 0.0–0.1)
Basophils Relative: 0 % (ref 0–1)
Eosinophils Absolute: 0 10*3/uL (ref 0.0–0.7)
Eosinophils Relative: 0 % (ref 0–5)
HEMATOCRIT: 37.6 % — AB (ref 39.0–52.0)
HEMOGLOBIN: 12.6 g/dL — AB (ref 13.0–17.0)
LYMPHS PCT: 6 % — AB (ref 12–46)
Lymphs Abs: 0.8 10*3/uL (ref 0.7–4.0)
MCH: 30.4 pg (ref 26.0–34.0)
MCHC: 33.5 g/dL (ref 30.0–36.0)
MCV: 90.8 fL (ref 78.0–100.0)
MONO ABS: 1 10*3/uL (ref 0.1–1.0)
MONOS PCT: 8 % (ref 3–12)
Neutro Abs: 11 10*3/uL — ABNORMAL HIGH (ref 1.7–7.7)
Neutrophils Relative %: 86 % — ABNORMAL HIGH (ref 43–77)
Platelets: 316 10*3/uL (ref 150–400)
RBC: 4.14 MIL/uL — ABNORMAL LOW (ref 4.22–5.81)
RDW: 13 % (ref 11.5–15.5)
WBC: 12.8 10*3/uL — AB (ref 4.0–10.5)

## 2013-07-28 LAB — PROTIME-INR
INR: 1.39 (ref 0.00–1.49)
Prothrombin Time: 16.7 seconds — ABNORMAL HIGH (ref 11.6–15.2)

## 2013-07-28 LAB — APTT: aPTT: 32 seconds (ref 24–37)

## 2013-07-28 LAB — URINE MICROSCOPIC-ADD ON

## 2013-07-28 LAB — PRO B NATRIURETIC PEPTIDE: Pro B Natriuretic peptide (BNP): 4617 pg/mL — ABNORMAL HIGH (ref 0–450)

## 2013-07-28 LAB — TROPONIN I: Troponin I: <0.3 ng/mL (ref <0.30)

## 2013-07-28 MED ORDER — VANCOMYCIN HCL 10 G IV SOLR
1500.0000 mg | Freq: Once | INTRAVENOUS | Status: AC
  Administered 2013-07-28: 1500 mg via INTRAVENOUS
  Filled 2013-07-28: qty 1500

## 2013-07-28 MED ORDER — ACETAMINOPHEN 650 MG RE SUPP
650.0000 mg | Freq: Once | RECTAL | Status: AC
  Administered 2013-07-29: 650 mg via RECTAL
  Filled 2013-07-28: qty 1

## 2013-07-28 MED ORDER — DEXTROSE 5 % IV SOLN: 2.0000 g | INTRAVENOUS | Status: DC

## 2013-07-28 MED ORDER — DEXTROSE-NACL 5-0.9 % IV SOLN
INTRAVENOUS | Status: DC
  Administered 2013-07-28 – 2013-07-29 (×2): via INTRAVENOUS

## 2013-07-28 MED ORDER — DEXTROSE 5 % IV SOLN: 1.0000 g | Freq: Three times a day (TID) | INTRAVENOUS | Status: DC

## 2013-07-28 MED ORDER — VANCOMYCIN HCL IN DEXTROSE 1-5 GM/200ML-% IV SOLN
1000.0000 mg | Freq: Two times a day (BID) | INTRAVENOUS | Status: DC
  Administered 2013-07-29: 1000 mg via INTRAVENOUS
  Filled 2013-07-28: qty 200

## 2013-07-28 MED ORDER — ENOXAPARIN SODIUM 40 MG/0.4ML ~~LOC~~ SOLN
40.0000 mg | SUBCUTANEOUS | Status: DC
  Administered 2013-07-28: 40 mg via SUBCUTANEOUS
  Filled 2013-07-28 (×2): qty 0.4

## 2013-07-28 MED ORDER — HALOPERIDOL LACTATE 5 MG/ML IJ SOLN: 0.5000 mg | Freq: Four times a day (QID) | INTRAMUSCULAR | Status: DC | PRN

## 2013-07-28 MED ORDER — PIPERACILLIN-TAZOBACTAM 3.375 G IVPB
3.3750 g | Freq: Three times a day (TID) | INTRAVENOUS | Status: DC
  Administered 2013-07-28 – 2013-07-29 (×3): 3.375 g via INTRAVENOUS
  Filled 2013-07-28 (×3): qty 50

## 2013-07-28 MED ORDER — SELENIUM SULFIDE 2.5 % EX LOTN
1.0000 "application " | TOPICAL_LOTION | Freq: Every day | CUTANEOUS | Status: DC
  Administered 2013-07-29: 1 via TOPICAL
  Filled 2013-07-28: qty 120

## 2013-07-28 NOTE — ED Notes (Signed)
Bed: WA08 Expected date:  Expected time:  Means of arrival:  Comments: EMS/cough/hypotension

## 2013-07-28 NOTE — H&P (Addendum)
[facility] History and Physical  Carlos Gonzalez WGN:[id number] DOB: 1930-07-14 DOA: 07/28/2013  Referring physician: Dr Mingo Amber PCP: MAZZOCCHI, Reggy Eye, MD   Chief Complaint:   History provided by son and daughter at bedside  HPI:  80 year old male resident of [facility], with a history of A. fib not on anticoagulation anymore, parkinson's disease with progressive dementia, recurrent falls with orthostatic hypotension, dysphagia, urinary incontinence who was brought to the ED for cough and altered mental status since today. At baseline he is very demented and ambulates with help of a walker. Patient needs full assistance with all his ADLs. He has progressive dementia and has difficulty identifying his children as well. He has had multiple ED visits for fall. As per daughter and she went to see the patient today he was having cough with phlegm and was not willing to walk around as he normally does. The nursing staff thought that he was more confused and very less alert than he normally is. There is no history of fever, chills, nausea, vomiting, diaphoresis, abdominal pain or distention, dyspnea or diarrhea.  Course in the ED Patient had a temperature 100.47F, normal blood pressure and O2 sat in low 90s. Blood work done showed WBC of 12.8 a, hemoglobin 4.6 and platelets of 316. Chemistry was unremarkable. Initial troponin was negative. INR of 1.39. Digoxin level was up to date, proBNP was elevated to 4600. UA was suggestive of UTI. Chest x-ray done showed right lung infiltrate with possible pleural effusion. Blood cultures sent from the ED and patient given an empiric dose of IV vancomycin and cefepime and hospitalist consulted for admission to medical floor.  Review of Systems:  As outlined in history of present illness. Unable to get complete review of systems due to patient's advanced dementia.   Past Medical History  Diagnosis Date  . Parkinson's disease      Dr Morene Antu  . Hypertension   . Hypothyroidism     Caused by Amiodarone, resolved  . Orthostatic hypotension     Chronic  . Atrial fibrillation   . Chronic anticoagulation   . BPH (benign prostatic hyperplasia)   . Cancer     hx of skin cancer;? basal cell  . Gastritis   . Shortness of breath   . Pneumonia     hx of   . Blood dyscrasia     chronic anticoagulation  . Dementia    Past Surgical History  Procedure Laterality Date  . Hernia repair       X 4 total  . Appendectomy  Age 41    S/P  rupture  . Knee surgery  Age 5  . US echocardiography  July 2012    EF 55-60%  . Cardiovascular stress test  09-04-2003    EF 66%  . Middle ear surgery     Social History:  reports that he has never smoked. He has never used smokeless tobacco. He reports that he does not drink alcohol or use illicit drugs.  Allergies  Allergen Reactions  . Sulfonamide Derivatives Nausea Only    1957 post knee surgery    Family History  Problem Relation Age of Onset  . Dementia Mother   . Heart disease Father     CHF  . Heart disease Paternal Grandfather     MI  . Cancer Brother     LUNG  . Heart disease Brother     BY-PASS    Prior to Admission medications  Medication Sig Start Date End Date Taking? Authorizing Provider  aspirin 81 MG chewable tablet Chew 81 mg by mouth every morning.    Yes Historical Provider, MD  B Complex-C (B-COMPLEX WITH VITAMIN C) tablet Take 1 tablet by mouth every morning.   Yes Historical Provider, MD  carbidopa-levodopa (SINEMET IR) 25-100 MG per tablet Take 1 tablet by mouth 3 (three) times daily. Take at 0900, 1400, and 2000.   Yes Historical Provider, MD  cholecalciferol (VITAMIN D) 1000 UNITS tablet Take 1,000 Units by mouth every morning.   Yes Historical Provider, MD  citalopram (CELEXA) 20 MG tablet Take 20 mg by mouth every morning.   Yes Historical Provider, MD  digoxin (LANOXIN) 0.125 MG tablet Take 0.125 mg by mouth every morning. Hold if pulse  is <60   Yes Historical Provider, MD  diphenhydramine-acetaminophen (TYLENOL PM) 25-500 MG TABS Take 1 tablet by mouth at bedtime as needed (insomnia).    Yes Historical Provider, MD  ENSURE PLUS (ENSURE PLUS) LIQD Take 237 mLs by mouth 3 (three) times daily - between meals and at bedtime. Vanilla Ensure.   Yes Historical Provider, MD  fludrocortisone (FLORINEF) 0.1 MG tablet Take 0.1 mg by mouth every morning.    Yes Historical Provider, MD  guaifenesin (ROBITUSSIN) 100 MG/5ML syrup Take 200 mg by mouth 3 (three) times daily as needed for cough.   Yes Historical Provider, MD  levothyroxine (SYNTHROID, LEVOTHROID) 50 MCG tablet Take 50 mcg by mouth daily before breakfast.   Yes Historical Provider, MD  loperamide (IMODIUM) 2 MG capsule Take 2 mg by mouth as needed for diarrhea or loose stools.   Yes Historical Provider, MD  LORazepam (ATIVAN) 0.5 MG tablet Take 0.5 mg by mouth every 6 (six) hours as needed (agitation).   Yes Historical Provider, MD  selenium sulfide (SELSUN) 1 % LOTN Apply 1 application topically daily. Wash face and scalp every day for seborrheic dermatitis.   Yes Historical Provider, MD     Physical Exam:  Filed Vitals:   07/28/13 1516 07/28/13 1709  BP: 106/57   Temp: 98 F (36.7 C) 100.5 F (38.1 C)  TempSrc: Axillary Rectal  Resp: 21   SpO2: 94%     Constitutional: Vital signs reviewed.  Elderly thin built male lying in bed poorly arousable and mumbling incoherent words  HEENT: Eyes closed, dry oral mucosa, poor dentition, Cardiovascular: S1 and S2 regular, normal [place] gallop Chest: Coarse breath sounds bilaterally, no wheezes or rhonchi Abdominal: Soft. Non-tender, non-distended, bowel sounds are normal, no masses or guarding  Ext: warm, trace edema  Neurological: A&O x0  Labs on Admission:  Basic Metabolic Panel:  Recent Labs Lab 07/28/13 1538  NA 141  K 4.2  CL 103  CO2 24  GLUCOSE 103*  BUN 22  CREATININE 0.84  CALCIUM 8.3*   Liver Function  Tests:  Recent Labs Lab 07/28/13 1538  AST 31  ALT 7  ALKPHOS 116  BILITOT 1.5*  PROT 6.2  ALBUMIN 2.3*   No results found for this basename: LIPASE, AMYLASE,  in the last 168 hours No results found for this basename: AMMONIA,  in the last 168 hours CBC:  Recent Labs Lab 07/28/13 1538  WBC 12.8*  NEUTROABS 11.0*  HGB 12.6*  HCT 37.6*  MCV 90.8  PLT 316   Cardiac Enzymes:  Recent Labs Lab 07/28/13 1538  TROPONINI <0.30   BNP: No components found with this basename: POCBNP,  CBG: No results found for this basename: GLUCAP,  in the last 168 hours  Radiological Exams on Admission: Ct Head Wo Contrast  07/28/2013   CLINICAL DATA:  80 year old male with altered mental status.  EXAM: CT HEAD WITHOUT CONTRAST  TECHNIQUE: Contiguous axial images were obtained from the base of the skull through the vertex without intravenous contrast.  COMPARISON:  07/21/2013 and multiple prior head CTs  FINDINGS: Mild chronic small-vessel white matter ischemic changes again noted.  No acute intracranial abnormalities are identified, including mass lesion or mass effect, hydrocephalus, extra-axial fluid collection, midline shift, hemorrhage, or acute infarction.  The visualized bony calvarium is unremarkable.  IMPRESSION: No evidence of acute intracranial abnormality.  Mild chronic small-vessel white matter ischemic changes.   Electronically Signed   By: Hassan Rowan M.D.   On: 07/28/2013 17:11   Dg Chest Portable 1 View  07/28/2013   CLINICAL DATA:  COUGH  EXAM: PORTABLE CHEST - 1 VIEW  COMPARISON:  Two-view chest 01/17/2012.  FINDINGS: Visualized course of cardiac silhouette stable. Atherosclerotic calcifications of the aorta. There is diffuse increased density extending from the right mid lung to the lung base. The more dependent portions have a consolidative appearance with linear areas of density superiorly. Left hemithorax is clear. No acute osseous abnormalities. Degenerative changes within the  shoulders.  IMPRESSION: Density in the right lung differential considerations are infiltrate with a component of a pleural effusion. Repeat evaluation with PA and lateral chest radiograph recommended.   Electronically Signed   By: Margaree Mackintosh M.D.   On: 07/28/2013 15:45    EKG: A. fib at 102 with PVCs, QTC of 504  Assessment/Plan  Principal Problem: Pneumonia Likely healthcare associated versus aspiration pneumonia -Admit to MedSurg. -Will place on empiric vancomycin and Zosyn. Check blood culture, urine for strep antigen and Legionella -Gentle hydration with D5 NS -Keep n.p.o.  swallow eval in the morning. -Given  advanced dementia, the patient is at high risk for aspiration. -Repeat 2 view chest x-ray in the morning for better evaluation of the infiltrate and pleural effusion.  Active Problems: Acute encephalopathy Likely secondary to pneumonia and UTI. -Continue neuro checks. hold all oral oral medications. Monitor with antibiotics. Head CT on admission negative for intracranial injury.. -Neuro checks every 4 hours. Has underlying advanced dementia. I will place him on low-dose when necessary ativan for risk of restlessness and agitation. Will avoid Haldol or  seroquel given prolonged QTC.   UTI Follow urine culture. On empiric antibiotics.  Parkinson's disease Has advanced dementia associated with it. Hold his Sinemet while he is n.p.o. Follows with Dr. Dia Sitter with neurology and as per her note  has progressive symptoms with advanced dementia. He is not on any medications like Aricept given episodes of hypotension.    Atrial fibrillation Current in sinus rhythm. On baby aspirin and digoxin. Was on Coumadin previously which was discontinued due to recurrent falls.  ? CHF  patient has trace leg edema and elevated pro BNP. Echo from 2009 shows a normal EF. No history of CHF as per cardiology note from 2014. Check 2-D echo.  Hypotension Patient has had recurrent falls with  his Parkinson's and orthostatic hypotension. He is on Florinef as outpatient.  Dysphagia As per son he is on dysphagia diet at the facility. Currently n.p.o. due to mental status clears and seen by swallow eval    Malnutrition - nutrition consult. Patient is on supplements as outpatient.   Goals of care Had a long discussion with patient's son, daughter and daughter-in-law about goals of care.  Family is aware that patient has progressive dementia and failure to thrive with frequent falls. They informed that patient never wanted to have a feeding tube placed in and that he is a DO NOT RESUSCITATE. I discussed the option about palliative care and long-term goals of care and options for hospice and they would like to discuss this with the palliative care team. I have placed in a palliative care consult for further discussion and help in addressing further [place].  Diet: N.p.o.  DVT prophylaxis: sq lovenox   Code Status: DNR Family Communication: discussed with son and daughter Disposition Plan: Return to skilled nursing facility once stable.   Louellen Molder [facility] Pager [phone]  Total time spent on admission :70 minutes  If 7PM-7AM, please contact night-coverage [email or web address] Password [facility] 07/28/2013, 5:27 PM

## 2013-07-28 NOTE — Progress Notes (Addendum)
ANTIBIOTIC CONSULT NOTE - INITIAL  Pharmacy Consult for Cefepime changed to vancomycin/zosyn per pharmacy Indication: rule out pneumonia  Allergies  Allergen Reactions  . Sulfonamide Derivatives Nausea Only    1957 post knee surgery    Patient Measurements:   Adjusted Body Weight:   Vital Signs: Temp: 98 F (36.7 C) (06/21 1516) Temp src: Axillary (06/21 1516) BP: 106/57 mmHg (06/21 1516) Intake/Output from previous day:   Intake/Output from this shift:    Labs:  Recent Labs  07/28/13 1538  WBC 12.8*  HGB 12.6*  PLT 316   The CrCl is unknown because both a height and weight (above a minimum accepted value) are required for this calculation. No results found for this basename: VANCOTROUGH, VANCOPEAK, VANCORANDOM, GENTTROUGH, GENTPEAK, GENTRANDOM, TOBRATROUGH, TOBRAPEAK, TOBRARND, AMIKACINPEAK, AMIKACINTROU, AMIKACIN,  in the last 72 hours   Microbiology: No results found for this or any previous visit (from the past 720 hour(s)).  Medical History: Past Medical History  Diagnosis Date  . Parkinson's disease     Dr Morene Antu  . Hypertension   . Hypothyroidism     Caused by Amiodarone, resolved  . Orthostatic hypotension     Chronic  . Atrial fibrillation   . Chronic anticoagulation   . BPH (benign prostatic hyperplasia)   . Cancer     hx of skin cancer;? basal cell  . Gastritis   . Shortness of breath   . Pneumonia     hx of   . Blood dyscrasia     chronic anticoagulation  . Dementia     Assessment: 71 YOM presents from NH with lethargy and non-productive cough.  CXR reveals possible R lung infiltrate w/ component of pleural effusion. Vancomycin 1500mg  IV x 1 ordered by EDP with orders for pharmacy to dose cefepime for possible HCAP.   6/21 >> cefepime >> 6/21 >> vancomycin x1 >>    Tmax: WBCs: 12.8 Renal: SCr = 0.84 for CrCl = 51ml/min (N)  6/21 blood: sent / urine:  / sputum:   Goal of Therapy:  Dose appropriately for renal  function  Plan:   Cefepime 2gm IV x 1 then 1gm IV q8h   Doreene Eland, PharmD, BCPS.   Pager: [phone]  07/28/2013,4:13 PM   Addendum  [facility] have changed antibiotics to vancomycin/zosyn for HCAP/aspiration pneumonia and possible UTI.  Antibiotics were not given in ED.    Plan:  D/C cefepime, start zosyn 3.375gm IV q8h over 4h infusion  Continue vancomycin 1500mg  IV x 1 (ED sent with patient to floor), then 1000mg  IV q12h  Check steady state trough based on duration therapy  Monitor renal function  Doreene Eland, PharmD, BCPS.   Pager: [phone] 07/28/2013 5:59 PM

## 2013-07-28 NOTE — ED Notes (Signed)
Patient from [facility], became lethargic today, alert and oriented with a non-productive cough since this morning.  Staff denies fever, nausea, vomiting.

## 2013-07-28 NOTE — ED Provider Notes (Signed)
CSN: [id number]     Arrival date & time 07/28/13  1510 History   First MD Initiated Contact with Patient 07/28/13 1515     Chief Complaint  Patient presents with  . Cough   HPI  Carlos Gonzalez is a 80 y.o. male with a PMH of dementia, atrial fibrillation, parkinson's disease, HTN, hypothyroidism, orthostatic hypotension, chronic anticoagulation (?), BPH, skin cancer, gastritis, SOB, and pneumonia and who presents to the ED for evaluation of a cough. History was provided by the patient. Level V caveat applies. Patient denies any pain. He is able to state his name but mumbles and has non-sensical thinking.   Spoke with Elane Fritz at [facility]. He was not himself today. Increased confusion. Hasn't been eating today. Eyes watery. Patient has been coughing up phlem. Has been unsteady on his feet. Has had generalized weakness. Walks with a walker at baseline, but has not been able to use this today. POA is Royston Sinner his daughter who is coming to the ED.     Past Medical History  Diagnosis Date  . Parkinson's disease     Dr Morene Antu  . Hypertension   . Hypothyroidism     Caused by Amiodarone, resolved  . Orthostatic hypotension     Chronic  . Atrial fibrillation   . Chronic anticoagulation   . BPH (benign prostatic hyperplasia)   . Cancer     hx of skin cancer;? basal cell  . Gastritis   . Shortness of breath   . Pneumonia     hx of   . Blood dyscrasia     chronic anticoagulation  . Dementia    Past Surgical History  Procedure Laterality Date  . Hernia repair       X 4 total  . Appendectomy  Age 73    S/P  rupture  . Knee surgery  Age 67  . US echocardiography  July 2012    EF 55-60%  . Cardiovascular stress test  09-04-2003    EF 66%  . Middle ear surgery     Family History  Problem Relation Age of Onset  . Dementia Mother   . Heart disease Father     CHF  . Heart disease Paternal Grandfather     MI  . Cancer Brother     LUNG  . Heart disease  Brother     BY-PASS   History  Substance Use Topics  . Smoking status: Never Smoker   . Smokeless tobacco: Never Used  . Alcohol Use: No     Comment: very rarely    Review of Systems  Unable to perform ROS   Allergies  Sulfonamide derivatives  Home Medications   Prior to Admission medications   Medication Sig Start Date End Date Taking? Authorizing Provider  aspirin 81 MG chewable tablet Chew 81 mg by mouth daily.    Historical Provider, MD  carbidopa-levodopa (SINEMET IR) 25-100 MG per tablet Take 1 tablet by mouth 4 (four) times daily.    Historical Provider, MD  citalopram (CELEXA) 20 MG tablet Take 20 mg by mouth every morning.    Historical Provider, MD  digoxin (LANOXIN) 0.125 MG tablet Take 0.125 mg by mouth daily.    Historical Provider, MD  diphenhydramine-acetaminophen (TYLENOL PM) 25-500 MG TABS Take 1 tablet by mouth at bedtime as needed (for insomnia).    Historical Provider, MD  ENSURE (ENSURE) Take 1 Can by mouth 3 (three) times daily - between meals and  at bedtime.    Historical Provider, MD  fludrocortisone (FLORINEF) 0.1 MG tablet Take 0.1 mg by mouth daily.    Historical Provider, MD  levothyroxine (SYNTHROID, LEVOTHROID) 50 MCG tablet Take 50 mcg by mouth daily before breakfast.    Historical Provider, MD  loperamide (IMODIUM) 2 MG capsule Take 2 mg by mouth as needed for diarrhea or loose stools.    Historical Provider, MD  LORazepam (ATIVAN) 0.5 MG tablet Take 0.25 mg by mouth at bedtime.     Historical Provider, MD  QUEtiapine (SEROQUEL) 25 MG tablet Take 25 mg by mouth at bedtime. 05/24/12   Star Age, MD  selenium sulfide (SELSUN) 1 % LOTN Apply 1 application topically every morning. Wash face and scalp everyday    Historical Provider, MD   BP 106/57  Temp(Src) 98 F (36.7 C) (Axillary)  Resp 21  SpO2 94%  Filed Vitals:   07/28/13 1516 07/28/13 1709 07/28/13 1737 07/28/13 2104  BP: 106/57  104/65 116/62  Pulse:   96 94  Temp: 98 F (36.7 C)  100.5 F (38.1 C) 98.1 F (36.7 C) 98.7 F (37.1 C)  TempSrc: Axillary Rectal Oral Oral  Resp: 21   20  Height:   5' 8.9" (1.75 m)   Weight:   155 lb 11.2 oz (70.625 kg)   SpO2: 94%  93% 94%    Physical Exam  Nursing note and vitals reviewed. Constitutional: He appears well-developed and well-nourished. No distress.  HENT:  Head: Normocephalic and atraumatic.  Right Ear: External ear normal.  Left Ear: External ear normal.  Eyes:  Patient has eyes closed.   Neck: Normal range of motion. Neck supple.  Cardiovascular: Normal heart sounds and intact distal pulses.  Exam reveals no gallop and no friction rub.   No murmur heard. Irregular rate and rhythm  Pulmonary/Chest: Effort normal and breath sounds normal. No respiratory distress. He has no wheezes. He has no rales. He exhibits no tenderness.  Abdominal: Soft. He exhibits no distension and no mass. There is no tenderness. There is no rebound and no guarding.  Musculoskeletal: Normal range of motion. He exhibits edema. He exhibits no tenderness.  Pitting 1+ pedal and pre-tibial edema bilaterally  Neurological: He is alert.  Neuro exam limited. Patient oriented to self. Unable to determine place or time. Patient verbally responsive. Unable to follow some commands. No distress. No obvious facial droop or asymmetry.   Skin: Skin is warm and dry. He is not diaphoretic.    ED Course  Procedures (including critical care time) Labs Review Labs Reviewed - No data to display  Imaging Review Ct Head Wo Contrast  07/28/2013   CLINICAL DATA:  80 year old male with altered mental status.  EXAM: CT HEAD WITHOUT CONTRAST  TECHNIQUE: Contiguous axial images were obtained from the base of the skull through the vertex without intravenous contrast.  COMPARISON:  07/21/2013 and multiple prior head CTs  FINDINGS: Mild chronic small-vessel white matter ischemic changes again noted.  No acute intracranial abnormalities are identified, including mass  lesion or mass effect, hydrocephalus, extra-axial fluid collection, midline shift, hemorrhage, or acute infarction.  The visualized bony calvarium is unremarkable.  IMPRESSION: No evidence of acute intracranial abnormality.  Mild chronic small-vessel white matter ischemic changes.   Electronically Signed   By: Hassan Rowan M.D.   On: 07/28/2013 17:11   Dg Chest Portable 1 View  07/28/2013   CLINICAL DATA:  COUGH  EXAM: PORTABLE CHEST - 1 VIEW  COMPARISON:  Two-view  chest 01/17/2012.  FINDINGS: Visualized course of cardiac silhouette stable. Atherosclerotic calcifications of the aorta. There is diffuse increased density extending from the right mid lung to the lung base. The more dependent portions have a consolidative appearance with linear areas of density superiorly. Left hemithorax is clear. No acute osseous abnormalities. Degenerative changes within the shoulders.  IMPRESSION: Density in the right lung differential considerations are infiltrate with a component of a pleural effusion. Repeat evaluation with PA and lateral chest radiograph recommended.   Electronically Signed   By: Margaree Mackintosh M.D.   On: 07/28/2013 15:45     EKG Interpretation   Date/Time:  Sunday July 28 2013 15:30:42 EDT Ventricular Rate:  102 PR Interval:    QRS Duration: 77 QT Interval:  387 QTC Calculation: 504 R Axis:   60 Text Interpretation:  Atrial fibrillation Ventricular premature complex  Borderline repolarization abnormality Prolonged QT interval Baseline  wander in lead(s) V3 Similar to prior Confirmed by Mingo Amber  MD, Hoskins  ([id number]) on 07/28/2013 3:38:28 PM      Results for orders placed during the hospital encounter of 07/28/13  TROPONIN I      Result Value Ref Range   Troponin I <0.30  <0.30 ng/mL  URINALYSIS, ROUTINE W REFLEX MICROSCOPIC      Result Value Ref Range   Color, Urine ORANGE (*) YELLOW   APPearance CLEAR  CLEAR   Specific Gravity, Urine 1.028  1.005 - 1.030   pH 5.5  5.0 - 8.0   Glucose, UA  NEGATIVE  NEGATIVE mg/dL   Hgb urine dipstick NEGATIVE  NEGATIVE   Bilirubin Urine SMALL (*) NEGATIVE   Ketones, ur NEGATIVE  NEGATIVE mg/dL   Protein, ur NEGATIVE  NEGATIVE mg/dL   Urobilinogen, UA 2.0 (*) 0.0 - 1.0 mg/dL   Nitrite NEGATIVE  NEGATIVE   Leukocytes, UA TRACE (*) NEGATIVE  CBC WITH DIFFERENTIAL      Result Value Ref Range   WBC 12.8 (*) 4.0 - 10.5 K/uL   RBC 4.14 (*) 4.22 - 5.81 MIL/uL   Hemoglobin 12.6 (*) 13.0 - 17.0 g/dL   HCT 37.6 (*) 39.0 - 52.0 %   MCV 90.8  78.0 - 100.0 fL   MCH 30.4  26.0 - 34.0 pg   MCHC 33.5  30.0 - 36.0 g/dL   RDW 13.0  11.5 - 15.5 %   Platelets 316  150 - 400 K/uL   Neutrophils Relative % 86 (*) 43 - 77 %   Neutro Abs 11.0 (*) 1.7 - 7.7 K/uL   Lymphocytes Relative 6 (*) 12 - 46 %   Lymphs Abs 0.8  0.7 - 4.0 K/uL   Monocytes Relative 8  3 - 12 %   Monocytes Absolute 1.0  0.1 - 1.0 K/uL   Eosinophils Relative 0  0 - 5 %   Eosinophils Absolute 0.0  0.0 - 0.7 K/uL   Basophils Relative 0  0 - 1 %   Basophils Absolute 0.0  0.0 - 0.1 K/uL  COMPREHENSIVE METABOLIC PANEL      Result Value Ref Range   Sodium 141  137 - 147 mEq/L   Potassium 4.2  3.7 - 5.3 mEq/L   Chloride 103  96 - 112 mEq/L   CO2 24  19 - 32 mEq/L   Glucose, Bld 103 (*) 70 - 99 mg/dL   BUN 22  6 - 23 mg/dL   Creatinine, Ser 0.84  0.50 - 1.35 mg/dL   Calcium 8.3 (*) 8.4 -  10.5 mg/dL   Total Protein 6.2  6.0 - 8.3 g/dL   Albumin 2.3 (*) 3.5 - 5.2 g/dL   AST 31  0 - 37 U/L   ALT 7  0 - 53 U/L   Alkaline Phosphatase 116  39 - 117 U/L   Total Bilirubin 1.5 (*) 0.3 - 1.2 mg/dL   GFR calc non Af Amer 80 (*) >90 mL/min   GFR calc Af Amer >90  >90 mL/min  PROTIME-INR      Result Value Ref Range   Prothrombin Time 16.7 (*) 11.6 - 15.2 seconds   INR 1.39  0.00 - 1.49  APTT      Result Value Ref Range   aPTT 32  24 - 37 seconds  DIGOXIN LEVEL      Result Value Ref Range   Digoxin Level 0.6 (*) 0.8 - 2.0 ng/mL  PRO B NATRIURETIC PEPTIDE      Result Value Ref Range   Pro  B Natriuretic peptide (BNP) 4617.0 (*) 0 - 450 pg/mL  URINE MICROSCOPIC-ADD ON      Result Value Ref Range   WBC, UA 0-2  <3 WBC/hpf   Bacteria, UA RARE  RARE   Urine-Other MUCOUS PRESENT     MDM   Carlos Gonzalez is a 80 y.o. male with a PMH of atrial fibrillation, parkinson's disease, HTN, hypothyroidism, orthostatic hypotension, chronic anticoagulation, BPH, skin cancer, gastritis, SOB, pneumonia, and dementia who presents to the ED for evaluation of a cough. Patient found to have an HAP. Portable chest x-ray showed a density in the right lung which may be due to an infiltrate vs pleural effusion. Mild leukocytosis (12.8). Low grade fever of 100.5 rectally. Blood cultures drawn. Patient started on vancomycin and cefepime. Patient has hx of dementia with increased confusion from baseline. Neurological exam limited due to cooperation. Head CT negative for an acute intracranial process. UA possible UTI. Urine sent for culture. Patient admitted for further evaluation and management.    Final impressions: 1. HCAP (healthcare-associated pneumonia)   2. Acute encephalopathy   3. Dementia, without behavioral disturbance       Mercy Moore PA-C   This patient was discussed with Dr. Leia Alf, PA-C 07/29/13 [id number]

## 2013-07-29 ENCOUNTER — Inpatient Hospital Stay (HOSPITAL_COMMUNITY): Payer: Medicare HMO

## 2013-07-29 DIAGNOSIS — J189 Pneumonia, unspecified organism: Secondary | ICD-10-CM

## 2013-07-29 DIAGNOSIS — J690 Pneumonitis due to inhalation of food and vomit: Principal | ICD-10-CM

## 2013-07-29 DIAGNOSIS — G9340 Encephalopathy, unspecified: Secondary | ICD-10-CM

## 2013-07-29 LAB — CBC
HEMATOCRIT: 34.3 % — AB (ref 39.0–52.0)
Hemoglobin: 11.2 g/dL — ABNORMAL LOW (ref 13.0–17.0)
MCH: 30.1 pg (ref 26.0–34.0)
MCHC: 32.7 g/dL (ref 30.0–36.0)
MCV: 92.2 fL (ref 78.0–100.0)
PLATELETS: 292 10*3/uL (ref 150–400)
RBC: 3.72 MIL/uL — ABNORMAL LOW (ref 4.22–5.81)
RDW: 13 % (ref 11.5–15.5)
WBC: 10.6 10*3/uL — AB (ref 4.0–10.5)

## 2013-07-29 LAB — BASIC METABOLIC PANEL
BUN: 24 mg/dL — ABNORMAL HIGH (ref 6–23)
CALCIUM: 8 mg/dL — AB (ref 8.4–10.5)
CHLORIDE: 106 meq/L (ref 96–112)
CO2: 24 mEq/L (ref 19–32)
CREATININE: 0.94 mg/dL (ref 0.50–1.35)
GFR calc non Af Amer: 76 mL/min — ABNORMAL LOW (ref >90)
GFR, EST AFRICAN AMERICAN: 88 mL/min — AB (ref >90)
Glucose, Bld: 114 mg/dL — ABNORMAL HIGH (ref 70–99)
Potassium: 3.9 mEq/L (ref 3.7–5.3)
Sodium: 141 mEq/L (ref 137–147)

## 2013-07-29 LAB — LEGIONELLA ANTIGEN, URINE: Legionella Antigen, Urine: NEGATIVE

## 2013-07-29 LAB — STREP PNEUMONIAE URINARY ANTIGEN: Strep Pneumo Urinary Antigen: NEGATIVE

## 2013-07-29 LAB — MRSA PCR SCREENING: MRSA by PCR: NEGATIVE

## 2013-07-29 LAB — HIV ANTIBODY (ROUTINE TESTING W REFLEX): HIV: NONREACTIVE

## 2013-07-29 MED ORDER — ATROPINE SULFATE 1 % OP SOLN
2.0000 [drp] | OPHTHALMIC | Status: DC | PRN
  Filled 2013-07-29: qty 2

## 2013-07-29 MED ORDER — HALOPERIDOL LACTATE 5 MG/ML IJ SOLN
2.0000 mg | Freq: Four times a day (QID) | INTRAMUSCULAR | Status: DC | PRN
  Administered 2013-07-29 – 2013-07-30 (×2): 2 mg via INTRAVENOUS
  Filled 2013-07-29 (×3): qty 1

## 2013-07-29 MED ORDER — BISACODYL 10 MG RE SUPP: 10.0000 mg | Freq: Every day | RECTAL | Status: DC | PRN

## 2013-07-29 MED ORDER — MORPHINE SULFATE 2 MG/ML IJ SOLN: 2.0000 mg | INTRAMUSCULAR | Status: DC | PRN

## 2013-07-29 MED ORDER — SODIUM CHLORIDE 0.9 % IV SOLN
3.0000 g | Freq: Four times a day (QID) | INTRAVENOUS | Status: DC
  Administered 2013-07-29: 3 g via INTRAVENOUS
  Filled 2013-07-29 (×2): qty 3

## 2013-07-29 MED ORDER — LORAZEPAM 2 MG/ML IJ SOLN: 1.0000 mg | Freq: Once | INTRAMUSCULAR | Status: DC

## 2013-07-29 MED ORDER — LORAZEPAM 2 MG/ML IJ SOLN
1.0000 mg | INTRAMUSCULAR | Status: DC | PRN
  Administered 2013-07-30: 1 mg via INTRAVENOUS
  Filled 2013-07-29: qty 1

## 2013-07-29 MED ORDER — ARTIFICIAL TEARS OP OINT
TOPICAL_OINTMENT | Freq: Every day | OPHTHALMIC | Status: DC
  Administered 2013-07-29: 23:00:00 via OPHTHALMIC
  Filled 2013-07-29: qty 3.5

## 2013-07-29 MED ORDER — VANCOMYCIN HCL IN DEXTROSE 750-5 MG/150ML-% IV SOLN: 750.0000 mg | Freq: Two times a day (BID) | INTRAVENOUS | Status: DC

## 2013-07-29 NOTE — Progress Notes (Signed)
ANTIBIOTIC CONSULT NOTE - Follow up  Pharmacy Consult for Zosyn, Vancomycin Indication: rule out pneumonia  Allergies  Allergen Reactions  . Sulfonamide Derivatives Nausea Only    1957 post knee surgery    Patient Measurements: Height: 5' 8.9" (175 cm) Weight: 155 lb 11.2 oz (70.625 kg) IBW/kg (Calculated) : 70.47  Vital Signs: Temp: 98.4 F (36.9 C) (06/22 0430) Temp src: Axillary (06/22 0430) BP: 93/57 mmHg (06/22 0430) Pulse Rate: 102 (06/22 0430) Intake/Output from previous day: 06/21 0701 - 06/22 0700 In: -  Out: 100 [Urine:100]  Labs:  Recent Labs  07/28/13 1538 07/29/13 0450  WBC 12.8* 10.6*  HGB 12.6* 11.2*  PLT 316 292  CREATININE 0.84 0.94   Estimated Creatinine Clearance: 61.5 ml/min (by C-G formula based on Cr of 0.94). No results found for this basename: Letta Median, VANCORANDOM, GENTTROUGH, GENTPEAK, GENTRANDOM, TOBRATROUGH, TOBRAPEAK, TOBRARND, AMIKACINPEAK, AMIKACINTROU, AMIKACIN,  in the last 72 hours     Anti-infectives: 6/21 >> Zosyn >>  6/21 >> vancomycin >>    Assessment: 83 YOM presents from NH with lethargy and non-productive cough.  CXR reveals possible R lung infiltrate w/ component of pleural effusion. Pharmacy consulted to dose vancomycin and Zosyn for HCAP/aspiration pneumonia and possible UTI.   6/22:  Day #2 Vancomycin and Zosyn  Tmax: 100.5  WBCs: 12.8 > 10.6  Renal: SCr 0.94, CrCl ~ 61 ml/min  Cultures pending.   Goal of Therapy:  Appropriate abx dosing, eradication of infection.   Plan:   Zosyn 3.375g IV Q8H infused over 4hrs.  Vancomycin 750mg  IV q12 h.  Measure Vanc trough at steady state.  Follow up renal fxn and culture results.   Gretta Arab PharmD, BCPS Pager [phone] 07/29/2013 9:23 AM

## 2013-07-29 NOTE — Progress Notes (Signed)
Clinical Social Work  CSW received a consult due to patient being admitted from a facility. CSW went to room but patient sleeping and no family present. Per RN, PMT attempting to schedule a GOC meeting this afternoon. CSW will continue to follow and will complete full assessment at later time.  [place], [place] [phone]

## 2013-07-29 NOTE — Progress Notes (Signed)
[facility] PROGRESS NOTE  Carlos Gonzalez SPQ:[id number] DOB: [birth year] DOA: 07/28/2013 PCP: MAZZOCCHI, Reggy Eye, MD  Assessment/Plan: Acute Encephalopathy -2/2 PNA on top of baseline dementia. -Per son and daughter still far from his baseline. -See below for further details.  HCAP/Aspiration PNA -From history, appears to be more of an aspiration issue. -ST recommending NPO for now. -Will change abx to unasyn to better cover anaerobes. (not from SNF but ALF). -Await cx data.  ?UTI -UA not impressive. -Await cx data. -Abx for PNA should suffice.  Goals of Care -Long discussion with son and daughter today. -They agree with treating this episode of PNA, but would like to limit aggressive interventions and would be interested in withdrawing care in the future if his mentation does not improve with treatment of his acute infections or if he has repeated episodes of aspiration PNA. Would not want a feeding tube. -DNR is already established. -Son very interested in palliative care evaluation to "keep all avenues open".  Code Status: DNR Family Communication: Son and daughter  Disposition Plan: Likely back to ALF once medically ready   Consultants:  None   Antibiotics:  Unasyn   Subjective: Eyes shut. "Come help me, momma". Confused.  Objective: Filed Vitals:   07/28/13 1709 07/28/13 1737 07/28/13 2104 07/29/13 0430  BP:  104/65 116/62 93/57  Pulse:  96 94 102  Temp: 100.5 F (38.1 C) 98.1 F (36.7 C) 98.7 F (37.1 C) 98.4 F (36.9 C)  TempSrc: Rectal Oral Oral Axillary  Resp:   20 20  Height:  5' 8.9" (1.75 m)    Weight:  70.625 kg (155 lb 11.2 oz)    SpO2:  93% 94% 91%    Intake/Output Summary (Last 24 hours) at 07/29/13 1411 Last data filed at 07/28/13 1615  Gross per 24 hour  Intake      0 ml  Output    100 ml  Net   -100 ml   Filed Weights   07/28/13 1737  Weight: 70.625 kg (155 lb 11.2 oz)    Exam:   General:   confused  Cardiovascular: RRR  Respiratory: bilateral ronchi  Abdomen: S/NT/ND/+BS  Extremities: trace bilateral edema    Neurologic:  Moves all 4 spontaneously  Data Reviewed: Basic Metabolic Panel:  Recent Labs Lab 07/28/13 1538 07/29/13 0450  NA 141 141  K 4.2 3.9  CL 103 106  CO2 24 24  GLUCOSE 103* 114*  BUN 22 24*  CREATININE 0.84 0.94  CALCIUM 8.3* 8.0*   Liver Function Tests:  Recent Labs Lab 07/28/13 1538  AST 31  ALT 7  ALKPHOS 116  BILITOT 1.5*  PROT 6.2  ALBUMIN 2.3*   No results found for this basename: LIPASE, AMYLASE,  in the last 168 hours No results found for this basename: AMMONIA,  in the last 168 hours CBC:  Recent Labs Lab 07/28/13 1538 07/29/13 0450  WBC 12.8* 10.6*  NEUTROABS 11.0*  --   HGB 12.6* 11.2*  HCT 37.6* 34.3*  MCV 90.8 92.2  PLT 316 292   Cardiac Enzymes:  Recent Labs Lab 07/28/13 1538  TROPONINI <0.30   BNP (last 3 results)  Recent Labs  07/28/13 1538  PROBNP 4617.0*   CBG: No results found for this basename: GLUCAP,  in the last 168 hours  Recent Results (from the past 240 hour(s))  CULTURE, BLOOD (ROUTINE X 2)     Status: None   Collection Time    07/28/13  5:03 PM  Result Value Ref Range Status   Specimen Description BLOOD RIGHT WRIST  3 ML IN AEROBIC ONLY   Final   Special Requests Normal   Final   Culture  Setup Time     Final   Value: 07/28/2013 22:01     Performed at [facility]   Culture     Final   Value:        BLOOD CULTURE RECEIVED NO GROWTH TO DATE CULTURE WILL BE HELD FOR 5 DAYS BEFORE ISSUING A FINAL NEGATIVE REPORT     Performed at [facility]   Report Status PENDING   Incomplete  CULTURE, BLOOD (ROUTINE X 2)     Status: None   Collection Time    07/28/13  5:03 PM      Result Value Ref Range Status   Specimen Description BLOOD RIGHT HAND  5 ML IN [facility] BOTTLE   Final   Special Requests Normal   Final   Culture  Setup Time     Final   Value: 07/28/2013  22:01     Performed at [facility]   Culture     Final   Value:        BLOOD CULTURE RECEIVED NO GROWTH TO DATE CULTURE WILL BE HELD FOR 5 DAYS BEFORE ISSUING A FINAL NEGATIVE REPORT     Performed at [facility]   Report Status PENDING   Incomplete  MRSA PCR SCREENING     Status: None   Collection Time    07/29/13  5:52 AM      Result Value Ref Range Status   MRSA by PCR NEGATIVE  NEGATIVE Final   Comment:            The GeneXpert MRSA Assay (FDA     approved for NASAL specimens     only), is one component of a     comprehensive MRSA colonization     surveillance program. It is not     intended to diagnose MRSA     infection nor to guide or     monitor treatment for     MRSA infections.     Studies: Dg Chest 2 View  07/29/2013   CLINICAL DATA:  Cough, congestion, shortness of breath  EXAM: CHEST  2 VIEW  COMPARISON:  07/28/2013  FINDINGS: Cardiomediastinal silhouette is stable. Persistent right pleural effusion with right lower lobe atelectasis or infiltrate. Left lung is clear. No pulmonary edema.  IMPRESSION: Persistent moderate right pleural effusion with right lower lobe atelectasis or infiltrate. No pulmonary edema.   Electronically Signed   By: Lahoma Crocker M.D.   On: 07/29/2013 08:51   Ct Head Wo Contrast  07/28/2013   CLINICAL DATA:  80 year old male with altered mental status.  EXAM: CT HEAD WITHOUT CONTRAST  TECHNIQUE: Contiguous axial images were obtained from the base of the skull through the vertex without intravenous contrast.  COMPARISON:  07/21/2013 and multiple prior head CTs  FINDINGS: Mild chronic small-vessel white matter ischemic changes again noted.  No acute intracranial abnormalities are identified, including mass lesion or mass effect, hydrocephalus, extra-axial fluid collection, midline shift, hemorrhage, or acute infarction.  The visualized bony calvarium is unremarkable.  IMPRESSION: No evidence of acute intracranial abnormality.  Mild chronic  small-vessel white matter ischemic changes.   Electronically Signed   By: Hassan Rowan M.D.   On: 07/28/2013 17:11   Dg Chest Portable 1 View  07/28/2013   CLINICAL DATA:  COUGH  EXAM: PORTABLE CHEST - 1 VIEW  COMPARISON:  Two-view chest 01/17/2012.  FINDINGS: Visualized course of cardiac silhouette stable. Atherosclerotic calcifications of the aorta. There is diffuse increased density extending from the right mid lung to the lung base. The more dependent portions have a consolidative appearance with linear areas of density superiorly. Left hemithorax is clear. No acute osseous abnormalities. Degenerative changes within the shoulders.  IMPRESSION: Density in the right lung differential considerations are infiltrate with a component of a pleural effusion. Repeat evaluation with PA and lateral chest radiograph recommended.   Electronically Signed   By: Margaree Mackintosh M.D.   On: 07/28/2013 15:45    Scheduled Meds: . acetaminophen  650 mg Rectal Once  . enoxaparin (LOVENOX) injection  40 mg Subcutaneous Q24H  . LORazepam  1 mg Intravenous Once  . piperacillin-tazobactam (ZOSYN)  IV  3.375 g Intravenous 3 times per day  . selenium sulfide  1 application Topical Daily  . vancomycin  750 mg Intravenous Q12H   Continuous Infusions: . dextrose 5 % and 0.9% NaCl 75 mL/hr at 07/28/13 2057    Principal Problem:   HCAP (healthcare-associated pneumonia) Active Problems:   Acute encephalopathy   HYPOTHYROIDISM   PARKINSON'S DISEASE   Atrial fibrillation   DYSPHAGIA UNSPECIFIED   BENIGN PROSTATIC HYPERTROPHY, HX OF   Malnutrition   Dementia   Healthcare associated bacterial pneumonia   Aspiration pneumonia    Time spent: 45 minutes. Greater than 50% of this time was spent in direct contact with the patient coordinating care.    Lelon Frohlich  [facility] Pager [phone]  If 7PM-7AM, please contact night-coverage at [email or web address], password [facility] 07/29/2013, 2:11 PM  LOS: 1 day

## 2013-07-29 NOTE — Progress Notes (Signed)
INITIAL NUTRITION ASSESSMENT  DOCUMENTATION CODES Per approved criteria  -Severe malnutrition in the context of chronic illness  Patient appears to be severely malnourished related to chronic illness AEB 10% weight loss in the past 6 months, decrease muscle mass and body fat.  INTERVENTION: Based on plan of care RD to monitor  NUTRITION DIAGNOSIS: Inadequate oral intake related to inability to eat as evidenced by npo status.   Goal: Based on plan of care  Monitor:  Plan of care  Reason for Assessment: MST  80 y.o. male  Admitting Dx: HCAP (healthcare-associated pneumonia)  ASSESSMENT: Patient admitted with Aspiration Pneumonia with acute encephalopath.  Hx includes dementia and is far from baseline per family.  Patient from ALF.  Family does not want TF.  Palliative care to see to evaluate goals of care.    Height: Ht Readings from Last 1 Encounters:  07/28/13 5' 8.9" (1.75 m)    Weight: Wt Readings from Last 1 Encounters:  07/28/13 155 lb 11.2 oz (70.625 kg)    Ideal Body Weight: 160 lbs  % Ideal Body Weight: 97  Wt Readings from Last 10 Encounters:  07/28/13 155 lb 11.2 oz (70.625 kg)  01/14/13 172 lb (78.019 kg)  08/24/12 168 lb (76.204 kg)  05/24/12 164 lb (74.39 kg)  04/19/12 178 lb 12.8 oz (81.103 kg)  01/18/12 169 lb 12.8 oz (77.021 kg)  01/17/12 170 lb (77.111 kg)  12/15/11 170 lb 6.4 oz (77.293 kg)  12/05/11 175 lb 9.6 oz (79.652 kg)  09/02/11 171 lb 6.4 oz (77.747 kg)    Usual Body Weight: 172 lbs 6 months ago  % Usual Body Weight: 90  BMI:  Body mass index is 23.06 kg/(m^2).  Estimated Nutritional Needs: Kcal: 1850-1950 Protein: 80-90 gm Fluid: 1.9L daily  Skin: black spots  Diet Order: NPO  EDUCATION NEEDS: -No education needs identified at this time  No intake or output data in the 24 hours ending 07/29/13 1637   Labs:   Recent Labs Lab 07/28/13 1538 07/29/13 0450  NA 141 141  K 4.2 3.9  CL 103 106  CO2 24 24  BUN 22  24*  CREATININE 0.84 0.94  CALCIUM 8.3* 8.0*  GLUCOSE 103* 114*    CBG (last 3)  No results found for this basename: GLUCAP,  in the last 72 hours  Scheduled Meds: . acetaminophen  650 mg Rectal Once  . ampicillin-sulbactam (UNASYN) IV  3 g Intravenous Q6H  . enoxaparin (LOVENOX) injection  40 mg Subcutaneous Q24H  . LORazepam  1 mg Intravenous Once  . selenium sulfide  1 application Topical Daily    Continuous Infusions: . dextrose 5 % and 0.9% NaCl 75 mL/hr at 07/28/13 2057    Past Medical History  Diagnosis Date  . Parkinson's disease     Dr Morene Antu  . Hypertension   . Hypothyroidism     Caused by Amiodarone, resolved  . Orthostatic hypotension     Chronic  . Atrial fibrillation   . Chronic anticoagulation   . BPH (benign prostatic hyperplasia)   . Cancer     hx of skin cancer;? basal cell  . Gastritis   . Shortness of breath   . Pneumonia     hx of   . Blood dyscrasia     chronic anticoagulation  . Dementia     Past Surgical History  Procedure Laterality Date  . Hernia repair       X 4 total  . Appendectomy  Age 39    S/P  rupture  . Knee surgery  Age 11  . US echocardiography  July 2012    EF 55-60%  . Cardiovascular stress test  09-04-2003    EF 66%  . Middle ear surgery      Antonieta Iba, RD, LDN Clinical Inpatient Dietitian Pager:  [phone] Weekend and after hours pager:  [phone]

## 2013-07-29 NOTE — Consult Note (Signed)
Palliative Medicine Team at [facility]  Date: 07/29/2013   Patient Name: IZZAK Gonzalez  DOB: 01/23/1931  MRN: [id number]  Age / Sex: 80 y.o., male   PCP: Suszanne Conners, MD Referring Physician: Koleen Nimrod Acost*  Active Problems: Principal Problem:   HCAP (healthcare-associated pneumonia) Active Problems:   HYPOTHYROIDISM   PARKINSON'S DISEASE   Atrial fibrillation   DYSPHAGIA UNSPECIFIED   BENIGN PROSTATIC HYPERTROPHY, HX OF   Acute encephalopathy   Malnutrition   Dementia   Healthcare associated bacterial pneumonia   Aspiration pneumonia   HPI/Reason for Consultation: 80 yo man with end stage Parkinson's Dementia from memory care unit at the [facility] admitted with probable aspiration PNA vs. HCAP. He has 6 ED visits in the last 6 months related to frequent falls, he has a 10% weight loss in the past 6 months and worsening agitation. Currently he is being treated with IV antibiotics, he is agitated, will not open his eyes or interact meaningfully. He is combative with personal care. PMT consulted by admitting physician for goals of care.   Participants in Discussion: Patient's son Mikki Santee, his DIL Eustaquio Maize and his daughter Manuela Schwartz.  Goals/Summary of Case:  [name] family is exhausted-his decline really started in 2013 when he went through a divorce with his second wife, moved into [place] and had a severe depression- his disease worsened and a year after moving into [place] was transitioned to memory care at the [facility] where he has been for the past year- his decline has been marked- agitation, frequent falls, dysphagia and his family is exhausted from managing his care- they feel that he is suffering and has been for at least the past year.    Advance Directive: Completed but not on file   Code Status Orders        Start     Ordered   07/28/13 1746  Do not attempt resuscitation (DNR)   Continuous     07/28/13 1745      I have  reviewed the medical record, interviewed the patient and family, and examined the patient. The following aspects are pertinent.  Past Medical History  Diagnosis Date  . Parkinson's disease     Dr Morene Antu  . Hypertension   . Hypothyroidism     Caused by Amiodarone, resolved  . Orthostatic hypotension     Chronic  . Atrial fibrillation   . Chronic anticoagulation   . BPH (benign prostatic hyperplasia)   . Cancer     hx of skin cancer;? basal cell  . Gastritis   . Shortness of breath   . Pneumonia     hx of   . Blood dyscrasia     chronic anticoagulation  . Dementia     History   Social History  . Marital Status: Legally Separated    Spouse Name: N/A    Number of Children: 2  . Years of Education: Undergrad   Occupational History  . RETIRED    Social History Main Topics  . Smoking status: Never Smoker   . Smokeless tobacco: Never Used  . Alcohol Use: No     Comment: very rarely  . Drug Use: No  . Sexual Activity: No   Other Topics Concern  . None   Social History Narrative   Patient lives in [place]   NO EXERCISE    Family History  Problem Relation Age of Onset  . Dementia Mother   .  Heart disease Father     CHF  . Heart disease Paternal Grandfather     MI  . Cancer Brother     LUNG  . Heart disease Brother     BY-PASS    Scheduled Meds: . acetaminophen  650 mg Rectal Once  . ampicillin-sulbactam (UNASYN) IV  3 g Intravenous Q6H  . enoxaparin (LOVENOX) injection  40 mg Subcutaneous Q24H  . LORazepam  1 mg Intravenous Once  . selenium sulfide  1 application Topical Daily   Continuous Infusions: . dextrose 5 % and 0.9% NaCl 75 mL/hr at 07/28/13 2057   PRN Meds:. Allergies  Allergen Reactions  . Sulfonamide Derivatives Nausea Only    1957 post knee surgery   CBC:    Component Value Date/Time   WBC 10.6* 07/29/2013 0450   HGB 11.2* 07/29/2013 0450   HCT 34.3* 07/29/2013 0450   PLT 292 07/29/2013 0450   MCV 92.2  07/29/2013 0450   NEUTROABS 11.0* 07/28/2013 1538   LYMPHSABS 0.8 07/28/2013 1538   MONOABS 1.0 07/28/2013 1538   EOSABS 0.0 07/28/2013 1538   BASOSABS 0.0 07/28/2013 1538    Comprehensive Metabolic Panel:    Component Value Date/Time   NA 141 07/29/2013 0450   K 3.9 07/29/2013 0450   CL 106 07/29/2013 0450   CO2 24 07/29/2013 0450   BUN 24* 07/29/2013 0450   CREATININE 0.94 07/29/2013 0450   GLUCOSE 114* 07/29/2013 0450   CALCIUM 8.0* 07/29/2013 0450   AST 31 07/28/2013 1538   ALT 7 07/28/2013 1538   ALKPHOS 116 07/28/2013 1538   BILITOT 1.5* 07/28/2013 1538   PROT 6.2 07/28/2013 1538   ALBUMIN 2.3* 07/28/2013 1538   Vital Signs: BP 139/86  Pulse 93  Temp(Src) 97.1 F (36.2 C) (Axillary)  Resp 18  Ht 5' 8.9" (1.75 m)  Wt 70.625 kg (155 lb 11.2 oz)  BMI 23.06 kg/m2  SpO2 95% Filed Weights   07/28/13 1737  Weight: 70.625 kg (155 lb 11.2 oz)   Very frail, picking at things in the air, opens his eye intermittently, intermitytently follows commands, bites down on toothette and spoon, agitated moderate to severe, pulled off his condom catheter, scrape on the side of his face  Assessment/Prognosis: Primary Diagnoses  1. Aspiration PNA in setting of End-stage Parkinson's Dementia 2. Malnutrition, weight loss Active Symptoms:  Moderate to severe delirium/agitation  Pain, non-verbal secondary to frequent falls and compression fractures  Dyspnea, secondary to PNA with congestion/cough  Prognosis: <2 weeks, minimal PO intake-suspect dysphagia with be permanent PPS 20%   Scope of Treatment / Recommendations:  Family very thoughtful in their decision making- they have decided after careful consideration to transition his to full comfort care and the compassionate choice given his current state of health and QOL-they spent a lot of time talking about his decline and how painful it has been to watch-they also said the patient himself would have never wanted his life prolonged in this current  condition.   Extensive conversation about possible disease trajectories, options for care including full treatment vs. Full comfort or even both.   Full Comfort  Discontinue Antibiotics  KVO fluids at low rate per family request until he is transitioned to hospice house and offer oral fluids as tolerated.  Referral to hospice facility for treatment of his agitation and EOL care.  Symptom management:   IV Ativan PRN  IV Morphine PRN  Atropine gtts for UA secretions PRN  Haldol PRN  Foley if needed  for comfort   Time: 120 minutes. 4PM-6PM Greater than 50%  of this time was spent counseling and coordinating care related to the above assessment and plan.  Signed by: Roma Schanz, DO  07/29/2013, 4:50 PM  Please contact Palliative Medicine Team phone at [phone] for questions and concerns.

## 2013-07-29 NOTE — Progress Notes (Signed)
ANTIBIOTIC CONSULT NOTE - Follow up  Pharmacy Consult for Unasyn Indication: aspiration pneumonia  Allergies  Allergen Reactions  . Sulfonamide Derivatives Nausea Only    1957 post knee surgery    Patient Measurements: Height: 5' 8.9" (175 cm) Weight: 155 lb 11.2 oz (70.625 kg) IBW/kg (Calculated) : 70.47  Vital Signs: Temp: 98.4 F (36.9 C) (06/22 0430) Temp src: Axillary (06/22 0430) BP: 93/57 mmHg (06/22 0430) Pulse Rate: 102 (06/22 0430) Intake/Output from previous day: 06/21 0701 - 06/22 0700 In: -  Out: 100 [Urine:100]  Labs:  Recent Labs  07/28/13 1538 07/29/13 0450  WBC 12.8* 10.6*  HGB 12.6* 11.2*  PLT 316 292  CREATININE 0.84 0.94   Estimated Creatinine Clearance: 61.5 ml/min (by C-G formula based on Cr of 0.94).    Anti-infectives: 6/21 >> Zosyn >> 6/22 6/21 >> vancomycin >>  6/22 6/22 >> Unasyn >>  Assessment: 15 YOM presents from NH with lethargy and non-productive cough.  CXR reveals possible R lung infiltrate w/ component of pleural effusion. Pharmacy consulted to dose vancomycin and Zosyn initially, but changed to Unasyn alone for HCAP/aspiration pneumonia and possible UTI.   6/22:  Day #1 Unasyn (D/C vancomycin and Zosyn)  Tmax: 100.5  WBCs: 12.8 > 10.6  Renal: SCr 0.94, CrCl ~ 61 ml/min  Cultures pending.   Goal of Therapy:  Appropriate abx dosing, eradication of infection.   Plan:   Unasyn 3g IV q6h  Follow up renal fxn and culture results.   Gretta Arab PharmD, BCPS Pager [phone] 07/29/2013 2:32 PM

## 2013-07-29 NOTE — Evaluation (Addendum)
Clinical/Bedside Swallow Evaluation Patient Details  Name: Carlos Gonzalez MRN: [id number] Date of Birth: 04/27/1930  Today's Date: 07/29/2013 Time: 1000-1013 SLP Time Calculation (min): 13 min  Past Medical History:  Past Medical History  Diagnosis Date  . Parkinson's disease     Dr Morene Antu  . Hypertension   . Hypothyroidism     Caused by Amiodarone, resolved  . Orthostatic hypotension     Chronic  . Atrial fibrillation   . Chronic anticoagulation   . BPH (benign prostatic hyperplasia)   . Cancer     hx of skin cancer;? basal cell  . Gastritis   . Shortness of breath   . Pneumonia     hx of   . Blood dyscrasia     chronic anticoagulation  . Dementia    Past Surgical History:  Past Surgical History  Procedure Laterality Date  . Hernia repair       X 4 total  . Appendectomy  Age 51    S/P  rupture  . Knee surgery  Age 23  . US echocardiography  July 2012    EF 55-60%  . Cardiovascular stress test  09-04-2003    EF 66%  . Middle ear surgery     HPI:  80 yo male adm to [facility] with AMS, fevers, + cough.  Pt PMH + for Parkinson's disease, dementia, repair of hernia x4.  CXR showed right lobe infiltrate.  Pt underwent MBS in 2011 but results not available.  Swallow eval ordered.    Assessment / Plan / Recommendation Clinical Impression  Pt currently is not appropriate for po intake or instrumental testing due to mental status and overt indications of airway compromise.  Viscous white tinged oral secretions suctioned from pt's mouth after which pt used toothette to moisten.    Following pt swallow of moisture via toothette, delayed weak coughing noted.  Pt stated "I'm fine" with congested/gurgly sounding vocal quality.  Suspect aspiration of secretions and small amount of water via toothette at this point.    SLP phoned facility and spoke to [facility] (Armed forces logistics/support/administrative officer) who reports pt required assistance to do ADLs including bathing/dressing and used a walker for  ambulation.  Recently pt had had several falls per [place].  He was reported by Becky Sax to able to feed himself and was on a regular diet but had poor intake in the last few weeks.  She denied pt having overt signs of difficulty swallowing.     Note plans for palliative referral.  Intake at this time does not appear to be comforting for pt due to severity of dysphagia/airway compromise.      Aspiration Risk  Severe    Diet Recommendation NPO (do not use toothettes)        Other  Recommendations   thanks for palliative referral, suspect prognosis for swallow to be functional level is poor  Follow Up Recommendations       Frequency and Duration min 1 x/week  1 week   Pertinent Vitals/Pain Low grade fever, congested cough      Swallow Study Prior Functional Status   see [place] Date of Onset: 07/29/13 HPI: 80 yo male adm to [facility] with AMS, fevers, + cough.  Pt PMH + for Parkinson's disease, dementia, repair of hernia x4.  CXR showed right lobe infiltrate.  Pt underwent MBS in 2011 but results not available.  Swallow eval ordered.  Type of Study: Bedside swallow evaluation Diet Prior  to this Study: NPO Temperature Spikes Noted: No Respiratory Status: Room air History of Recent Intubation: No Behavior/Cognition: Confused;Agitated;Impulsive;Uncooperative;Doesn't follow directions (pt has dementia) Oral Cavity - Dentition: Edentulous;Missing dentition (few natural dentition) Self-Feeding Abilities: Total assist Patient Positioning: Upright in bed Baseline Vocal Quality: Low vocal intensity Volitional Cough: Cognitively unable to elicit Volitional Swallow: Unable to elicit    Oral/Motor/Sensory Function Overall Oral Motor/Sensory Function:  (gross weakness noted, pt did not follow commands, pt able to demonstrate tonic bite of toothette showing adequate mandibular strength)   Ice Chips Ice chips: Not tested   Thin Liquid Thin Liquid: Impaired Oral Phase Impairments: Reduced  lingual movement/coordination;Impaired anterior to posterior transit Oral Phase Functional Implications: Prolonged oral transit Pharyngeal  Phase Impairments: Suspected delayed Swallow;Cough - Immediate Other Comments: moisture via toothette, weak congested cough noted    Nectar Thick Nectar Thick Liquid: Not tested   Honey Thick Honey Thick Liquid: Not tested   Puree Puree: Not tested   Solid   GO    Solid: Not tested       Luanna Salk, MS [facility] SLP [phone]    SLP spoke to pt's son and daughter in hallway re: current swallow disability and recommendation.  Son inquiring re: management of secretions - advised him to speak to MD re: alternative to decrease and/or manage secretions better. Advised two children not to use toothettes due to risk of pt biting off piece and aspirating.

## 2013-07-29 NOTE — Progress Notes (Signed)
Carlos Gonzalez had an okay , was  A little restless but later fell asleep. Pt was Afib with  run of PVC, Pt remain calm and asymptomatic.  On coming nurse made aware,

## 2013-07-30 LAB — URINE CULTURE
COLONY COUNT: NO GROWTH
Culture: NO GROWTH

## 2013-07-30 MED ORDER — BISACODYL 10 MG RE SUPP: 10.0000 mg | Freq: Every day | RECTAL | Status: AC | PRN

## 2013-07-30 MED ORDER — MORPHINE SULFATE 2 MG/ML IJ SOLN: 2.0000 mg | INTRAMUSCULAR | Status: AC | PRN

## 2013-07-30 MED ORDER — ATROPINE SULFATE 1 % OP SOLN: 2.0000 [drp] | OPHTHALMIC | Status: AC | PRN

## 2013-07-30 MED ORDER — ARTIFICIAL TEARS OP OINT: TOPICAL_OINTMENT | Freq: Every day | OPHTHALMIC | Status: AC

## 2013-07-30 MED ORDER — LORAZEPAM 2 MG/ML IJ SOLN: 1.0000 mg | INTRAMUSCULAR | Status: AC | PRN

## 2013-07-30 NOTE — Progress Notes (Signed)
Clinical Social Work  Patient accepted to [facility]. RN to call report to facility. CSW prepared DC packet with hard scripts, DC summary, and DNR included. Son to complete paperwork at hospice at 1430. CSW informed son of DC plans and that CSW would arrange transportation. Son reports he is thankful for CSW help and that patient will transfer to a facility close to his home. CSW coordinated transportation via [place]. Request #: [id number].  CSW is signing off but available if needed.  [place], [place] [phone]

## 2013-07-30 NOTE — ED Provider Notes (Signed)
Medical screening examination/treatment/procedure(s) were conducted as a shared visit with non-physician practitioner(s) and myself.  I personally evaluated the patient during the encounter.   EKG Interpretation   Date/Time:  Sunday July 28 2013 15:30:42 EDT Ventricular Rate:  102 PR Interval:    QRS Duration: 77 QT Interval:  387 QTC Calculation: 504 R Axis:   60 Text Interpretation:  Atrial fibrillation Ventricular premature complex  Borderline repolarization abnormality Prolonged QT interval Baseline  wander in lead(s) V3 Similar to prior Confirmed by Mingo Amber  MD, [place]  ([id number]) on 07/28/2013 3:38:28 PM      Patient here with confusion. Elderly, demented, from nursing home. Worsening mental status. Here mumbles his name, otherwise noncommunicative. Xray shows RLL PNA, concern for aspiration. Admitted.  Osvaldo Shipper, MD 07/30/13 [phone]

## 2013-07-30 NOTE — Clinical Documentation Improvement (Signed)
  Per 6/22 eval by Registered Dietician "appears to be Severely malnourished related to chronic illness AEB 10% weight loss in the past 6 months, decrease muscle mass and body fat." If you agree with this, please clarify term "malnutrition" found in chart Note to reflect severity of illness and risk of mortality. Thank you.  Possible Clinical Conditions? - Severe Protein Calorie Malnutrition - Other Condition  Supporting Information - % body weight: 90% - advanced dementia - dysphagia with Aspiration PNA and Acute Encephalopathy  Thank You, Ezekiel Ina ,RN Clinical Documentation Specialist:  [place] Information Management

## 2013-07-30 NOTE — Progress Notes (Signed)
SLP Cancellation Note  Patient Details Name: NEILAN RIZZO MRN: [id number] DOB: May 27, 1930   Cancelled treatment:       Reason Eval/Treat Not Completed:  (order dc'd by MD, please reorder if desire, thanks.)   Claudie Fisherman, [facility] SLP [phone]

## 2013-07-30 NOTE — Progress Notes (Signed)
Gave report to Moss Mc, RN at [facility]. Left number if she had additional questions.

## 2013-07-30 NOTE — Progress Notes (Signed)
Clinical Social Work Department BRIEF PSYCHOSOCIAL ASSESSMENT 07/30/2013  Patient:  MAJESTIC, BRISTER     Account Number:  [id number]     Admit date:  07/28/2013  Clinical Social Worker:  Earlie Server  Date/Time:  07/30/2013 09:30 AM  Referred by:  Physician  Date Referred:  07/30/2013 Referred for  Residential hospice placement   Other Referral:   Interview type:  Family Other interview type:   Patient unable to participate in assessment.    PSYCHOSOCIAL DATA Living Status:  FACILITY Admitted from facility:  [facility] Level of care:  Assisted Living Primary support name:  Herbie [place] Primary support relationship to patient:  CHILD, ADULT Degree of support available:   Strong    CURRENT CONCERNS Current Concerns  Post-Acute Placement   Other Concerns:    SOCIAL WORK ASSESSMENT / PLAN CSW received referral in order to complete psychosocial assessment. CSW went to room but no family present and patient is unable to participate in assessment. CSW called patient's son via phone. CSW introduced myself and explained role.    Patient's son reports he is 59 but that his sister is involved with care as well. Son conference called sister so that CSW could talk with both of them at once. Family reports they have met with PMT and agreeable to residential hospice placement. CSW explained process and offered hospice choice. CSW agreeable to leave hospice list in room for family when they come to visit. Son reports he is unsure if they are interested in [facility] vs [facility]. CSW spoke with both facilities who report they have available space. CSW shared this information with son who reports he would prefer [facility]. CSW made referral to Beverlee Nims at [facility] who will contact family re: paperwork.    CSW will continue to follow and will assist as needed.   Assessment/plan status:  Psychosocial Support/Ongoing Assessment of Needs Other assessment/ plan:    Information/referral to community resources:   Hospice choice    PATIENT'S/FAMILY'S RESPONSE TO PLAN OF CARE: Patient unable to participate in assessment. Son and dtr very engaged and reports that they feel they are doing what is best for patient. Son feels torn about making decisions and reports that he glad that sister is helping out. Son and dtr both agreeable to [facility] and has CSW contact information if further needs arise.       [place], [place] [phone]

## 2013-07-30 NOTE — Discharge Summary (Signed)
Physician Discharge Summary  Carlos Gonzalez:[id number] DOB: May 07, 1930 DOA: 07/28/2013  PCP: Suszanne Conners, MD  Admit date: 07/28/2013 Discharge date: 07/30/2013  Time spent: 45 minutes  Recommendations for Outpatient Follow-up:  -Will be discharged to [place]. -Full Comfort care.   Discharge Diagnoses:  Principal Problem:   HCAP (healthcare-associated pneumonia) Active Problems:   Acute encephalopathy   HYPOTHYROIDISM   PARKINSON'S DISEASE   Atrial fibrillation   DYSPHAGIA UNSPECIFIED   BENIGN PROSTATIC HYPERTROPHY, HX OF   Malnutrition   Dementia   Healthcare associated bacterial pneumonia   Aspiration pneumonia   Discharge Condition: Stable  Filed Weights   07/28/13 1737  Weight: 70.625 kg (155 lb 11.2 oz)    History of present illness:  80 year old male resident of [facility], with a history of A. fib not on anticoagulation anymore, parkinson's disease with progressive dementia, recurrent falls with orthostatic hypotension, dysphagia, urinary incontinence who was brought to the ED for cough and altered mental status since today. At baseline he is very demented and ambulates with help of a walker. Patient needs full assistance with all his ADLs. He has progressive dementia and has difficulty identifying his children as well. He has had multiple ED visits for fall. As per daughter and she went to see the patient today he was having cough with phlegm and was not willing to walk around as he normally does. The nursing staff thought that he was more confused and very less alert than he normally is.  There is no history of fever, chills, nausea, vomiting, diaphoresis, abdominal pain or distention, dyspnea or diarrhea.  Course in the ED  Patient had a temperature 100.27F, normal blood pressure and O2 sat in low 90s. Blood work done showed WBC of 12.8 a, hemoglobin 4.6 and platelets of 316. Chemistry was unremarkable.  Initial troponin was negative. INR of 1.39. Digoxin level was up to date, proBNP was elevated to 4600.  UA was suggestive of UTI. Chest x-ray done showed right lung infiltrate with possible pleural effusion.  Blood cultures sent from the ED and patient given an empiric dose of IV vancomycin and cefepime and hospitalist consulted for admission to medical floor.   Hospital Course:   Acute Encephalopathy -2/2 PNA on top of baseline dementia. -Far off from his baseline.  HCAP/Aspirationa PNA/UTI -Family has decided to narrow scope of treatment to comfort measures only. -No further antibiotics or admission to the hospital. DNR. -Ativan/Morphine PRN.  Procedures:  None   Consultations:  Palliative Care  Discharge Instructions     Medication List    STOP taking these medications       aspirin 81 MG chewable tablet     B-complex with vitamin C tablet     carbidopa-levodopa 25-100 MG per tablet  Commonly known as:  SINEMET IR     cholecalciferol 1000 UNITS tablet  Commonly known as:  VITAMIN D     citalopram 20 MG tablet  Commonly known as:  CELEXA     digoxin 0.125 MG tablet  Commonly known as:  LANOXIN     diphenhydramine-acetaminophen 25-500 MG Tabs  Commonly known as:  TYLENOL PM     ENSURE PLUS Liqd     fludrocortisone 0.1 MG tablet  Commonly known as:  FLORINEF     guaifenesin 100 MG/5ML syrup  Commonly known as:  ROBITUSSIN     levothyroxine 50 MCG tablet  Commonly known as:  SYNTHROID, LEVOTHROID  loperamide 2 MG capsule  Commonly known as:  IMODIUM     LORazepam 0.5 MG tablet  Commonly known as:  ATIVAN  Replaced by:  LORazepam 2 MG/ML injection      TAKE these medications       artificial tears Oint ophthalmic ointment  Place into both eyes at bedtime.     atropine 1 % ophthalmic solution  Place 2-4 drops under the tongue every 2 (two) hours as needed (upper airway secretions).     bisacodyl 10 MG suppository  Commonly known as:   DULCOLAX  Place 1 suppository (10 mg total) rectally daily as needed for mild constipation or moderate constipation.     LORazepam 2 MG/ML injection  Commonly known as:  ATIVAN  Inject 0.5 mLs (1 mg total) into the vein every 4 (four) hours as needed for anxiety.     morphine 2 MG/ML injection  Inject 1 mL (2 mg total) into the vein every hour as needed (Dyspnea, Respirations >25 or signs of distress).     selenium sulfide 1 % Lotn  Commonly known as:  SELSUN  Apply 1 application topically daily. Wash face and scalp every day for seborrheic dermatitis.       Allergies  Allergen Reactions  . Sulfonamide Derivatives Nausea Only    1955 post knee surgery      The results of significant diagnostics from this hospitalization (including imaging, microbiology, ancillary and laboratory) are listed below for reference.    Significant Diagnostic Studies: Dg Chest 2 View  07/29/2013   CLINICAL DATA:  Cough, congestion, shortness of breath  EXAM: CHEST  2 VIEW  COMPARISON:  07/28/2013  FINDINGS: Cardiomediastinal silhouette is stable. Persistent right pleural effusion with right lower lobe atelectasis or infiltrate. Left lung is clear. No pulmonary edema.  IMPRESSION: Persistent moderate right pleural effusion with right lower lobe atelectasis or infiltrate. No pulmonary edema.   Electronically Signed   By: Lahoma Crocker M.D.   On: 07/29/2013 08:51   Ct Head Wo Contrast  07/28/2013   CLINICAL DATA:  80 year old male with altered mental status.  EXAM: CT HEAD WITHOUT CONTRAST  TECHNIQUE: Contiguous axial images were obtained from the base of the skull through the vertex without intravenous contrast.  COMPARISON:  07/21/2013 and multiple prior head CTs  FINDINGS: Mild chronic small-vessel white matter ischemic changes again noted.  No acute intracranial abnormalities are identified, including mass lesion or mass effect, hydrocephalus, extra-axial fluid collection, midline shift, hemorrhage, or acute  infarction.  The visualized bony calvarium is unremarkable.  IMPRESSION: No evidence of acute intracranial abnormality.  Mild chronic small-vessel white matter ischemic changes.   Electronically Signed   By: Hassan Rowan M.D.   On: 07/28/2013 17:11   Ct Head Wo Contrast  07/21/2013   CLINICAL DATA:  Pain.  EXAM: CT HEAD WITHOUT CONTRAST  CT CERVICAL SPINE WITHOUT CONTRAST  TECHNIQUE: Multidetector CT imaging of the head and cervical spine was performed following the standard protocol without intravenous contrast. Multiplanar CT image reconstructions of the cervical spine were also generated.  COMPARISON:  Head CT 06/28/2013 and cervical spine CT 06/16/2013  FINDINGS: CT HEAD FINDINGS  There is no evidence of acute cortical infarct, intracranial hemorrhage, mass, midline shift, or extra-axial fluid collection. Ventricles and sulci are within normal limits for age. Periventricular white matter hypodensities are unchanged and compatible with mild chronic small vessel ischemic disease. Trace right mastoid fluid is present. Paranasal sinuses are clear. No acute skull fracture is identified.  Prior bilateral cataract extraction is noted. Mild bilateral carotid siphon atherosclerotic calcification is noted.  CT CERVICAL SPINE FINDINGS  There is unchanged reversal of the normal cervical lordosis and slight anterolisthesis of C3 and C4. Moderate multilevel disc space narrowing, endplate spurring, and facet arthrosis are again seen. Disc osteophyte complexes at C4-5 and C5-6 mildly narrow the spinal canal. No acute cervical spine fracture is identified. Prevertebral soft tissues are within normal limits.  IMPRESSION: 1. No evidence of acute intracranial abnormality. 2. Unchanged appearance of the cervical spine without evidence of acute osseous abnormality.   Electronically Signed   By: Logan Bores   On: 07/21/2013 09:58   Ct Cervical Spine Wo Contrast  07/21/2013   CLINICAL DATA:  Pain.  EXAM: CT HEAD WITHOUT CONTRAST  CT  CERVICAL SPINE WITHOUT CONTRAST  TECHNIQUE: Multidetector CT imaging of the head and cervical spine was performed following the standard protocol without intravenous contrast. Multiplanar CT image reconstructions of the cervical spine were also generated.  COMPARISON:  Head CT 06/28/2013 and cervical spine CT 06/16/2013  FINDINGS: CT HEAD FINDINGS  There is no evidence of acute cortical infarct, intracranial hemorrhage, mass, midline shift, or extra-axial fluid collection. Ventricles and sulci are within normal limits for age. Periventricular white matter hypodensities are unchanged and compatible with mild chronic small vessel ischemic disease. Trace right mastoid fluid is present. Paranasal sinuses are clear. No acute skull fracture is identified. Prior bilateral cataract extraction is noted. Mild bilateral carotid siphon atherosclerotic calcification is noted.  CT CERVICAL SPINE FINDINGS  There is unchanged reversal of the normal cervical lordosis and slight anterolisthesis of C3 and C4. Moderate multilevel disc space narrowing, endplate spurring, and facet arthrosis are again seen. Disc osteophyte complexes at C4-5 and C5-6 mildly narrow the spinal canal. No acute cervical spine fracture is identified. Prevertebral soft tissues are within normal limits.  IMPRESSION: 1. No evidence of acute intracranial abnormality. 2. Unchanged appearance of the cervical spine without evidence of acute osseous abnormality.   Electronically Signed   By: Logan Bores   On: 07/21/2013 09:58   Dg Chest Portable 1 View  07/28/2013   CLINICAL DATA:  COUGH  EXAM: PORTABLE CHEST - 1 VIEW  COMPARISON:  Two-view chest 01/17/2012.  FINDINGS: Visualized course of cardiac silhouette stable. Atherosclerotic calcifications of the aorta. There is diffuse increased density extending from the right mid lung to the lung base. The more dependent portions have a consolidative appearance with linear areas of density superiorly. Left hemithorax is  clear. No acute osseous abnormalities. Degenerative changes within the shoulders.  IMPRESSION: Density in the right lung differential considerations are infiltrate with a component of a pleural effusion. Repeat evaluation with PA and lateral chest radiograph recommended.   Electronically Signed   By: Margaree Mackintosh M.D.   On: 07/28/2013 15:45    Microbiology: Recent Results (from the past 240 hour(s))  CULTURE, BLOOD (ROUTINE X 2)     Status: None   Collection Time    07/28/13  5:03 PM      Result Value Ref Range Status   Specimen Description BLOOD RIGHT WRIST  3 ML IN AEROBIC ONLY   Final   Special Requests Normal   Final   Culture  Setup Time     Final   Value: 07/28/2013 22:01     Performed at [facility]   Culture     Final   Value:        BLOOD CULTURE RECEIVED NO GROWTH  TO DATE CULTURE WILL BE HELD FOR 5 DAYS BEFORE ISSUING A FINAL NEGATIVE REPORT     Performed at [facility]   Report Status PENDING   Incomplete  CULTURE, BLOOD (ROUTINE X 2)     Status: None   Collection Time    07/28/13  5:03 PM      Result Value Ref Range Status   Specimen Description BLOOD RIGHT HAND  5 ML IN [facility] BOTTLE   Final   Special Requests Normal   Final   Culture  Setup Time     Final   Value: 07/28/2013 22:01     Performed at [facility]   Culture     Final   Value:        BLOOD CULTURE RECEIVED NO GROWTH TO DATE CULTURE WILL BE HELD FOR 5 DAYS BEFORE ISSUING A FINAL NEGATIVE REPORT     Performed at [facility]   Report Status PENDING   Incomplete  URINE CULTURE     Status: None   Collection Time    07/28/13  8:06 PM      Result Value Ref Range Status   Specimen Description URINE, CLEAN CATCH   Final   Special Requests NONE   Final   Culture  Setup Time     Final   Value: 07/29/2013 04:51     Performed at [facility] Count     Final   Value: NO GROWTH     Performed at [facility]   Culture     Final   Value: NO GROWTH      Performed at [facility]   Report Status 07/30/2013 FINAL   Final  MRSA PCR SCREENING     Status: None   Collection Time    07/29/13  5:52 AM      Result Value Ref Range Status   MRSA by PCR NEGATIVE  NEGATIVE Final   Comment:            The GeneXpert MRSA Assay (FDA     approved for NASAL specimens     only), is one component of a     comprehensive MRSA colonization     surveillance program. It is not     intended to diagnose MRSA     infection nor to guide or     monitor treatment for     MRSA infections.     Labs: Basic Metabolic Panel:  Recent Labs Lab 07/28/13 1538 07/29/13 0450  NA 141 141  K 4.2 3.9  CL 103 106  CO2 24 24  GLUCOSE 103* 114*  BUN 22 24*  CREATININE 0.84 0.94  CALCIUM 8.3* 8.0*   Liver Function Tests:  Recent Labs Lab 07/28/13 1538  AST 31  ALT 7  ALKPHOS 116  BILITOT 1.5*  PROT 6.2  ALBUMIN 2.3*   No results found for this basename: LIPASE, AMYLASE,  in the last 168 hours No results found for this basename: AMMONIA,  in the last 168 hours CBC:  Recent Labs Lab 07/28/13 1538 07/29/13 0450  WBC 12.8* 10.6*  NEUTROABS 11.0*  --   HGB 12.6* 11.2*  HCT 37.6* 34.3*  MCV 90.8 92.2  PLT 316 292   Cardiac Enzymes:  Recent Labs Lab 07/28/13 1538  TROPONINI <0.30   BNP: BNP (last 3 results)  Recent Labs  07/28/13 1538  PROBNP 4617.0*   CBG: No results found for this basename: GLUCAP,  in the last 168 hours     Signed:  Lelon Frohlich  [facility] Pager: [phone] 07/30/2013, 10:46 AM

## 2013-08-03 LAB — CULTURE, BLOOD (ROUTINE X 2)
Culture: NO GROWTH
Culture: NO GROWTH
SPECIAL REQUESTS: NORMAL
Special Requests: NORMAL

## 2013-08-05 ENCOUNTER — Ambulatory Visit: Payer: Self-pay | Admitting: Cardiology

## 2013-08-07 DEATH — deceased

## 2014-06-17 ENCOUNTER — Encounter: Payer: Self-pay | Admitting: Gastroenterology

## 2015-04-28 IMAGING — CR DG CHEST 1V PORT
1 series · 2 of 2 positions shown · non-contrast
Comparison: Two-view chest 01/17/2012.

CLINICAL DATA: COUGH

EXAM:
PORTABLE CHEST - 1 VIEW

[Series 1: AP · U · 2 of 2 slices shown]
[im 1/2]
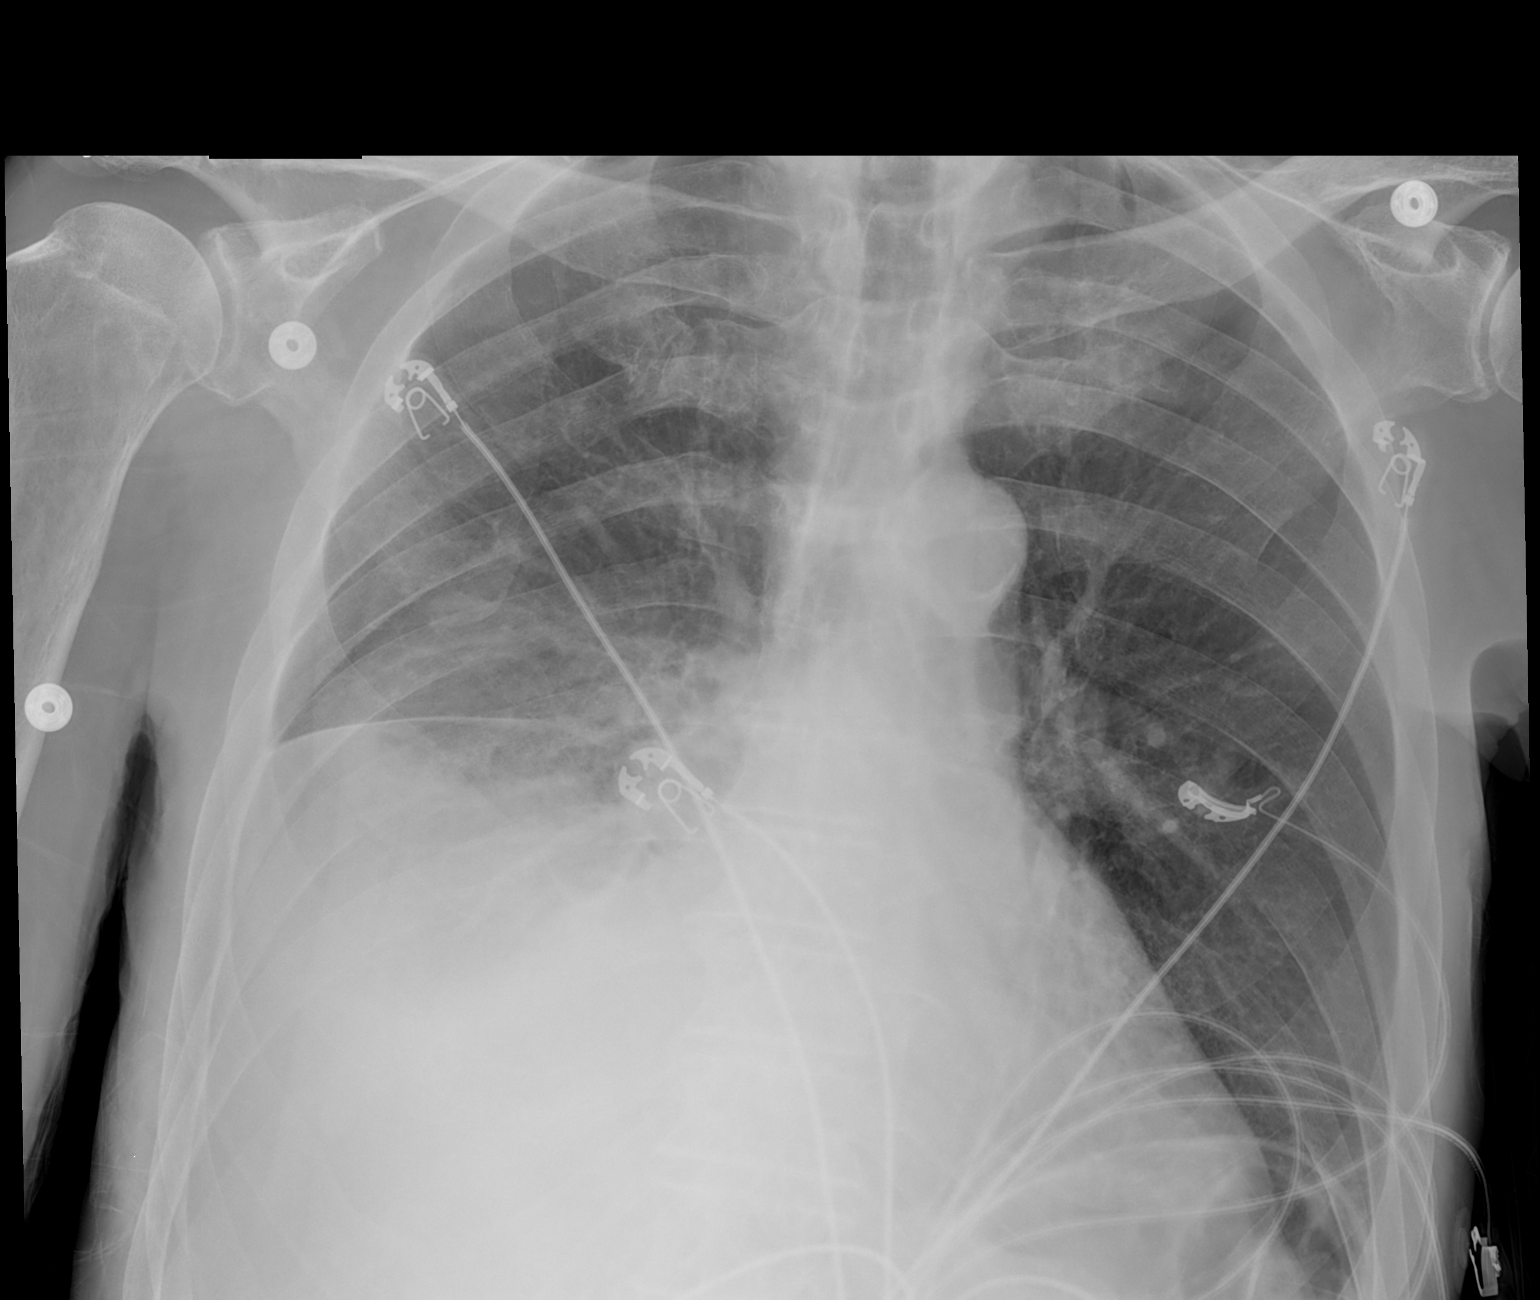
[im 2/2]
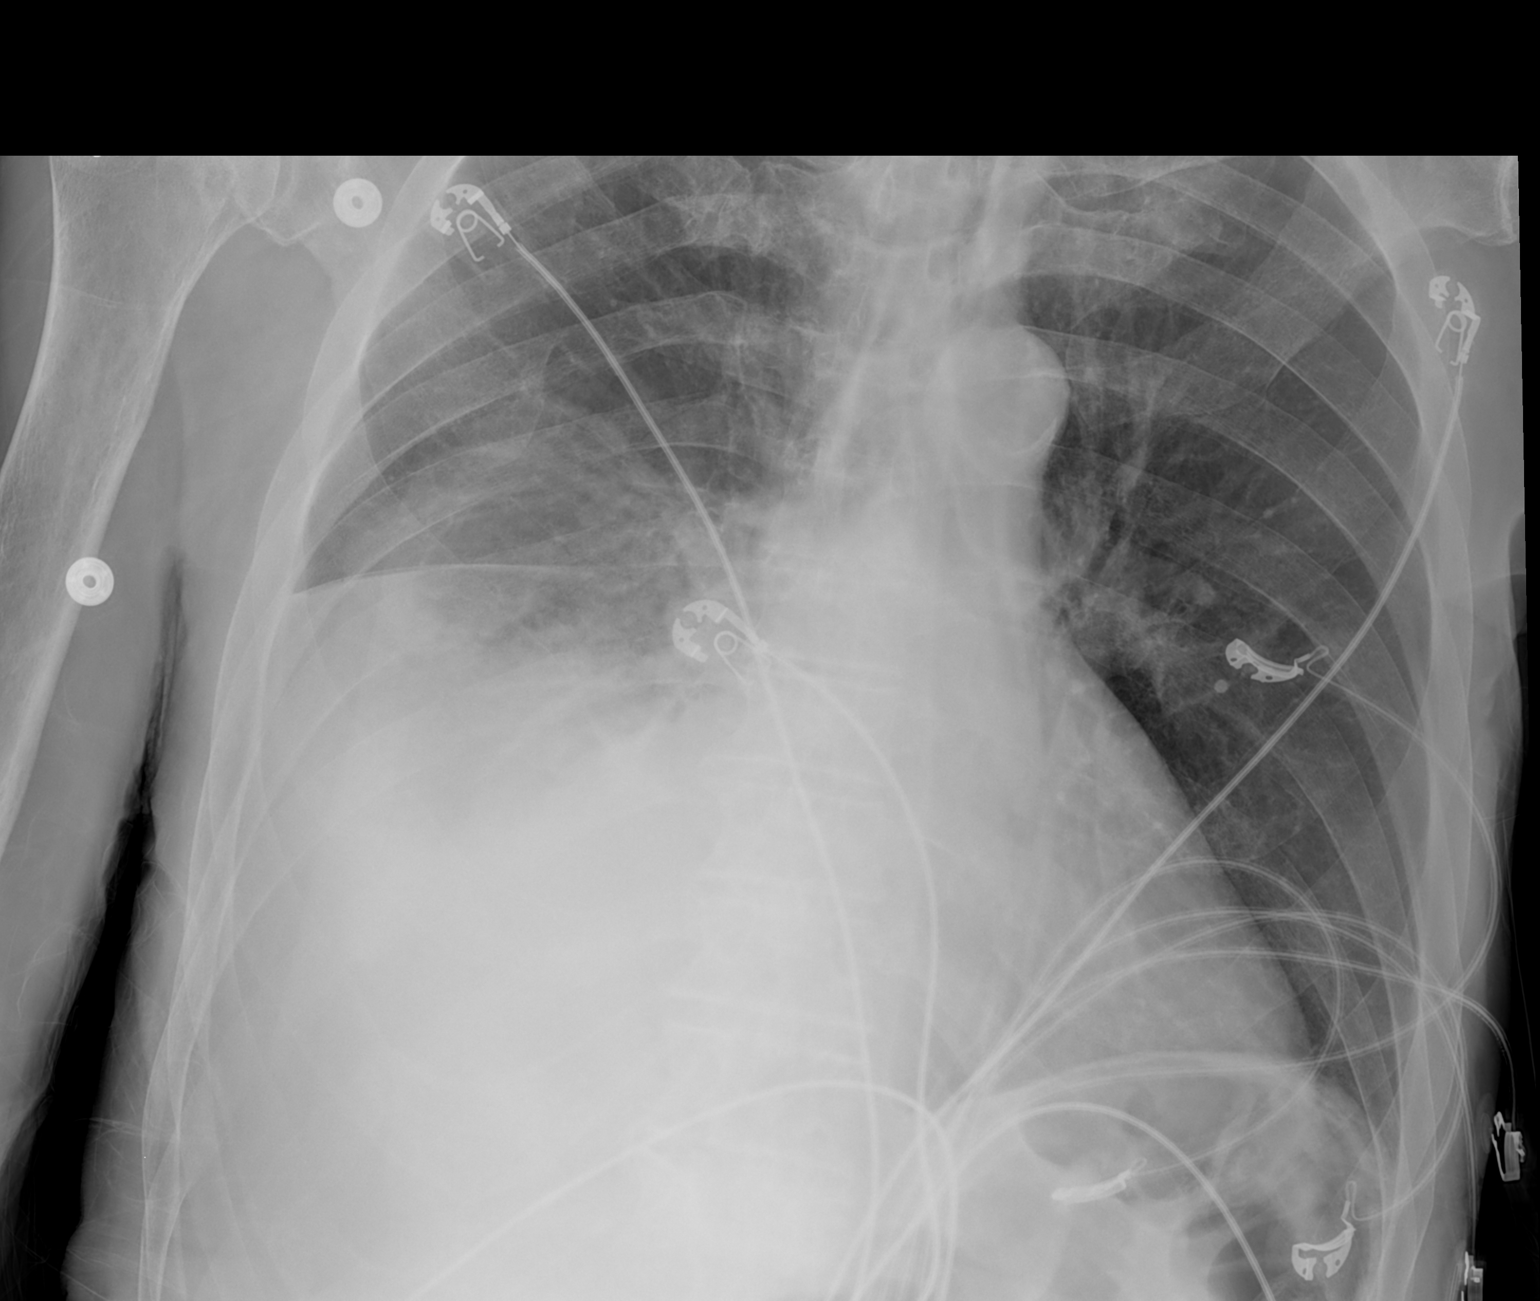

[2 of 2 positions shown; findings below may reference images not displayed]

FINDINGS: Visualized course of cardiac silhouette stable. Atherosclerotic
calcifications of the aorta. There is diffuse increased density
extending from the right mid lung to the lung base. The more
dependent portions have a consolidative appearance with linear areas
of density superiorly. Left hemithorax is clear. No acute osseous
abnormalities. Degenerative changes within the shoulders.
IMPRESSION: Density in the right lung differential considerations are infiltrate
with a component of a pleural effusion. Repeat evaluation with PA
and lateral chest radiograph recommended.
